# Patient Record
Sex: Male | Born: 1945 | Race: White | Hispanic: No | Smoking: Current every day smoker
Health system: Southern US, Community
[De-identification: ages and names within clinical notes are randomized; demographics above are authoritative.]

## PROBLEM LIST (undated history)

## (undated) DIAGNOSIS — E785 Hyperlipidemia, unspecified: Secondary | ICD-10-CM

## (undated) DIAGNOSIS — R7989 Other specified abnormal findings of blood chemistry: Secondary | ICD-10-CM

## (undated) DIAGNOSIS — I255 Ischemic cardiomyopathy: Secondary | ICD-10-CM

## (undated) DIAGNOSIS — Z609 Problem related to social environment, unspecified: Secondary | ICD-10-CM

## (undated) DIAGNOSIS — I350 Nonrheumatic aortic (valve) stenosis: Secondary | ICD-10-CM

## (undated) DIAGNOSIS — E119 Type 2 diabetes mellitus without complications: Secondary | ICD-10-CM

## (undated) DIAGNOSIS — Z9119 Patient's noncompliance with other medical treatment and regimen: Secondary | ICD-10-CM

## (undated) DIAGNOSIS — Z91199 Patient's noncompliance with other medical treatment and regimen due to unspecified reason: Secondary | ICD-10-CM

## (undated) DIAGNOSIS — I619 Nontraumatic intracerebral hemorrhage, unspecified: Secondary | ICD-10-CM

## (undated) DIAGNOSIS — I2510 Atherosclerotic heart disease of native coronary artery without angina pectoris: Secondary | ICD-10-CM

## (undated) DIAGNOSIS — I639 Cerebral infarction, unspecified: Secondary | ICD-10-CM

## (undated) DIAGNOSIS — I484 Atypical atrial flutter: Secondary | ICD-10-CM

## (undated) DIAGNOSIS — I10 Essential (primary) hypertension: Secondary | ICD-10-CM

## (undated) DIAGNOSIS — Z659 Problem related to unspecified psychosocial circumstances: Secondary | ICD-10-CM

## (undated) HISTORY — PX: CORONARY ARTERY BYPASS GRAFT: SHX141

## (undated) SURGERY — CYSTOSCOPY, FLEXIBLE
Anesthesia: LOCAL

---

## 2002-11-27 ENCOUNTER — Inpatient Hospital Stay (HOSPITAL_COMMUNITY): Admission: AD | Admit: 2002-11-27 | Discharge: 2002-12-10 | Payer: Self-pay | Admitting: Cardiology

## 2002-11-27 ENCOUNTER — Encounter: Payer: Self-pay | Admitting: *Deleted

## 2002-11-27 ENCOUNTER — Encounter (INDEPENDENT_AMBULATORY_CARE_PROVIDER_SITE_OTHER): Payer: Self-pay | Admitting: Cardiology

## 2002-12-13 ENCOUNTER — Emergency Department (HOSPITAL_COMMUNITY): Admission: EM | Admit: 2002-12-13 | Discharge: 2002-12-14 | Payer: Self-pay | Admitting: *Deleted

## 2002-12-16 ENCOUNTER — Emergency Department (HOSPITAL_COMMUNITY): Admission: EM | Admit: 2002-12-16 | Discharge: 2002-12-16 | Payer: Self-pay | Admitting: Emergency Medicine

## 2002-12-28 ENCOUNTER — Encounter
Admission: RE | Admit: 2002-12-28 | Discharge: 2002-12-28 | Payer: Self-pay | Admitting: Thoracic Surgery (Cardiothoracic Vascular Surgery)

## 2003-06-21 ENCOUNTER — Emergency Department (HOSPITAL_COMMUNITY): Admission: EM | Admit: 2003-06-21 | Discharge: 2003-06-21 | Payer: Self-pay | Admitting: Emergency Medicine

## 2004-07-10 ENCOUNTER — Encounter: Payer: Self-pay | Admitting: Emergency Medicine

## 2004-07-10 ENCOUNTER — Inpatient Hospital Stay (HOSPITAL_COMMUNITY): Admission: AD | Admit: 2004-07-10 | Discharge: 2004-07-14 | Payer: Self-pay | Admitting: Cardiovascular Disease

## 2005-06-18 ENCOUNTER — Ambulatory Visit (HOSPITAL_COMMUNITY): Admission: RE | Admit: 2005-06-18 | Discharge: 2005-06-18 | Payer: Self-pay | Admitting: Family Medicine

## 2005-07-22 ENCOUNTER — Emergency Department (HOSPITAL_COMMUNITY): Admission: EM | Admit: 2005-07-22 | Discharge: 2005-07-22 | Payer: Self-pay | Admitting: Emergency Medicine

## 2005-09-21 ENCOUNTER — Emergency Department (HOSPITAL_COMMUNITY): Admission: EM | Admit: 2005-09-21 | Discharge: 2005-09-21 | Payer: Self-pay | Admitting: Emergency Medicine

## 2006-01-07 ENCOUNTER — Emergency Department (HOSPITAL_COMMUNITY): Admission: EM | Admit: 2006-01-07 | Discharge: 2006-01-07 | Payer: Self-pay | Admitting: Emergency Medicine

## 2006-12-30 ENCOUNTER — Ambulatory Visit (HOSPITAL_COMMUNITY): Admission: RE | Admit: 2006-12-30 | Discharge: 2006-12-30 | Payer: Self-pay | Admitting: Family Medicine

## 2007-05-28 ENCOUNTER — Encounter: Payer: Self-pay | Admitting: Orthopedic Surgery

## 2007-06-16 ENCOUNTER — Ambulatory Visit (HOSPITAL_COMMUNITY): Admission: RE | Admit: 2007-06-16 | Discharge: 2007-06-16 | Payer: Self-pay | Admitting: Orthopaedic Surgery

## 2007-06-19 ENCOUNTER — Ambulatory Visit (HOSPITAL_COMMUNITY): Admission: RE | Admit: 2007-06-19 | Discharge: 2007-06-19 | Payer: Self-pay | Admitting: Family Medicine

## 2007-07-02 ENCOUNTER — Emergency Department (HOSPITAL_COMMUNITY): Admission: EM | Admit: 2007-07-02 | Discharge: 2007-07-02 | Payer: Self-pay | Admitting: Emergency Medicine

## 2008-07-05 ENCOUNTER — Emergency Department (HOSPITAL_COMMUNITY): Admission: EM | Admit: 2008-07-05 | Discharge: 2008-07-05 | Payer: Self-pay | Admitting: Emergency Medicine

## 2009-08-18 ENCOUNTER — Emergency Department (HOSPITAL_COMMUNITY): Admission: EM | Admit: 2009-08-18 | Discharge: 2009-08-18 | Payer: Self-pay | Admitting: Emergency Medicine

## 2009-08-19 ENCOUNTER — Emergency Department (HOSPITAL_COMMUNITY): Admission: EM | Admit: 2009-08-19 | Discharge: 2009-08-19 | Payer: Self-pay | Admitting: Emergency Medicine

## 2010-01-21 ENCOUNTER — Encounter: Payer: Self-pay | Admitting: Thoracic Surgery (Cardiothoracic Vascular Surgery)

## 2010-03-16 LAB — CBC
HCT: 41.2 % (ref 39.0–52.0)
Hemoglobin: 13.9 g/dL (ref 13.0–17.0)
MCV: 91.4 fL (ref 78.0–100.0)
RBC: 4.51 MIL/uL (ref 4.22–5.81)
RDW: 14.7 % (ref 11.5–15.5)
WBC: 10.6 10*3/uL — ABNORMAL HIGH (ref 4.0–10.5)

## 2010-03-16 LAB — COMPREHENSIVE METABOLIC PANEL
ALT: 23 U/L (ref 0–53)
Alkaline Phosphatase: 63 U/L (ref 39–117)
BUN: 15 mg/dL (ref 6–23)
CO2: 26 mEq/L (ref 19–32)
Chloride: 106 mEq/L (ref 96–112)
Glucose, Bld: 127 mg/dL — ABNORMAL HIGH (ref 70–99)
Potassium: 4 mEq/L (ref 3.5–5.1)
Sodium: 140 mEq/L (ref 135–145)
Total Bilirubin: 0.8 mg/dL (ref 0.3–1.2)
Total Protein: 6.3 g/dL (ref 6.0–8.3)

## 2010-03-16 LAB — RAPID URINE DRUG SCREEN, HOSP PERFORMED
Cocaine: POSITIVE — AB
Opiates: POSITIVE — AB
Tetrahydrocannabinol: NOT DETECTED

## 2010-03-16 LAB — POCT CARDIAC MARKERS
CKMB, poc: 2 ng/mL (ref 1.0–8.0)
Troponin i, poc: <0.05 ng/mL (ref 0.00–0.09)

## 2010-03-16 LAB — DIFFERENTIAL
Basophils Absolute: 0.1 10*3/uL (ref 0.0–0.1)
Basophils Relative: 1 % (ref 0–1)
Eosinophils Absolute: 0.6 10*3/uL (ref 0.0–0.7)
Monocytes Relative: 8 % (ref 3–12)
Neutro Abs: 7.3 10*3/uL (ref 1.7–7.7)
Neutrophils Relative %: 69 % (ref 43–77)

## 2010-03-16 LAB — URINALYSIS, ROUTINE W REFLEX MICROSCOPIC
Bilirubin Urine: NEGATIVE
Glucose, UA: NEGATIVE mg/dL
Hgb urine dipstick: NEGATIVE
Specific Gravity, Urine: 1.03 (ref 1.005–1.030)
Urobilinogen, UA: 0.2 mg/dL (ref 0.0–1.0)

## 2010-03-16 LAB — GLUCOSE, CAPILLARY: Glucose-Capillary: 149 mg/dL — ABNORMAL HIGH (ref 70–99)

## 2010-03-16 LAB — ETHANOL: Alcohol, Ethyl (B): <5 mg/dL (ref 0–10)

## 2010-04-09 LAB — GLUCOSE, CAPILLARY: Glucose-Capillary: 92 mg/dL (ref 70–99)

## 2010-05-19 NOTE — Cardiovascular Report (Signed)
NAMENASIR, BRIGHT NO.:  [id number]   MEDICAL RECORD NO.:  [id number]          PATIENT TYPE:  INP   LOCATION:  6527                         FACILITY:  [facility]   PHYSICIAN:  Nicki Guadalajara, M.D.     DATE OF BIRTH:  [birth year]   DATE OF PROCEDURE:  07/13/2004  DATE OF DISCHARGE:                              CARDIAC CATHETERIZATION   PROCEDURE:  Difficult but successful percutaneous transluminal coronary  angioplasty/stenting of the proximal right coronary artery.   INDICATIONS:  Mr. Destine Ambroise is a 65 year old gentleman who has history  of known CAD, status post CABG surgery x4 in November 2004, with a LIMA to  the LAD, vein to the diagonal, vein to the marginal, and vein to the PDA  branch of the right coronary artery.  Ejection fraction at that time was  40%.  He apparently had done relatively well but presented to [facility] on Monday, July 10, with unstable angina.  He was transferred to  [facility], where he underwent diagnostic catheterization by Dr.  Allyson Sabal and had a very difficult attempt at intervention to the right coronary  artery proximally.  His vein graft into the right was occluded.  Other  grafts were patent.  He had a poor result with a residual narrowing of at  least 60-70% due to marked vessel tortuosity, calcification and difficult  backup with inability ultimately to dilate beyond a 2.5 mm size.  The films  were reviewed with me, and Dr. Allyson Sabal had requested that I re-attempt  intervention to see if improved angiographic outcome would be able to be  obtained.   PROCEDURE:  After premedication with Versed 2 mg intravenously,the patient  prepped and draped in the usual fashion.  He had been maintained on  Integrilin and was still on Integrilin and Plavix prior to the procedure.  ACT was obtained, and 5000 units of heparin was administered.  A 7-French  system was used and the right femoral artery was punctured anteriorly  with a  7-French sheath inserted.  Initially a right 7-French ART 3.5 guide with  side holes was used.  An Asahi medium wire was advanced into the right  coronary artery, and there was significant difficulty since the wire seemed  to always go up the proximal branch, but ultimately the bend of the wire was  significantly adjusted such that it was able to navigate into the site of  the lesion.  However, with the curve on the wire, it was unable to cross the  lesion, which now appeared at least 90%.  This resulted in poor backup  despite numerous attempts.  Initially, a 2-0, 12 balloon was inserted to try  to give support for the wire to pass the lesion, but again this resulted in  poor backup.  Ultimately the guide was exchanged for a 7-French ART 4.0 with  side holes.  A long Whisper wire was then inserted and ultimately was able  to cross the proximal stenosis.  A 2.0 x 9 mm Maverick balloon was then  inserted and dilated ultimately up to 12 atmospheres.  The balloon was then  exchanged and upgraded to a 2.5 x 9 mm balloon.  The balloon was then able  to go beyond the lesion in the right coronary artery and as a result of  this, it allowed the wire to be manipulated much further down the right  coronary artery such that it was now able to extend into the PLA vessel for  more optimal backup.  Dilatation was then done at the lesion with a 2.5 x 9  mm Maverick balloon up to 10 atmospheres.  At this point, when the balloon  was exchanged, the catheter was obviously providing very poor backup and I  was unable to recannulate the vessel after the whole system had popped back  into the aorta.  The guiding catheter was then exchanged for an AL-1  catheter, which was used at the procedure by Dr. Allyson Sabal the other day.  This  was not soft initially, and consequently the vessel was able to be rewired  down to the distal aspect.  Attempt at stenting was then performed with a 3-  0 x 12 mm Taxus stent,  but this was unable to traverse the proximal area and  consequently the whole system again pulled back.  The patient received an  additional 1000 units of heparin.  At this point, the guide was placed back  in and ultimately a 3.0 x 9 mm Maverick balloon was inserted to attempt more  maximal dilatation of the lesion.  This ultimately was successful in  navigating to the lesion with dilatation up to 3.25 mm.  At this point, the  guidewire was again becoming very difficult to keep in place and ultimately  again, another AL-1 was inserted for more backup prior to attempting  stenting.  Ultimately, a 3.0 x 12 mm Taxus stent with much difficulty was  ultimately able to get to the lesion and was dilated maximally up to 16  atmospheres with the stent balloon.  Post-stent dilatation was initially  attempted with a 3.5 Quantum, but the nose cone of the balloon was  apparently trying to get hung up on the proximal curve at the proximal site  of the stent.  For this reason a Luge wire was advanced down the stent to  create a buddy system.  The balloon was then ultimately able to be placed within the stented  segment, 3.5 x 8 mm Quantum balloon, and the Luge wire was removed with the  balloon remaining over the Whisper wire.  Post-stent dilatation was done  maximally up to 12 atmospheres, corresponding to 3.5 mm size within the  stented segment.  The patient received an additional 1000 units of heparin  at the procedure.  ACT was documented to be therapeutic.  During the  procedure, the patient also received fentanyl 25 mcg x2 for some lower back  muscular discomfort.  He left the catheterization laboratory in stable  condition.   HEMODYNAMIC DATA:  Central aortic pressure was 171/90, mean 122.  During the  procedure the patient received several doses of 200 mcg of intracoronary to  glycerin.   ANGIOGRAPHIC DATA:  The right coronary artery was a large, dominant vessel that supplied that was very  tortuous and calcified proximally.  There was  approximately 40% narrowing in the region of the ostium.  There was least 90% to 95% stenosis at the site of dilatation from two days  ago, suggesting significant elastic recoil in this calcified region.  After  this proximal bend,  there was again mild 30-40% narrowings diffusely  throughout the midportion of the vessel.  The vessel gave rise to a large  PDA and large posterolateral system.  Following successful PTCA with  ultimate stenting, the 90-95% stenosis was reduced to 0% with ultimately a  3.0 x 12 mm drug-eluting paclitaxel Taxus stent being postdilated to 3.5 mm.  There was TIMI-3 flow.  There was no evidence for dissection.   IMPRESSION:  Difficult but successful percutaneous transluminal coronary  angioplasty and stenting of a 90% restenotic lesion in the proximal right  coronary artery with ultimate stenting utilizing a 3.0 x 12 mm Taxus stent  postdilated to 3.5 mm, done with Integrilin/Plavix and heparinization.       TK/MEDQ  D:  07/13/2004  T:  07/13/2004  Job:  [id number]   cc:   Orville Govern   [facility]   Kirk Ruths, M.D.  [address]  [place]  [place] [address]  Fax: [phone]

## 2010-05-19 NOTE — Discharge Summary (Signed)
NAME:  [name], [name]                       ACCOUNT NO.:  [id number]   MEDICAL RECORD NO.:  [id number]                   PATIENT TYPE:  INP   LOCATION:  2013                                 FACILITY:  [facility]   PHYSICIAN:  Salvatore Decent. Dorris Fetch, M.D.         DATE OF BIRTH:  11/02/1945   DATE OF ADMISSION:  11/27/2002  DATE OF DISCHARGE:  12/10/2002                                 DISCHARGE SUMMARY   ADMISSION DIAGNOSIS:  Unstable angina.   PAST MEDICAL HISTORY:  1. Hypertension, quit his medications prior to admission due to finances.  2. History of tobacco use.  3. Questionable history of GERD.   ALLERGIES:  No known drug allergies.   DISCHARGE DIAGNOSES:  1. Coronary artery disease with unstable angina, status post CABG.  2. Postoperative encephalopathy and small frontal hemorrhage, symptoms     resolved.  3. New onset diabetes mellitus type 2, admitting hemoglobin A1c 7.4.   BRIEF HISTORY:  [name] is a 65 year old man.  He presented to the [facility] Emergency Department on November 26 with complaints of chest pain not  relieved by Zantac.  He was given nitroglycerin in the emergency department  and this relieved his chest pain.  He was transferred to [facility]  for cardiac catheterization.   HOSPITAL COURSE:  On January 26 [name] was admitted to [facility] in the care of [facility] and [facility] cardiologist  service.  He was evaluated by Dr. Nicki Guadalajara.  Dr. Tresa Endo recommended  proceeding with cardiac catheterization.  This was performed on January 26  by Dr. Jacinto Halim and revealed severe three-vessel coronary artery disease with  impaired LV function with ejection fraction estimated to be 40%.  FE lesions  were not amenable to PCI.  Cardiac surgery consultation was requested.  He  was evaluated later in the day by Dr. Charlett Lango.  After examination  of the patient and review of the old records, Dr. Dorris Fetch agreed  that  coronary artery bypass grafting was the preferred treatment of choice in  this gentleman.  The procedure, risks and benefits were all discussed with  [name].  He agreed to proceed with surgery.   On November 27 [name] experienced some hypertensive urgency with blood  pressures as high as 184/32.  This was treated with IV nitroglycerin.  His  beta blocker was also increased.  Thyroid studies as well as hemoglobin A1c  studies were ordered.  TSH was returned at a 0.61, this is a normal range,  and his hemoglobin A1c 7.4.  This gave him a new diagnosis of diabetes  mellitus.  For the most part [name] remained stable until the surgery.   November 29 [name] underwent the following surgical procedure with Dr.  Charlett Lango:  Coronary artery bypass grafting x 4.  Grafts placed at  the time of procedure:  Left internal mammary artery graft to the left  anterior descending artery, saphenous vein graft to the diagonal artery,  saphenous vein graft to the first obtuse marginal artery, saphenous vein  graft to the posterior descending artery.  Vein was harvested from the right  thigh with the endovein harvesting technique through the lower leg with an  open surgical technique.  Significant findings at operation include massive  hypertrophy.  [name] tolerated this procedure well, transferring into stable  condition to the SICU.  He remained hemodynamically stable in the immediate  postoperative period.  He did remain sedated on the ventilator overnight, as  he was not following commands as he was waking up.  He was extubated the  morning of postoperative day 1.  [name] did remain with an altered  mental status, including agitation, and was not able to follow commands.  There were initially no focal findings.   On postoperative day 2, head CT scan was obtained as well as neuro  consultation.  Head CT revealed a small area of acute hemorrhage lateral to  the  right caudate.  He was evaluated by Dr. Orlin Hilding, who felt that his  mental status changes were probably secondary to postoperative  encephalopathy, but since there was a small area of hemorrhage, this could  also contribute to his symptoms.  The recommendation was to follow his neuro  exam for any changes and repeat a CT scan in one week.  Over the next 48  hours [name]' mental status improved greatly.   By postoperative day 4, he was more alert, oriented x 2, exam was nonfocal.  He was improving.  He was still lethargic, but there was definite  improvement.  He did begin to develop some coarse rhonchi in his chest as  well as some congestion.  He was started on a course of Avelox for  bronchitis.  Antibiotics were changed while in the intensive care unit and  his blood glucose levels were well controlled.  By the afternoon of  postoperative day 4, [name] was progressing well enough to be  transferred to unit 2000.   December 3, physical therapy evaluation was completed.  Their observation  was that he did very well with ambulation.  He had a steady gait and he was  appropriate at that time for cardiac rehab, phase I.   After transfer to unit 2000, [name] continued to progressively recover  from his surgery.  His heart maintained normal sinus rhythm.  His bronchitis  cleared after several days.  He tolerated his diet.  His incisions were  healing well.  He was evaluated on postoperative day 7 by Dr. Orlin Hilding, who  again recommended a follow-up CT scan prior to discharge.   Once [name] began taking a regular diet his blood glucose levels did  rise.  They were greater than 150 on most days.  He was evaluated by a  diabetes coordinator who recommended starting Glucotrol 5 mg q. d.  This was  started on December 7.  [name] also did have some symptoms of gastric  reflux and he was started on his home medication of Zantac.  December 9 CT scan of the head was  performed.  Review with the radiologist  revealed that a small frontal hemorrhage was resolved.  There was no new  evidence of hemorrhage.  Dr. Orlin Hilding was contacted by phone.  She felt that  [name] would be safe for discharge since his CT scan was stable.  Her  recommendation regarding antiplatelet  therapy was for either aspirin or  Plavix.  [name] was discharged home December 10, 2002.   CONDITION ON DISCHARGE:  Improved.   INSTRUCTIONS ON DISCHARGE:  1. Medications.     A. He is to resume his home medication of Zantac 75 mg daily.  2. New medications.     A. Aspirin 325 mg daily.     B. Lopressor 25 mg b.i.d.     C. Lisinopril 20 mg b.i.d.     D. Norvasc 5 mg q. d.     E. Zocor 40 mg q.h.s.     F. Diazepam 500 mg q.h.s.     G. Glipizide 5 mg q. d.     H. Lasix 40 mg q. d. for 6 days.        A. Potassium chloride 20 mEq q. d. for 6 days     I. (Case management was involved with [name] for assistance with        medications at discharge.)     J. For pain management he has Ultram 50 mg 1 to 2 p.o. q. 6 hours p.r.n.        for pain.  3. Activities - He has been asked to refrain from any driving, any heavy     lifting, pushing or pulling.  He was also instructed to continue his     previous exercises and daily walking.  4. Diet - This should continue to be a heart healthy diet.  5. Wound care - He is to shower daily.  If his incisions show any signs of     infection or fevers greater than 101 degrees Fahrenheit, he is to call     Dr. Sunday Corn office.   FOLLOW UP:  1. Dr. Tresa Endo would like to see him in his office in two weeks.  He is to     call to arrange that appointment.  2. Dr. Dorris Fetch would like to see him in the office on Monday, December     27 at 11 a.m.      Toribio Harbour, N.P.                  Salvatore Decent Dorris Fetch, M.D.    CTK/MEDQ  D:  02/23/2003  T:  02/24/2003  Job:  [id number]   cc:   Salvatore Decent. Dorris Fetch, M.D.  [address]   [place]  [place] [address]   Nicki Guadalajara, M.D.  [phone] [address], [address]  [place], [place] [address]  Fax: [phone]

## 2010-05-19 NOTE — Cardiovascular Report (Signed)
NAMEJEDADIAH, ABDALLAH NO.:  [id number]   MEDICAL RECORD NO.:  [id number]          PATIENT TYPE:  INP   LOCATION:  6527                         FACILITY:  [facility]   PHYSICIAN:  Nanetta Batty, M.D.   DATE OF BIRTH:  [birth year]   DATE OF PROCEDURE:  07/10/2004  DATE OF DISCHARGE:                              CARDIAC CATHETERIZATION   PROCEDURE:  Cardiac catheterization/percutaneous transluminal coronary  angioplasty.   INDICATION:  Mr. Dufrane is a 65 year old gentleman, status post coronary  artery bypass grafting x4, November 2004.  He also has a 4.4-cm abdominal  aortic aneurysm.  His other problems include hypertension, hyperlipidemia,  tobacco abuse and non-insulin-requiring diabetes.  He was admitted today  with progressive dyspnea.  He was awakened with shortness of breath this  morning.  He presented to [facility] and was transferred.  His EKG  did show some new ST and T wave changes.  He presents now for diagnostic  coronary arteriography.   DESCRIPTION OF PROCEDURE:  The patient was brought to the second floor [facility] in the post-absorptive state.  He was premedicated  with p.o. Valium and IV Nubain.  His right groin was prepped and shaved in  usual sterile fashion.  Xylocaine 1% was used for local anesthesia.  A 6-  French sheath was inserted into the right femoral artery using a standard  Seldinger technique.  The 6-French right and left diagnostic catheters as  well as a 6-French pigtail catheter were used for selective coronary  angiography, left ventriculography, selective IMA vein graft angiography,  left ventriculography and supravalvular aortography.  Visipaque dye was used  for the entirety of the case.  Retrograde aortic, left ventricular and  pullback pressures were recorded.   HEMODYNAMIC DATA:  1.  Aortic systolic pressure 139 and diastolic pressure 80.  2.  Left ventricular systolic pressure 144, end-diastolic  pressure 11.   SELECTIVE CORONARY ANGIOGRAPHY:  1.  Left main normal.  2.  LAD:  The LAD had a 70% lesion in the junction between the proximal and      mid-third at the takeoff of the first large septal perforator with      competitive flow in the second diagonal branch and in the distal LAD.  3.  Left circumflex:  The obtuse marginal branch was totally occluded in its      proximal portion.  4.  Coronary artery disease:  A large dominant vessel with 50% ostial      stenosis with damping and 80% proximal stenosis.  5.  Vein graft to the PDA occluded at the origin.  6.  Vein graft to the circumflex marginal patent.  7.  Vein graft to the diagonal patent.  8.  Lumen to the LAD patent.   LEFT VENTRICULOGRAPHY:  RAO left ventriculogram was performed using 25 mL of  Visipaque dye at 12 mL/sec.  The overall LVEF was estimated at approximately  50% to 55% without focal wall motion abnormalities.   SUPRAVALVULAR AORTOGRAPHY:  Supravalvular aortography was performed in the  LAO views using 20 mL of Visipaque dye  at 20 mL/second.  There was no flow  down the RCA vein graft.   IMPRESSION:  Mr. Newby has an occluded posterior descending artery vein  graft with a high-grade proximal dominant right coronary artery lesion.  His  other grafts are intact and his left ventricular function is normal.  We  plan to proceed with percutaneous coronary intervention and stenting using  IIb/IIIa inhibition.   PERCUTANEOUS CORONARY INTERVENTION PROCEDURAL NOTE:  An existing 6-French  sheath in the right femoral artery was exchanged over a wire for a 7-French  sheath.  A 6-French sheath was then inserted into the right femoral vein.  The patient had received aspirin already.  He received 5000 units of heparin  intravenously, 600 mg of p.o. Plavix and Integrilin double-bolus infusion.   Using a 7-French JR4 sidehole guide catheter along with multiple guidewires  including an ASAHI soft Whisper, a luge, and  a BMW, PTCA was performed on  the proximal right using a 2.5/12 Quantum Maverick at nominal pressures.  Following this, attempts were made to pass the CYPHER and TAXUS stent  unsuccessfully around the first bend.  Multiple wires were used in tandem  using buddy wire technique.  Ultimately, the guide was exchanged from an  AL1 7-French sidehole catheter and the cutting balloon atherectomy was  attempted with 2.75 x 6-mm-long cutting balloon; however, this was never  able to navigate the lesion and the guide flipped out of the ostium and we  lost our wire position.  The patient remained stable throughout the case.  Because of the amount of contrast used, the patient's clinical stability and  the fact that the lesion looked stable, the procedure was aborted.  The  ending ACT was 267.  The guidewire and catheters were removed.  The sheaths  were sewn securely in place.  The patient left the lab in stable condition.   He will be hydrated.  He will remain on Integrilin and the sheaths will be  removed once the ACT falls below 150.  I will review the case with my  colleagues and possibly another attempt will be made to intervene on the  proximal RCA.  The patient left the lab in stable condition.       JB/MEDQ  D:  07/10/2004  T:  07/11/2004  Job:  [id number]   cc:   [facility] 2nd Floor Cardiac Cath Lab   [address], [place], [place] [address] [facility]   Kirk Ruths, M.D.  [address]  [employer]  [place] [address]  Fax: [phone]

## 2010-05-19 NOTE — Discharge Summary (Signed)
[name], [name] NO.:  [id number]   MEDICAL RECORD NO.:  [id number]          PATIENT TYPE:  INP   LOCATION:  6527                         FACILITY:  [facility]   PHYSICIAN:  Nicki Guadalajara, M.D.     DATE OF BIRTH:  May 23, 1945   DATE OF ADMISSION:  07/10/2004  DATE OF DISCHARGE:  07/14/2004                                 DISCHARGE SUMMARY   ADMISSION DIAGNOSES:  1.  Recurrent chest pain with abnormal EKG.  2.  Status post coronary bypass grafting times four with ejection fraction      40%.  3.  Noninsulin dependent diabetes.  4.  Hypertension.  5.  Hypercholesterolemia.  6.  Chronic obstructive pulmonary disease with ongoing tobacco use.  7.  Gastroesophageal reflux disease.  8.  History of a right brain cerebrovascular accident.   DISCHARGE DIAGNOSES:  1.  Recurrent coronary artery disease status post coronary artery bypass      graft November, 2004.  2.  Percutaneous coronary intervention and stenting of the proximal right      coronary artery; occluded saphenous vein graft to posterior descending      artery.  3.  Adult onset diabetes mellitus.  4.  Chronic obstructive pulmonary disease with ongoing tobacco use.  5.  History of right brain cerebrovascular accident.  6.  History of hyperlipidemia.   PROCEDURES:  1.  Cardiac catheterization and attempted percutaneous coronary intervention      by Dr. Allyson Sabal on July 10, 2004.  2.  Repeat cath and PCI of the RCA using a TAXUS 3 x 12 mm Monorail stent,      and this was July 13, 2004.   BRIEF HISTORY:  The patient is a 65 year old white male status post CABG x4  November, 2004, with LIMA to the LAD, saphenous vein graft to diagonal,  saphenous vein graft to the OM, saphenous vein graft to the PAD.  His EF  then was 40%.  He has since been seen at [facility] for congestive  heart failure approximately 8 months ago.  Last night he had increasing  shortness of breath at rest, in the ER he had new EKG  changes with inferior  Qs.  He denies chest pain.  Symptoms improved with oxygen.   PAST MEDICAL HISTORY:  Includes:  1.  Noncompliance with his medicines.  2.  History of hypertension.  3.  Hypercholesterolemia.  4.  Peptic ulcer disease.  5.  Diabetes.  6.  Right brain CVA.  7.  Ongoing tobacco use.   MEDICATIONS ON ADMISSION:  1.  Crestor 10 mg daily.  2.  Lasix 40 mg daily.  3.  Potassium 20 mEq daily.  4.  Toprol 50 mg daily.  5.  Lotrel 10/20 daily.  6.  Glucophage 500 mg b.i.d.   ALLERGIES:  NONE KNOWN.   The patient is divorced.  He has one son.  He is a Education administrator.  He smokes 1/2  pack a day.  He denies alcohol.  He is hoping to get disability.   FAMILY HISTORY:  Positive for coronary artery disease.  For further H&P, see the dictated note.   HOSPITAL COURSE:  The patient was admitted and was seen by Dr. Allyson Sabal and  subsequently taken to the Cath Lab on July 10, 2004.  Vein graft to the PDA  was occluded at its origin, its vein graft to the circumflex marginal, vein  graft to the diagonal and lumen of the LAD were patent.  It was Dr. Hazle Coca  opinion the patient had occluded his posterior descending vein graft with a  very high grade proximal dominant RCA lesion.  His other grafts were intact.  Left ventricular function was normal.  He attempted to do a PCI of this area  but was unsuccessful and efforts were discontinued.  He was maintained on  Integrilin and transferred to the floor.  He was taken back to the Cath Lab  on July 13, 2004, at which time he had a successful PCI, angioplasty and  stenting of his proximal right coronary artery.  Patient tolerated the  procedure well, a 90% lesion was reduced and a TAXUS stent was placed.  He  tolerated the procedure well.  The following day he was wheezing some on  forced expirations, there was some ecchymosis at the cath site but overall  he was doing well, and it was Dr. Landry Dyke opinion that he could be  discharged  home.   DISCHARGE MEDS INCLUDE:  1.  Toprol XL 50 mg daily.  2.  Lasix 40 mg one daily.  3.  Potassium 20 mEq one daily.  4.  Lotrel 10/20 one daily.  5.  Glucophage 500 mg one b.i.d., start on July 15, 2004.  6.  Aspirin 325 mg daily.  7.  Plavix 75 mg daily.  8.  Pepcid 20 mg daily.  9.  He was instructed to discontinue smoking again.  10. Nitroglycerin 1/150 SL under the tongue q.5 minutes, call if he needs 3      or more.   DISCHARGE ACTIVITY:  Light for 72 hours, to maintain diabetic low fat diet  and is to call if he has any problems with his calf site.  He is to call Dr.  Regino Schultze for followup with his pulmonary problems.  Dr. Landry Dyke office  scheduled him for followup in 2 weeks in [place].   LABS:  White count on admission was 13.4, on discharge it was 12.6.  Hemoglobin on admission was 14.6 with a hematocrit of 43.  On discharge,  hemoglobin was 13 with a crit of 38.  Platelets were normal on discharge,  showing 34,000.  Electrolytes are normal, BUN is 16, creatinine is 1.2.  On  discharge, his glucoses were all elevated,  glycosylated hemoglobin A1c was 8.8.  CKs and troponins were negative.  Total cholesterol is 131, triglycerides 122, HDL was 35, LDL was 72.  TSH  was 0.251 on July 10, 2004, repeat on July 12, 2004, was 0.402.   CONDITION ON DISCHARGE:  Improved.      Eber Hong, P.A.    ______________________________  Nicki Guadalajara, M.D.    WDJ/MEDQ  D:  09/12/2004  T:  09/12/2004  Job:  [id number]   cc:   Kirk Ruths, M.D.  [address]  [place]  [place] [address]  Fax: [phone]

## 2010-05-19 NOTE — Op Note (Signed)
NAME:  [name], [name]                       ACCOUNT NO.:  [id number]   MEDICAL RECORD NO.:  [id number]                   PATIENT TYPE:  INP   LOCATION:  2306                                 FACILITY:  [facility]   PHYSICIAN:  Salvatore Decent. Dorris Fetch, M.D.         DATE OF BIRTH:  October 01, 1945   DATE OF PROCEDURE:  11/30/2002  DATE OF DISCHARGE:                                 OPERATIVE REPORT   PREOPERATIVE DIAGNOSES:  Three-vessel coronary disease with unstable angina.   POSTOPERATIVE DIAGNOSES:  Three-vessel coronary disease with unstable  angina.   PROCEDURES:  1. Median sternotomy.  2. Extracorporeal circulation.  3. Coronary artery bypass grafting x 4 (left internal mammary artery to LAD,     saphenous vein graft to second diagonal, saphenous vein graft to first     obtuse marginal, saphenous vein graft to posterior descending),     endoscopic vein harvest right leg.   SURGEON:  Salvatore Decent. Dorris Fetch, M.D.   ASSISTANT:  Toribio Harbour, N.P.   ANESTHESIA:  General.   FINDINGS:  Marked biventricular hypertrophy, diffusely diseased target  vessels, veins small but good quality, mammary good quality.   CLINICAL NOTE:  [name] is a 65 year old gentleman with a history of  tobacco abuse.  He presented with unstable angina and at catheterization,  had three-vessel coronary artery disease with an ejection fraction of 40%.  The patient had no history of diabetes but during his preoperative testing,  was found to have an elevated hemoglobin A1C.  The initial plan for  bilateral mammary arteries then was changed to plan to proceed with single  mammary and vein grafting.  In retrospect, the marked cardiac enlargement,  the right mammary would not have been long enough, even as a free graft to  reach the PD or OM vessels.  The patient's left radial artery had already  been cannulated prior to learning of the results of the hemoglobin A1C.  Mr.  Owens understood and accepted the  risks of coronary artery bypass grafting  and agreed to proceed.   OPERATIVE NOTE:  [name] was brought to the preop holding area on  November 30, 2002.  Lines were placed to monitor arterial, central venous,  and pulmonary arterial pressure by the anesthesia service.  IV antibiotics  were administered.  He was taken to the operating room, anesthetized, and  intubated.  A Foley catheter was placed, and the abdomen, chest, and legs  were prepped and draped in the usual fashion.   A median sternotomy was performed, and the left internal mammary artery was  harvested in the standard fashion.  The left internal mammary artery was a 2  mm good quality conduit.  Simultaneously, an incision was made in the medial  aspect of the right leg at the level of the knee.  The greater saphenous  vein was harvested from the thigh endoscopically and through bridged  incisions in the lower leg.  The  vein was of small caliber but good quality.  The mammary was of good quality.  The patient was fully heparinized prior to  dividing the distal into the mammary artery.   The pericardium was opened.  The ascending aorta was palpated.  There was  palpable atherosclerotic disease at the site used for cannulation.  The  cannula was moved slightly more proximal and to the right on the aorta to  allow cannulation in an area free of disease.  There was no other palpable  atherosclerotic disease in the ascending aorta.  A dual-stage venous cannula  was placed via pursestring suture in the right atrial appendage.  After  confirming adequate anticoagulation with ACT measurement, cardiopulmonary  bypass was instituted, and the patient was cooled to 32 degrees Celsius.  The coronary arteries were inspected. They were diffusely diseased.  Of  note, it was difficult even to expose the circumflex because the heart was  so massively hypertrophied.  The conduits were inspected and cut to length.  A foam pad was placed in  the pericardium to protect the left phrenic nerve.  The temperature probe was placed in the myocardial septum, and a  cardioplegia cannula was placed in the ascending aorta.   The aorta was cross-clamped.  The left ventricle was emptied via the aortic  root bank.  Cardiac arrest then was achieved with a combination of cold  antegrade blood cardioplegia and topical iced saline.  After achieving a  complete diastolic arrest to a myocardial septal temperature of 12 degrees  Celsius, the following distal anastomoses were performed.   First, a reversed saphenous vein graft was placed end-to-side to the  posterior descending and branch of the right coronary.  This was a 1.5 mm  vessel, was diffusely diseased, and only a 1 mm probe would pass distally.  The 1.5 mm probe did pass freely into the distal right coronary proximally.  The vein graft was of good quality, and the anastomosis was performed end-to-  side with a running 7-0 Prolene suture.  There was excellent flow through  the graft.  Cardioplegia was administered, and there was good hemostasis.   Next, a reverse saphenous vein graft was placed end-to-side to the second  diagonal branch of the LAD.  This was the larger of the two diagonal  branches.  It was 1.5 mm in diameter.  It was diffusely diseased but was an  adequate vessel for grafting.  The first diagonal was diffusely diseased and  not an adequate vessel for grafting.  An end-to-side anastomosis was  performed with the saphenous vein to the diagonal.  Again, the vein was of  relatively small caliber but good quality.  There was good flow through the  anastomosis.  Cardioplegia was administered.  There was good hemostasis.   Next, the heart was elevated.  Again, this was a very difficult exposure of  the left circumflex.  The OM 1 vessel was identified.  It was the large, dominant, lateral branch.  A graft was placed end-to-side to this OM 1 which  was a 2 mm vessel, but only a  1.5 mm probe would pass distally because of  diffuse disease.  The vein, again, was of good quality.  The anastomosis was  performed with a running 7-0 Prolene suture.  With administration of  cardioplegia, there was a small leak from the heel which was repaired with a  7-0 Prolene suture.   Next, the left internal mammary artery was brought through a  window in the  pericardium, and the distal end was spatulated.  It was a 2 mm good quality  conduit.  It was anastomosed end-to-side to the LAD which was a 2 mm, fair  quality, diffusely diseased target.  The anastomosis was performed with a  running 8-0 Prolene suture.  At the completion of the mammary to LAD  anastomosis, the bulldog clamp was removed from the mammary artery.  Septal  rewarming was noted.  The bulldog clamp was re-placed.  Additional  cardioplegia was administered to achieve once again adequate septal pooling.   The vein grafts were cut to length.  The cardioplegia cannula was removed  from the ascending aorta, and the proximal vein graft anastomoses were  performed to 4.0 mm punch aortotomies with running 6-0 Prolene sutures.  At  the completion of the final proximal anastomosis, the bulldog clamp was  again removed from the left mammary artery.  Rapid septal rewarming was  noted.  Lidocaine was administered.  The patient was placed in Trendelenburg  position.  The aortic root was de-aired, and then the aortic cross-clamp was  removed.  The total cross-clamp time was 83 minutes.   While the patient was being re-warmed, all proximal and distal anastomoses  were inspected for hemostasis.  Epicardial pacing wires were placed on the  right ventricle and right atrium.  After the patient had reached a core  temperature of 37 degrees Celsius, a dopamine drip was initiated.  Then the  patient was DDD paced for complete heart block.  Initially, as the patient  was being removed from bypass at half-flow, there was a poor pulse  pressure.  Therefore, the patient was placed back on bypass and allowed to rest.  A  small amount of air was nodule in the vein graft which was aspirated.  After  resting for 10 minutes, the patient then was removed from cardiopulmonary  bypass on intermediate dose dopamine drip.  He was in sinus rhythm at this  point and did not require pacing.  The patient initially was sluggish with  low cardiac output on initial check; however, after several minutes, the  heart became more vigorous, and the cardiac index was greater than 2, and  the patient remained hemodynamically stable thereafter.  The total bypass  time was 159 minutes.   A test dose of protamine was administered and was well-tolerated.  The  atrial and aortic cannulae were removed.  There remainder of the protamine  was administered without incident.  The chest with irrigated with one liter of warm normal saline containing 1 g of vancomycin.  Hemostasis was  achieved.  The pericardium was reapproximated with interrupted 3-0 silk  sutures.  It came together easily without tension.  A left pleural and two  mediastinal chest tubes were placed through separate subcostal incisions.  The sternum was closed with heavy gauge interrupted stainless steel wires.  The remainder of the incisions were closed in standard fashion with  subcuticular skin closures.  All sponge, needle, and instrument counts were  correct at the end of the procedure.  The patient was taken from the  operating room to the surgical intensive care unit in critical but stable  condition.                                               Salvatore Decent Dorris Fetch,  M.D.    SCH/MEDQ  D:  11/30/2002  T:  11/30/2002  Job:  [id number]   cc:   Nicki Guadalajara, M.D.  [phone] [address]  [place], [place] [address]  Fax: [phone]   Kirk Ruths, M.D.  [address]  [place]  [place] [address]  Fax: [phone]

## 2010-05-19 NOTE — Consult Note (Signed)
NAME:  [name], [name]                       ACCOUNT NO.:  [id number]   MEDICAL RECORD NO.:  [id number]                   PATIENT TYPE:  INP   LOCATION:  4739                                 FACILITY:  [facility]   PHYSICIAN:  Salvatore Decent. Dorris Fetch, M.D.         DATE OF BIRTH:  [birth year]   DATE OF CONSULTATION:  11/27/2002  DATE OF DISCHARGE:                                   CONSULTATION   REASON FOR CONSULTATION:  Three vessel coronary disease.   HISTORY OF PRESENT ILLNESS:  [name] is a 65 year old white male.  He  has no prior cardiac history.  He does have a long standing history of  ulcers, which has been relieved with Zantac in the past.  Wednesday he  developed substernal chest discomfort.  This was a severe pressure.  He  tried taking Zantac with no relief.  He called the EMTs and was taken to  [facility] Emergency Room.  He was given nitroglycerin which did result in  relief of his pain.  He did have any shortness of breath, nausea and  vomiting, or diaphoresis in association with the pain and it did not  radiate.  He was noted to be hypertensive and he did has a history of  hypertension but has stopped his medication due some financial difficulties.  He also had an elevated BMP level on admission.  His initial CPKs there were  a total CPK of 57, MB of 4.5.  His troponin was 0.08.  BMP was 481.  Today  he was transferred to [facility] and underwent cardiac catheterization,  where he was found to have severe three vessel disease.  His ejection  fraction was 40% with no significant mitral regurgitation.  He had inferior  apical hypokinesis.  His EDP was 18.  He did have some aortic root  dilatation, which was mild.  There was no aortic insufficiency.  He also had  aortic ectasia diffusely.   [name] is currently pain free on heparin and nitroglycerin drips.   PAST MEDICAL HISTORY:  1. Hypertension.  He stopped his medication secondary to financial concerns.  2.  History of ulcers and/or reflux treated empirically with Zantac.  3. History of tobacco abuse.  4. No prior history of hypercholesterolemia or cardiac diabetes.  He denies     diabetes or stroke.   MEDICATIONS AT THE TIME OF ADMISSION:  None.   ALLERGIES:  No known drug allergies.   FAMILY HISTORY:  Both father and a brother have had MIs.   SOCIAL HISTORY:  He is divorced.  He lives with his mother who is 10 years  old and in good health.  He has one son who lives in [place].  He does  have family in the local area.  He works as a Museum/gallery exhibitions officer.  He smoked  one-two packs a day for 40 years.  He currently is smoking approximately 1/2  pack a day.  REVIEW OF SYSTEMS:  He does have some indigestion symptoms usually relieved  by Zantac.  He has not had any excessive thirst or urination.  He has not  had any stroke or TIA symptoms.  He has not any tendency to bleed or bruise  easily.  He denies any exertional chest pain prior to this admission.  All  other systems are negative.   PHYSICAL EXAMINATION:  GENERAL:  [name] is a 65 year old white male in  no acute distress.  He is well-developed and well-nourished.  HEENT:  Remarkable for extremely poor dentition.  He has loose incisors  bilaterally.  On the upper he is missing multiple teeth.  There is  gingivitis but no obvious abscesses.  NECK:  Supple without thyromegaly or adenopathy.  LUNGS:  Equal breath sounds bilaterally.  There is no rales or wheezes.  CARDIAC:  Regular rate and rhythm.  Normal S1 and S2.  No murmurs, rubs, or  gallops.  ABDOMEN:  Soft and nontender.  EXTREMITIES:  Without clubbing, cyanosis, or edema.  He has no varicosities.  He has 2+ posterior tibial pulses bilaterally and 2+ radial pulses  bilaterally.   LABORATORY DATA:  EKG shows normal sinus rhythm with Q waves in 3 and AVF.  T wave in version in 1 and AVL.  His white count was 12.5, hematocrit 50,  platelets 286.  Sodium 136, potassium 3.7,  BUN and creatinine 15 and 1.0.  His glucose was 158 non-fasting on admission.   IMPRESSION:  [name] is a 65 year old gentleman with severe three vessel  disease who presented with unstable angina.  He currently is pain free and  stable on heparin and nitroglycerin.  Coronary artery bypass grafting is  indicated in the setting of three-vessel disease with impaired left  ventricular function for the best arrival of benefit as well relief of  symptoms.  I discussed in detail with [name] the indications, risks,  benefits, and alternative treatments.  He understands the incisions to be  used. He also understands the expected postoperative length of stay as well  as overall recuperation and return to work.  [name] understands that  the risks of surgery include but are not limited to death, stroke, MI, DVT,  PE, bleeding, possible need for transfusion, aggravation of ulcers or  reflux, increased risk for complications such as pneumonia due to his  smoking history.  He also understands that there are risks of other  infections such as wound infections as well as other organ dysfunction such  as renal or GI complications.  He understands and accepts these risks and  agrees to proceed with the surgery, which will be scheduled from Monday  morning.  Given the unstable angina, we might have to electively proceed  with dental evaluation but him on Peridex oral rinses in the interim.                                               Salvatore Decent Dorris Fetch, M.D.    SCH/MEDQ  D:  11/27/2002  T:  11/27/2002  Job:  [id number]   cc:   Nicki Guadalajara, M.D.  [phone]  [place], [place] [address]  Fax: [phone]   Kirk Ruths, M.D.  [address]  [place]  [place] [address]  Fax: [phone]

## 2010-05-19 NOTE — Cardiovascular Report (Signed)
NAME:  [name], [name]                       ACCOUNT NO.:  [id number]   MEDICAL RECORD NO.:  [id number]                   PATIENT TYPE:  INP   LOCATION:  4739                                 FACILITY:  [facility]   PHYSICIAN:  Cristy Hilts. Jacinto Halim, M.D.                  DATE OF BIRTH:  1945/08/30   DATE OF PROCEDURE:  11/27/2002  DATE OF DISCHARGE:                              CARDIAC CATHETERIZATION   PROCEDURE PERFORMED:  1. Left ventriculography.  2. Selective right and left coronary arteriography.  3. Ascending aortogram.  4. Left and right subclavian arteriography including visualization of LIMA     and RIMA.  5. Abdominal aortogram.   INDICATION:  [name] is a 65 year old Caucasian male with  history of hypertension, noncompliance with medications, continued tobacco  abuse who was admitted to [facility] with chest pain which was  nitrate responsive.  He had abnormal troponins.  He was also found to have  mild left ventricular diastolic heart failure.  Given his risk factors and  his presentation with chest pain which was nitrate responsive, he was  brought to the cardiac catheterization lab to evaluate his coronary anatomy.   Ascending aortogram was performed because of aortic atherosclerosis and  there was suggestion of aortic aneurysm.   HEMODYNAMIC DATA:  The opening left ventricular pressures were 159/12 with  end-diastolic pressure of 19 mmHg.  The aortic pressures were 159/109 with a  mean of 130 mmHg.  There was no pressure gradient across the aortic valve.   ANGIOGRAPHIC DATA:  Left ventricle:  Left ventricular systolic function was  mildly depressed with ejection fraction of 40%.  There is mild generalized  hypokinesis with inferior inferoapical severe hypokinesis.  There was apical  and apical lateral hypokinesis.  There was no mitral regurgitation.   Right coronary artery:  The right coronary artery has ostial calcification.  The RV branch has mild  thrombus burden noted in its distal segment.  The  right coronary artery is a large caliber vessel.  It is dominant vessel.  It  has diffuse moderate atherosclerotic changes throughout the RCA.  The ostium  has 40% stenosis followed by a proximal 70-80% stenosis and a proximal  tandem 90% stenosis.  It gives origin to a large RV branch in this proximal  segment and also a large conus branch.  In the mid segment of the RCA has  what appears like an ulcerated 50% stenosis.  The RCA gives origin to  moderate to large size PDA and PLV branches which have mild to moderate  atherosclerotic luminal irregularity.   Left main coronary artery:  The left main coronary artery is a large caliber  vessel.  It is mildly calcified.  Normal and ejection fraction was estimate  at 65%.  There is no significant mitral regurgitation. Left main has mild  diffuse calcification.   Circumflex coronary artery:  Circumflex coronary artery  is a large vessel  and large caliber vessel.  It gives origin to small several obtuse  marginals.  After the origin of an obtuse marginal #1, there is a long  segment of 80% stenosis.  The mid and distal circumflex itself has mild  atherosclerotic luminal irregularity.   Left anterior descending artery:  Left anterior descending artery is a large  caliber vessel and a large vessel.  It wraps around the apex.  It has lumpy,  bumpy luminal irregularity throughout.  Proximal has 40-50% stenosis.  Mid  segment has 40% stenosis and mid to distal segment has about 70-80% stenosis  tandemly.  The LAD gives origin to large diagonal one and diagonal two and  several small diagonals.  Both the D1 and D2 have ostial 50% stenosis.   ASCENDING AORTOGRAM:  Ascending aortogram revealed presence of three aortic  valve cusps.  There was no evidence of aortic regurgitation.  The aortic  root was mildly dilated.  There was no evidence of aortic dissection.   ABDOMINAL AORTOGRAM:  Abdominal  aortogram revealed moderate to severe  atherosclerotic changes with moderate aortic ectasia noted throughout.  There was moderate amount of calcific burden.  There were two renal  arteries; one on either side.  The left renal artery has 30-40% stenosis.  The left aortic iliac bifurcation had 20-30% stenosis.   LEFT SUBCLAVIAN AND LEFT INTERNAL MAMMARY ARTERY:  Left subclavian artery  has proximal 30% stenosis.  There was no pressure gradient across this  stenosis.  The LIMA was widely patent.   RIGHT INNOMINATE ARTERY:  Right innominate artery was widely patent.  The  RIMA was widely patent.   IMPRESSION:  1. Mild-to-moderate reduction in left ventricular systolic function with     ejection fraction of 40% with generalized hypokinesis.  2. There is inferior, inferoapical and apical lateral severe hypokinesis.  3. Calcification of the ostium of the right coronary artery with 70% and a     tandem 90% stenosis in proximal right coronary artery.  Small amount of     thrombus noted in the right ventricular branch of the right coronary     artery.  The mid RCA has 50% stenosis and appears to have a ruptured     plaque.  4. Circumflex has a proximal to mid long segment 80% stenosis.  5. The LAD has moderate diffuse luminal irregularity constituting 20-40%     stenosis.  The mid LAD has 70-80% stenosis.  The LAD gives origin to     large D1 and D2 with have ostial 50% stenosis.  6. Aortic root is mildly dilated.  7. Abdominal  aorta shows moderate atherosclerotic changes with  moderate     ectasia.   RECOMMENDATIONS:  Based on the coronary anatomy, the patient will be  referred for coronary artery bypass grafting.  Aggressive risk factor  modification is again indicated.  Smoking  cessation is indicated.   TECHNIQUE OF PROCEDURE:  Under usual sterile precautions, using a 6 French right femoral arterial access, a 6 [place] multipurpose pigtail catheter was  advanced into the ascending aorta  over a 0.035-inch J wire.  The catheter  was then gently advanced to the left ventricle and left ventricular  pressures were monitored.  Hand contrast injection of the left ventricle was  performed both in the LAO and RAO projection.  The catheter was flushed with  saline and pulled back into the ascending aorta and pressure gradient across  the aortic valve was  monitored.  The right coronary artery was selectively  engaged and angiography was performed.  Then, the catheter was pulled out of  the body in the usual fashion and a 6 French Judkins left 5 catheter was  utilized to engage the left main coronary artery and angiography was  repeated.  Then, the catheter was pulled out of the body and the multiple  purpose pigtail catheter was reintroduced into the arch of the aorta.  Right  innominate and left subclavian arteriography was performed. Then, the  catheter was pulled back into the abdominal aorta and abdominal aortogram  was performed.  Because of inadequate visualization and because of diffuse  ectasia and possibility for abdominal aortic aneurysm, the catheter was  pulled out and a 6 French straight pigtail catheter was utilized to perform  the LV gram in the RAO projection using 30 mL of contrast at rate of 10 mL  per hour and the same catheter was utilized to perform abdominal  aortogram at the rate of 15 mL per second for 2 seconds.  After performing  the abdominal aortogram, the catheter was  pulled out of body in the usual  fashion.  The patient tolerated the procedure well. No complications were  noted.  CVTS was called in.                                               Cristy Hilts. Jacinto Halim, M.D.    Pilar Plate  D:  11/27/2002  T:  11/27/2002  Job:  [id number]   cc:   Nicki Guadalajara, M.D.  [phone] [address], [address]  [place], [place] [address]  Fax: [phone]

## 2010-05-19 NOTE — H&P (Signed)
NAMEAYREN, ZUMBRO NO.:  [id number]   MEDICAL RECORD NO.:  [id number]          PATIENT TYPE:  INP   LOCATION:  6527                         FACILITY:  [facility]   PHYSICIAN:  Nicki Guadalajara, M.D.     DATE OF BIRTH:  [birth year]   DATE OF ADMISSION:  07/10/2004  DATE OF DISCHARGE:                                HISTORY & PHYSICAL   CHIEF COMPLAINT:  Chest pain.   HISTORY OF PRESENT ILLNESS:  Mr. Hawkes is a 65 year old male with a  history of coronary artery disease.  He had bypass surgery x4 in November  2004 by Dr. Dorris Fetch.  He had an LIMA to the LAD, SVG to the diagonal,  SVG to the OM-1, and SVG to the PDA.  Echo in January 2005 showed is EF to  be 40-45%.  He has been lost to followup.  Apparently his employment  situation was not good and he could not afford to come back and see Dr.  Tresa Endo.  He was seen in the emergency room about eight months ago and told  that he had congestive heart failure.  He has not had problems since.  His  medications have been adjusted by Dr. Regino Schultze.  About a week ago, he started  painting again because he needed the money.  He noted dyspnea on exertion.  Last night he had dyspnea at rest and came to [facility] ER.  He  denies any chest pain or chest tightness.  Symptoms were improved with  oxygen in the emergency room.  His EKG at [facility] showed EKG  changes with inferior Q's.  He is transferred now from [facility]  ER for further evaluation.   PAST MEDICAL HISTORY:  1.  Remarkable for hypertension.  2.  Hyperlipidemia.  3.  Non-insulin-dependent diabetes mellitus.  4.  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  5.  He has had previous problems with noncompliance because of finances.  He      is now on Medicaid.   CURRENT MEDICATIONS:  1.  Crestor 10 mg a day.  2.  Lasix 40 mg a day.  3.  Potassium 20 mEq a day.  4.  Toprol-XL 50 mg a day.  5.  Lotrel 10/20 daily.  6.  Glucophage 500 mg b.i.d.   ALLERGIES:  No known drug allergies.   SOCIAL HISTORY:  He is divorced.  He has one son.  He still smokes half a  pack a day.  He lives with his 5 year old mother.   FAMILY HISTORY:  Remarkable for coronary artery disease in his father and  brother.   REVIEW OF SYSTEMS:  Essentially unremarkable except where noted above.  Denies any fever or chills.  He has not had melena or GI bleeding.  He does  have a past history of peptic ulcer disease but says he has not had to take  Zantac since his bypass surgery.   PHYSICAL EXAMINATION:  VITAL SIGNS:  Blood pressure 142/80,  pulse 80,  respirations 15, O2 saturation 100%.  GENERAL:  Well-developed, overweight male in no acute distress.  HEENT:  Normocephalic.  Extraocular movements intact.  Sclerae nonicteric.  Lids and conjunctivae within normal limits.  NECK:  Without JVD and without bruit.  CHEST:  Clear to auscultation and percussion.  CARDIAC:  Regular rate and rhythm without murmur, rub, or gallop.  Normal S1  and S2.  ABDOMEN:  Nontender.  No hepatosplenomegaly.  No bruit.  EXTREMITIES:  Without edema.  Distal pulses are 2+/4.  NEUROLOGIC:  Grossly intact.  He is awake, alert and oriented, and  cooperative.  Moves all extremities without obvious deficit.   LABORATORY DATA:  His EKG shows sinus rhythm with new inferior Q waves and  inferolateral T wave inversion.  Renal function at [facility] shows  sodium 137, potassium 3.7, BUN 16, creatinine 1.2.  BNP was 51.9.  Chest CT  scan done at [facility] showed no pulmonary embolism but he did  have esophageal thickening and a 4.4 cm abdominal aneurysm.   IMPRESSION:  1.  Unstable angina.  2.  Abnormal EKG.  3.  Coronary artery bypass grafting x4 in November 2004.  4.  Ejection fraction of 40%.  5.  Non-insulin-dependent diabetes mellitus.  6.  Treated hypertension.  7.  Treated hyperlipidemia.  8.  History of smoking.  9.   Gastroesophageal reflux disease with an abnormal esophagus on CT scan      with distal thickening.  10. 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  11. Incidental finding of a 4.4 cm abdominal aortic aneurysm on CT scan.   PLAN:  The patient was seen by Dr. Allyson Sabal and myself today.  He will be  continued on heparin. Glucophage will be held.  He will be set up for  diagnostic catheterization today.  He will need GI followup as an outpatient  for his abnormal CT scan and also need followup of his aneurysm.      Antonieta Loveless  D:  07/10/2004  T:  07/10/2004  Job:  [id number]   cc:   Kirk Ruths, M.D.  [address]  [place]  [place] [address]  Fax: [phone]

## 2010-08-25 ENCOUNTER — Other Ambulatory Visit: Payer: Self-pay

## 2010-08-25 ENCOUNTER — Emergency Department (HOSPITAL_COMMUNITY): Payer: PRIVATE HEALTH INSURANCE

## 2010-08-25 ENCOUNTER — Emergency Department (HOSPITAL_COMMUNITY)
Admission: EM | Admit: 2010-08-25 | Discharge: 2010-08-25 | Disposition: A | Discharge status: Home / Self Care | Payer: PRIVATE HEALTH INSURANCE | Attending: Emergency Medicine | Admitting: Emergency Medicine

## 2010-08-25 DIAGNOSIS — R51 Headache: Secondary | ICD-10-CM | POA: Insufficient documentation

## 2010-08-25 DIAGNOSIS — W010XXA Fall on same level from slipping, tripping and stumbling without subsequent striking against object, initial encounter: Secondary | ICD-10-CM | POA: Insufficient documentation

## 2010-08-25 DIAGNOSIS — S0010XA Contusion of unspecified eyelid and periocular area, initial encounter: Secondary | ICD-10-CM | POA: Insufficient documentation

## 2010-08-25 DIAGNOSIS — F17200 Nicotine dependence, unspecified, uncomplicated: Secondary | ICD-10-CM | POA: Insufficient documentation

## 2010-08-25 DIAGNOSIS — E119 Type 2 diabetes mellitus without complications: Secondary | ICD-10-CM | POA: Insufficient documentation

## 2010-08-25 DIAGNOSIS — R918 Other nonspecific abnormal finding of lung field: Secondary | ICD-10-CM

## 2010-08-25 DIAGNOSIS — I2510 Atherosclerotic heart disease of native coronary artery without angina pectoris: Secondary | ICD-10-CM | POA: Insufficient documentation

## 2010-08-25 DIAGNOSIS — E785 Hyperlipidemia, unspecified: Secondary | ICD-10-CM | POA: Insufficient documentation

## 2010-08-25 DIAGNOSIS — T07XXXA Unspecified multiple injuries, initial encounter: Secondary | ICD-10-CM

## 2010-08-25 DIAGNOSIS — I509 Heart failure, unspecified: Secondary | ICD-10-CM | POA: Insufficient documentation

## 2010-08-25 DIAGNOSIS — Z951 Presence of aortocoronary bypass graft: Secondary | ICD-10-CM | POA: Insufficient documentation

## 2010-08-25 DIAGNOSIS — Y92009 Unspecified place in unspecified non-institutional (private) residence as the place of occurrence of the external cause: Secondary | ICD-10-CM | POA: Insufficient documentation

## 2010-08-25 DIAGNOSIS — R222 Localized swelling, mass and lump, trunk: Secondary | ICD-10-CM | POA: Insufficient documentation

## 2010-08-25 HISTORY — DX: Hyperlipidemia, unspecified: E78.5

## 2010-08-25 LAB — CBC
Hemoglobin: 15.9 g/dL (ref 13.0–17.0)
MCH: 29.4 pg (ref 26.0–34.0)
MCHC: 33.9 g/dL (ref 30.0–36.0)
MCV: 86.9 fL (ref 78.0–100.0)
RBC: 5.4 MIL/uL (ref 4.22–5.81)

## 2010-08-25 LAB — COMPREHENSIVE METABOLIC PANEL
BUN: 14 mg/dL (ref 6–23)
Calcium: 9.7 mg/dL (ref 8.4–10.5)
Creatinine, Ser: 1.28 mg/dL (ref 0.50–1.35)
GFR calc Af Amer: >60 mL/min (ref >60)
Glucose, Bld: 88 mg/dL (ref 70–99)
Total Protein: 7.6 g/dL (ref 6.0–8.3)

## 2010-08-25 LAB — DIFFERENTIAL
Basophils Relative: 1 % (ref 0–1)
Eosinophils Absolute: 0.2 10*3/uL (ref 0.0–0.7)
Eosinophils Relative: 3 % (ref 0–5)
Lymphs Abs: 1.8 10*3/uL (ref 0.7–4.0)
Monocytes Relative: 13 % — ABNORMAL HIGH (ref 3–12)

## 2010-08-25 LAB — GLUCOSE, CAPILLARY
Glucose-Capillary: 77 mg/dL (ref 70–99)
Glucose-Capillary: 196 mg/dL — ABNORMAL HIGH (ref 70–99)

## 2010-08-25 MED ORDER — IOHEXOL 300 MG/ML  SOLN: 64.0000 mL | Freq: Once | INTRAMUSCULAR | Status: DC | PRN

## 2010-08-25 MED ORDER — OXYCODONE-ACETAMINOPHEN 5-325 MG PO TABS: 2.0000 | ORAL_TABLET | Freq: Once | ORAL | Status: DC

## 2010-08-25 MED ORDER — POTASSIUM CHLORIDE CRYS ER 20 MEQ PO TBCR
20.0000 meq | EXTENDED_RELEASE_TABLET | Freq: Once | ORAL | Status: AC
  Administered 2010-08-25: 20 meq via ORAL
  Filled 2010-08-25: qty 1

## 2010-08-25 NOTE — ED Notes (Signed)
Pt states he walked outside to "use the restroom"  And tripped over a stump, fell to the ground, hit left side of head/orbit.  Pt denies loc, states he also hit his left ribcage.   Pt denies neck or back pain

## 2010-08-25 NOTE — ED Provider Notes (Signed)
Date: 08/25/2010  Rate: 68  Rhythm: normal sinus rhythm and premature atrial contractions (PAC)  QRS Axis: left  Intervals: normal  ST/T Wave abnormalities: normal  Conduction Disutrbances:right bundle branch block  Narrative Interpretation:   Old EKG Reviewed: changes noted  Pts low blood glucose adressed, repeat blood glucose above 100, pt told of xray results and understands the importance to see his doctor about his lung cancer diagnosis  Toy Baker, MD 08/25/10 1425

## 2010-08-25 NOTE — ED Provider Notes (Signed)
History     CSN: [id number] Arrival date & time: 08/25/2010  5:40 AM  Chief Complaint  Patient presents with  . Fall   Patient is a 65 y.o. male presenting with fall. The history is provided by the patient.  Fall The accident occurred less than 1 hour ago. Incident: Patient had to urinate. One bathroom in the home was being used so  he went outside. He tripped over a root and fell, htitting his left forehead on the ground. No LOC. He fell from a height of 3 to 5 ft. He landed on grass. The point of impact was the head. The pain is present in the head. The pain is mild. He was ambulatory at the scene. There was no entrapment after the fall. There was no drug use involved in the accident. There was no alcohol use involved in the accident. Pertinent negatives include no visual change, no fever, no numbness, no nausea, no vomiting, no headaches, no hearing loss and no loss of consciousness. He has tried nothing for the symptoms.    Past Medical History  Diagnosis Date  . CHF (congestive heart failure)   . Coronary artery disease   . Diabetes mellitus   . Hyperlipidemia     Past Surgical History  Procedure Date  . Coronary angioplasty with stent placement     History reviewed. No pertinent family history.  History  Substance Use Topics  . Smoking status: Current Everyday Smoker  . Smokeless tobacco: Not on file  . Alcohol Use: Yes      Review of Systems  Constitutional: Negative for fever.  Gastrointestinal: Negative for nausea and vomiting.  Skin:       Periorbital swelling to left side. Hematoma to left eyebrow.  Neurological: Negative for loss of consciousness, numbness and headaches.  All other systems reviewed and are negative.    Physical Exam  BP 113/62  Pulse 68  Temp(Src) 98.1 F (36.7 C) (Oral)  Resp 20  Ht 5\' 10"  (1.778 m)  Wt 187 lb (84.823 kg)  BMI 26.83 kg/m2  SpO2 97%  Physical Exam  Nursing note and vitals reviewed. Constitutional: He is oriented  to person, place, and time. He appears well-developed and well-nourished.       dishevled  HENT:  Head: Normocephalic.       Periorbital edema left.no step off, no crepitus, no deformity. edentulous  Eyes: Conjunctivae and EOM are normal. Pupils are equal, round, and reactive to light. Right eye exhibits no discharge and no exudate. Left eye exhibits no discharge and no exudate.       Visual acuity (opatient does not have his glasses)R 20/40, L 20/50  Neck: Normal range of motion.  Cardiovascular: Normal rate, normal heart sounds and intact distal pulses.        S/p CABG with well healed stenotomy scar.  Pulmonary/Chest: Effort normal.  Abdominal: Soft.  Musculoskeletal: Normal range of motion.  Neurological: He is alert and oriented to person, place, and time. No cranial nerve deficit. Coordination normal.    ED Course  Procedures   Pt presented s/p fall with periorbital swelling to left and hematoma. No LOC. No preceding dizziness.Patient is a diabetic, arrival l glucose was 77. He was given a meal. Pt stable in ED with no significant deterioration in condition.  4098 Spoke with Dr. Tyron Russell, radiologist. CT head, maxilofacial and cervical spine without bony abnormalities, hemorrhage, mass effect. Periorbital hematoma without fracture. Incidental finding on cervical spine CT of possible spiculated  mass in the lung. Will order labs and CT chest. Care/disposition to Dr. Freida Busman. MDM Reviewed previous: x-ray Interpretation: x-ray and CT scan       Nicoletta Dress. Colon Branch, MD 08/25/10 [phone]

## 2010-08-25 NOTE — ED Notes (Signed)
Pt given 8 ounces of orange juice to drink at this time for low blood sugar.

## 2010-10-23 ENCOUNTER — Encounter (HOSPITAL_COMMUNITY): Payer: Self-pay | Admitting: *Deleted

## 2010-10-23 ENCOUNTER — Emergency Department (HOSPITAL_COMMUNITY)
Admission: EM | Admit: 2010-10-23 | Discharge: 2010-10-23 | Disposition: A | Discharge status: Home / Self Care | Payer: PRIVATE HEALTH INSURANCE | Attending: Emergency Medicine | Admitting: Emergency Medicine

## 2010-10-23 DIAGNOSIS — F17200 Nicotine dependence, unspecified, uncomplicated: Secondary | ICD-10-CM | POA: Insufficient documentation

## 2010-10-23 DIAGNOSIS — I10 Essential (primary) hypertension: Secondary | ICD-10-CM | POA: Insufficient documentation

## 2010-10-23 DIAGNOSIS — Z9861 Coronary angioplasty status: Secondary | ICD-10-CM | POA: Insufficient documentation

## 2010-10-23 DIAGNOSIS — Z951 Presence of aortocoronary bypass graft: Secondary | ICD-10-CM | POA: Insufficient documentation

## 2010-10-23 DIAGNOSIS — Z79899 Other long term (current) drug therapy: Secondary | ICD-10-CM | POA: Insufficient documentation

## 2010-10-23 DIAGNOSIS — E119 Type 2 diabetes mellitus without complications: Secondary | ICD-10-CM | POA: Insufficient documentation

## 2010-10-23 DIAGNOSIS — M545 Low back pain, unspecified: Secondary | ICD-10-CM | POA: Insufficient documentation

## 2010-10-23 DIAGNOSIS — I509 Heart failure, unspecified: Secondary | ICD-10-CM | POA: Insufficient documentation

## 2010-10-23 DIAGNOSIS — I2510 Atherosclerotic heart disease of native coronary artery without angina pectoris: Secondary | ICD-10-CM | POA: Insufficient documentation

## 2010-10-23 DIAGNOSIS — E785 Hyperlipidemia, unspecified: Secondary | ICD-10-CM | POA: Insufficient documentation

## 2010-10-23 LAB — URINALYSIS, ROUTINE W REFLEX MICROSCOPIC
Bilirubin Urine: NEGATIVE
Glucose, UA: NEGATIVE mg/dL
Nitrite: NEGATIVE
Specific Gravity, Urine: 1.025 (ref 1.005–1.030)
pH: 5.5 (ref 5.0–8.0)

## 2010-10-23 MED ORDER — CYCLOBENZAPRINE HCL 10 MG PO TABS: 10.0000 mg | ORAL_TABLET | Freq: Two times a day (BID) | ORAL | Status: AC | PRN

## 2010-10-23 MED ORDER — HYDROCODONE-ACETAMINOPHEN 5-325 MG PO TABS: 1.0000 | ORAL_TABLET | ORAL | Status: AC | PRN

## 2010-10-23 MED ORDER — MORPHINE SULFATE 10 MG/ML IJ SOLN
10.0000 mg | Freq: Once | INTRAMUSCULAR | Status: AC
  Administered 2010-10-23: 10 mg via INTRAMUSCULAR
  Filled 2010-10-23: qty 1

## 2010-10-23 NOTE — ED Notes (Signed)
Pt states lower back pain with hx of the same. Pt states his back has never hurt this bad before.

## 2010-10-23 NOTE — ED Notes (Signed)
Pt up and ambulated with bathroom with no difficulty.

## 2010-10-23 NOTE — ED Provider Notes (Addendum)
History   This chart was scribed for Gerald Hutching, MD by Clarita Crane. The patient was seen in room APA01/APA01 and the patient's care was started at 3:18PM.   CSN: [id number] Arrival date & time: 10/23/2010  1:00 PM   First MD Initiated Contact with Patient 10/23/10 1504      Chief Complaint  Patient presents with  . Back Pain    (Consider location/radiation/quality/duration/timing/severity/associated sxs/prior treatment) HPI UMAIR Owens is a 65 y.o. male who presents to the Emergency Department complaining of constant moderate left lower back pain onset 3 days ago and persistent since with no associated symptoms. Denies injury/trauma, dysuria, diarrhea, decreased appetite. Patient reports back pain is aggravated by movement and palpation and relieved by nothing. Patient with h/o CABG, CHF, CAD, diabetes, hyperlipidemia, hypertension.   Past Medical History  Diagnosis Date  . CHF (congestive heart failure)   . Coronary artery disease   . Diabetes mellitus   . Hyperlipidemia   . Hypertension     Past Surgical History  Procedure Date  . Coronary angioplasty with stent placement   . Coronary artery bypass graft     No family history on file.  History  Substance Use Topics  . Smoking status: Current Everyday Smoker  . Smokeless tobacco: Not on file  . Alcohol Use: Yes     Pt states he has not drank in 8-10 mo.      Review of Systems 10 Systems reviewed and are negative for acute change except as noted in the HPI.  Allergies  Benadryl  Home Medications   Current Outpatient Rx  Name Route Sig Dispense Refill  . ALPRAZOLAM 1 MG PO TABS Oral Take 1 mg by mouth at bedtime as needed. For anxiety     . AMLODIPINE BESY-BENAZEPRIL HCL 10-20 MG PO CAPS Oral Take 1 capsule by mouth daily.      . ASPIRIN 81 MG PO TABS Oral Take 81 mg by mouth daily.      . ASPIRIN EC 81 MG PO TBEC Oral Take 81 mg by mouth daily.      Marland Kitchen FLUTICASONE PROPIONATE 50 MCG/ACT NA SUSP Nasal  Place 2 sprays into the nose daily as needed. congestion    . GLIPIZIDE 5 MG PO TABS Oral Take 5 mg by mouth daily.      Marland Kitchen METFORMIN HCL 500 MG PO TABS Oral Take 500 mg by mouth 2 (two) times daily with a meal.      . METOPROLOL SUCCINATE 50 MG PO TB24 Oral Take 50 mg by mouth daily.      Marland Kitchen NIACIN (ANTIHYPERLIPIDEMIC) 500 MG PO TBCR Oral Take 500 mg by mouth at bedtime.      Marland Kitchen PANTOPRAZOLE SODIUM 40 MG PO TBEC Oral Take 40 mg by mouth daily.      Marland Kitchen PROMETHAZINE HCL 25 MG PO TABS Oral Take 25 mg by mouth every 6 (six) hours as needed. Nausea/vomiting     . ROSUVASTATIN CALCIUM 20 MG PO TABS Oral Take 20 mg by mouth daily.      Marland Kitchen AMLODIPINE BESYLATE PO Oral Take by mouth.     Marland Kitchen GLIPIZIDE PO Oral Take by mouth.      . METFORMIN HCL PO Oral Take by mouth.      . TOPROL XL PO Oral Take by mouth.      Marland Kitchen NIASPAN PO Oral Take by mouth.      . OXYCODONE-ACETAMINOPHEN 10-325 MG PO TABS Oral Take 1 tablet by  mouth every 4 (four) hours as needed. For pain     . PROMETHAZINE HCL PO Oral Take by mouth.      . CRESTOR PO Oral Take by mouth.        BP 149/101  Pulse 79  Temp(Src) 97.3 F (36.3 C) (Oral)  Resp 18  Ht 6' (1.829 m)  Wt 185 lb (83.915 kg)  BMI 25.09 kg/m2  SpO2 96%  Physical Exam  Nursing note and vitals reviewed. Constitutional: He is oriented to person, place, and time. He appears well-developed and well-nourished. No distress.  HENT:  Head: Normocephalic and atraumatic.  Eyes: EOM are normal. Pupils are equal, round, and reactive to light.  Neck: Neck supple. No tracheal deviation present.  Cardiovascular: Normal rate and regular rhythm.  Exam reveals no gallop and no friction rub.   No murmur heard. Pulmonary/Chest: Effort normal and breath sounds normal. No respiratory distress.       Midline vertical scar c/w CABG.   Abdominal: He exhibits no distension.  Musculoskeletal: Normal range of motion. He exhibits no edema.       Minimal tenderness to palpation over left  paraspinal region.  Neurological: He is alert and oriented to person, place, and time. No sensory deficit.  Skin: Skin is warm and dry.  Psychiatric: He has a normal mood and affect. His behavior is normal.    ED Course  Procedures (including critical care time)  DIAGNOSTIC STUDIES: Oxygen Saturation is 96% on room air, normal by my interpretation.    COORDINATION OF CARE:     Labs Reviewed  URINALYSIS, ROUTINE W REFLEX MICROSCOPIC   No results found.   No diagnosis found.    MDM  Patient complains of left lower back pain. No trauma. No dysuria, fever or chills. Urine sample normal. We'll prescribe pain medication and muscle    I personally performed the services described in this documentation, which was scribed in my presence. The recorded information has been reviewed and considered.      Gerald Hutching, MD 10/23/10 1636  Gerald Hutching, MD 10/23/10 [phone]

## 2010-11-16 ENCOUNTER — Emergency Department (HOSPITAL_COMMUNITY): Payer: PRIVATE HEALTH INSURANCE

## 2010-11-16 ENCOUNTER — Encounter (HOSPITAL_COMMUNITY): Payer: Self-pay | Admitting: *Deleted

## 2010-11-16 ENCOUNTER — Emergency Department (HOSPITAL_COMMUNITY)
Admission: EM | Admit: 2010-11-16 | Discharge: 2010-11-16 | Disposition: A | Discharge status: Home / Self Care | Payer: PRIVATE HEALTH INSURANCE | Attending: Emergency Medicine | Admitting: Emergency Medicine

## 2010-11-16 DIAGNOSIS — F17200 Nicotine dependence, unspecified, uncomplicated: Secondary | ICD-10-CM | POA: Insufficient documentation

## 2010-11-16 DIAGNOSIS — Z79899 Other long term (current) drug therapy: Secondary | ICD-10-CM | POA: Insufficient documentation

## 2010-11-16 DIAGNOSIS — M8448XA Pathological fracture, other site, initial encounter for fracture: Secondary | ICD-10-CM | POA: Insufficient documentation

## 2010-11-16 DIAGNOSIS — S22000A Wedge compression fracture of unspecified thoracic vertebra, initial encounter for closed fracture: Secondary | ICD-10-CM

## 2010-11-16 DIAGNOSIS — M549 Dorsalgia, unspecified: Secondary | ICD-10-CM | POA: Insufficient documentation

## 2010-11-16 LAB — URINALYSIS, ROUTINE W REFLEX MICROSCOPIC
Hgb urine dipstick: NEGATIVE
Nitrite: NEGATIVE
Specific Gravity, Urine: 1.015 (ref 1.005–1.030)
Urobilinogen, UA: 0.2 mg/dL (ref 0.0–1.0)

## 2010-11-16 MED ORDER — MORPHINE SULFATE 4 MG/ML IJ SOLN
2.0000 mg | Freq: Once | INTRAMUSCULAR | Status: AC
  Administered 2010-11-16: 2 mg via INTRAMUSCULAR
  Filled 2010-11-16: qty 1

## 2010-11-16 MED ORDER — MORPHINE SULFATE 10 MG/ML IJ SOLN: 6.0000 mg | Freq: Once | INTRAMUSCULAR | Status: DC

## 2010-11-16 MED ORDER — MORPHINE SULFATE 10 MG/ML IJ SOLN
6.0000 mg | Freq: Once | INTRAMUSCULAR | Status: AC
  Administered 2010-11-16: 6 mg via INTRAMUSCULAR
  Filled 2010-11-16: qty 1

## 2010-11-16 MED ORDER — HYDROCODONE-ACETAMINOPHEN 5-325 MG PO TABS: 1.0000 | ORAL_TABLET | ORAL | Status: AC | PRN

## 2010-11-16 MED ORDER — ONDANSETRON 8 MG PO TBDP
8.0000 mg | ORAL_TABLET | Freq: Once | ORAL | Status: AC
  Administered 2010-11-16: 8 mg via ORAL
  Filled 2010-11-16: qty 1

## 2010-11-16 NOTE — ED Notes (Signed)
Pt concerned that 6mg  morphine not going to be enough. Pt states "they gave me 10mg  last time."

## 2010-11-16 NOTE — ED Notes (Signed)
Pt given discharge instructions, paperwork & prescription(s), pt verbalized understanding.   

## 2010-11-16 NOTE — ED Notes (Signed)
Pt reports chronic back pain, has become worse over the last 3 days. Denies any current injury.

## 2010-11-16 NOTE — ED Notes (Signed)
Pt sleep in wheelchair when entered the room. Pt states pain is not any better at this time & requesting another 2mg  of morphine. Pa J Idol notified & in room to see pt at this time.

## 2010-11-16 NOTE — ED Notes (Signed)
Pt complains of back pain for 3 weeks.

## 2010-11-18 NOTE — ED Provider Notes (Signed)
History     CSN: [id number] Arrival date & time: 11/16/2010  7:00 PM   First MD Initiated Contact with Patient 11/16/10 1904      Chief Complaint  Patient presents with  . Back Pain    (Consider location/radiation/quality/duration/timing/severity/associated sxs/prior treatment) Patient is a 65 y.o. male presenting with back pain. The history is provided by the patient.  Back Pain  This is a new problem. Episode onset: He reports sudden onset of pain in his lower back starting 3 weeks ago when he was standing from seated position.  He felt a pop in his back at the time.  The problem occurs constantly. The problem has been gradually worsening. Associated with: He may have fallen about a month before the pain started,  but denies pain prior to 3 weeks ago. The pain is present in the lumbar spine. The quality of the pain is described as aching. The pain does not radiate. The pain is at a severity of 10/10. The pain is severe. The symptoms are aggravated by bending and certain positions. The pain is the same all the time. Pertinent negatives include no chest pain, no fever, no numbness, no headaches, no abdominal pain, no abdominal swelling, no bowel incontinence, no dysuria, no paresthesias, no paresis, no tingling and no weakness. He has tried analgesics for the symptoms. The treatment provided moderate relief.    Past Medical History  Diagnosis Date  . CHF (congestive heart failure)   . Coronary artery disease   . Diabetes mellitus   . Hyperlipidemia   . Hypertension     Past Surgical History  Procedure Date  . Coronary angioplasty with stent placement   . Coronary artery bypass graft     History reviewed. No pertinent family history.  History  Substance Use Topics  . Smoking status: Current Some Day Smoker  . Smokeless tobacco: Not on file  . Alcohol Use: No     Pt states he has not drank in 2 years      Review of Systems  Constitutional: Negative for fever.  HENT:  Negative for congestion, sore throat and neck pain.   Eyes: Negative.   Respiratory: Negative for chest tightness and shortness of breath.   Cardiovascular: Negative for chest pain.  Gastrointestinal: Negative for nausea, abdominal pain and bowel incontinence.  Genitourinary: Negative.  Negative for dysuria.  Musculoskeletal: Positive for back pain. Negative for joint swelling and arthralgias.  Skin: Negative.  Negative for rash and wound.  Neurological: Negative for dizziness, tingling, weakness, light-headedness, numbness, headaches and paresthesias.  Hematological: Negative.   Psychiatric/Behavioral: Negative.     Allergies  Benadryl  Home Medications   Current Outpatient Rx  Name Route Sig Dispense Refill  . ALPRAZOLAM 1 MG PO TABS Oral Take 1 mg by mouth every 6 (six) hours as needed. For anxiety    . AMLODIPINE BESY-BENAZEPRIL HCL 10-20 MG PO CAPS Oral Take 1 capsule by mouth daily.      . ASPIRIN EC 81 MG PO TBEC Oral Take 81 mg by mouth daily.      Marland Kitchen FLUTICASONE PROPIONATE 50 MCG/ACT NA SUSP Nasal Place 2 sprays into the nose daily as needed. congestion    . GLIPIZIDE 5 MG PO TABS Oral Take 5 mg by mouth daily.      Marland Kitchen METFORMIN HCL 500 MG PO TABS Oral Take 500 mg by mouth 2 (two) times daily with a meal.      . METOPROLOL SUCCINATE 50 MG PO TB24  Oral Take 50 mg by mouth daily.      Marland Kitchen NIACIN (ANTIHYPERLIPIDEMIC) 500 MG PO TBCR Oral Take 500 mg by mouth at bedtime.      . OXYCODONE-ACETAMINOPHEN 10-325 MG PO TABS Oral Take 1 tablet by mouth every 4 (four) hours as needed. For pain     . PANTOPRAZOLE SODIUM 40 MG PO TBEC Oral Take 40 mg by mouth daily.      Marland Kitchen PROMETHAZINE HCL 25 MG PO TABS Oral Take 25 mg by mouth every 6 (six) hours as needed. Nausea/vomiting     . ROSUVASTATIN CALCIUM 20 MG PO TABS Oral Take 20 mg by mouth daily.      Marland Kitchen HYDROCODONE-ACETAMINOPHEN 5-325 MG PO TABS Oral Take 1 tablet by mouth every 4 (four) hours as needed for pain. 20 tablet 0    BP 147/75   Pulse 80  Temp(Src) 97.8 F (36.6 C) (Oral)  Resp 20  Ht 6' (1.829 m)  Wt 182 lb (82.555 kg)  BMI 24.68 kg/m2  SpO2 97%  Physical Exam  Nursing note and vitals reviewed. Constitutional: He is oriented to person, place, and time. He appears well-developed and well-nourished.  HENT:  Head: Normocephalic.  Eyes: Conjunctivae are normal.  Neck: Normal range of motion. Neck supple.  Cardiovascular: Regular rhythm and intact distal pulses.        Pedal pulses normal.  Pulmonary/Chest: Effort normal. He has no wheezes.  Abdominal: Soft. Bowel sounds are normal. He exhibits no distension and no mass.  Musculoskeletal: Normal range of motion. He exhibits no edema.       Lumbar back: He exhibits tenderness and bony tenderness. He exhibits no swelling, no edema and no spasm.       Point tender midline upper lumbar region.   Neurological: He is alert and oriented to person, place, and time. He has normal strength. He displays no atrophy and no tremor. No cranial nerve deficit or sensory deficit. Gait normal.  Reflex Scores:      Patellar reflexes are 2+ on the right side and 2+ on the left side.      Achilles reflexes are 2+ on the right side and 2+ on the left side.      No strength deficit noted in hip and knee flexor and extensor muscle groups.  Ankle flexion and extension intact.  Skin: Skin is warm and dry.  Psychiatric: He has a normal mood and affect.    ED Course  Procedures (including critical care time)   Labs Reviewed  URINALYSIS, ROUTINE W REFLEX MICROSCOPIC  LAB REPORT - SCANNED   Dg Lumbar Spine Complete  11/16/2010  *RADIOLOGY REPORT*  Clinical Data: 65 year old male with back pain increasing times 3 days.  No known injury.  LUMBAR SPINE - COMPLETE 4+ VIEW  Comparison: Chest CT 08/25/2010.  Findings: Normal lumbar segmentation.  New moderate compression fracture T12 since August.  Lumbar levels appear normally aligned with maintained height.  Intermittent mild disc space  narrowing. Osteopenia.  Vascular calcifications.  IMPRESSION: Moderate T12 compression fracture new since August.  Per CMS PQRS reporting requirements (PQRS Measure 24): Given the patient's age of greater than 50 and the fracture site (hip, distal radius, or spine), the patient should be tested for osteoporosis using DXA, and the appropriate treatment considered based on the DXA results.  Original Report Authenticated By: Ulla Potash III, M.D.     1. Compression fracture of thoracic vertebra       MDM  Compression fracture of  T12.  Patient does report having a recent possible lung mass found on CT scan here 8/12 - in process of being evaluated by pcp.  Recommended f/u with  pcp,  But also referred to ortho for possible kyphoplasty procedure. PCP is at [facility].        Candis Musa, PA 11/18/10 1255  Candis Musa, PA 11/18/10 1256

## 2010-11-19 NOTE — ED Provider Notes (Signed)
Medical screening examination/treatment/procedure(s) were performed by non-physician practitioner and as supervising physician I was immediately available for consultation/collaboration.  Bryar Rennie, MD 11/19/10 0117 

## 2010-12-17 ENCOUNTER — Emergency Department (HOSPITAL_COMMUNITY)
Admission: EM | Admit: 2010-12-17 | Discharge: 2010-12-17 | Payer: Medicare Other | Attending: Emergency Medicine | Admitting: Emergency Medicine

## 2010-12-17 ENCOUNTER — Encounter (HOSPITAL_COMMUNITY): Payer: Self-pay | Admitting: Emergency Medicine

## 2010-12-17 DIAGNOSIS — M549 Dorsalgia, unspecified: Secondary | ICD-10-CM

## 2010-12-17 DIAGNOSIS — Z79899 Other long term (current) drug therapy: Secondary | ICD-10-CM | POA: Insufficient documentation

## 2010-12-17 DIAGNOSIS — E119 Type 2 diabetes mellitus without complications: Secondary | ICD-10-CM | POA: Insufficient documentation

## 2010-12-17 DIAGNOSIS — M546 Pain in thoracic spine: Secondary | ICD-10-CM | POA: Insufficient documentation

## 2010-12-17 DIAGNOSIS — I10 Essential (primary) hypertension: Secondary | ICD-10-CM | POA: Insufficient documentation

## 2010-12-17 DIAGNOSIS — E785 Hyperlipidemia, unspecified: Secondary | ICD-10-CM | POA: Insufficient documentation

## 2010-12-17 DIAGNOSIS — I2510 Atherosclerotic heart disease of native coronary artery without angina pectoris: Secondary | ICD-10-CM | POA: Insufficient documentation

## 2010-12-17 DIAGNOSIS — I509 Heart failure, unspecified: Secondary | ICD-10-CM | POA: Insufficient documentation

## 2010-12-17 NOTE — ED Notes (Signed)
Pt c/o lower back pain due to lumbar compression history. Pt reports that his doctor is in [place] and he is unable to see his doctor. Pt reports pain 10. Denies any numbness or tingling. Gait steady.

## 2010-12-17 NOTE — ED Notes (Signed)
Patient c/o back pain. Reports lumbar compression fracture history.

## 2010-12-17 NOTE — ED Provider Notes (Addendum)
History     CSN: [id number] Arrival date & time: 12/17/2010  6:26 PM   First MD Initiated Contact with Patient 12/17/10 1848      No chief complaint on file.   (Consider location/radiation/quality/duration/timing/severity/associated sxs/prior treatment) HPI Patient states back pain since he fell and had a compression fracture.  Patient states he has not been to his doctor since November when he was diagnosed.  Patient states pain is the same pain.  Patient states he lives here in [place] and he cannot get to his doctor in [place].   Past Medical History  Diagnosis Date  . CHF (congestive heart failure)   . Coronary artery disease   . Diabetes mellitus   . Hyperlipidemia   . Hypertension     Past Surgical History  Procedure Date  . Coronary angioplasty with stent placement   . Coronary artery bypass graft     No family history on file.  History  Substance Use Topics  . Smoking status: Former Games developer  . Smokeless tobacco: Not on file  . Alcohol Use: No     Pt states he has not drank in 2 years      Review of Systems  All other systems reviewed and are negative.    Allergies  Benadryl  Home Medications   Current Outpatient Rx  Name Route Sig Dispense Refill  . ALPRAZOLAM 1 MG PO TABS Oral Take 1 mg by mouth every 6 (six) hours as needed. For anxiety    . AMLODIPINE BESY-BENAZEPRIL HCL 10-20 MG PO CAPS Oral Take 1 capsule by mouth daily.      . ASPIRIN EC 81 MG PO TBEC Oral Take 81 mg by mouth daily.      Marland Kitchen FLUTICASONE PROPIONATE 50 MCG/ACT NA SUSP Nasal Place 2 sprays into the nose daily as needed. congestion    . GLIPIZIDE 5 MG PO TABS Oral Take 5 mg by mouth daily.      Marland Kitchen METFORMIN HCL 500 MG PO TABS Oral Take 500 mg by mouth 2 (two) times daily with a meal.      . METOPROLOL SUCCINATE ER 50 MG PO TB24 Oral Take 50 mg by mouth daily.      Marland Kitchen NIACIN (ANTIHYPERLIPIDEMIC) 500 MG PO TBCR Oral Take 500 mg by mouth at bedtime.      .  OXYCODONE-ACETAMINOPHEN 10-325 MG PO TABS Oral Take 1 tablet by mouth every 4 (four) hours as needed. For pain     . PANTOPRAZOLE SODIUM 40 MG PO TBEC Oral Take 40 mg by mouth daily.      Marland Kitchen PROMETHAZINE HCL 25 MG PO TABS Oral Take 25 mg by mouth every 6 (six) hours as needed. Nausea/vomiting     . ROSUVASTATIN CALCIUM 20 MG PO TABS Oral Take 20 mg by mouth daily.        BP 174/99  Pulse 68  Temp(Src) 98 F (36.7 C) (Oral)  Resp 21  Ht 6' (1.829 m)  Wt 183 lb (83.008 kg)  BMI 24.82 kg/m2  SpO2 92%  Physical Exam  Nursing note and vitals reviewed. Constitutional: He is oriented to person, place, and time. He appears well-developed and well-nourished.  HENT:  Head: Normocephalic and atraumatic.  Right Ear: External ear normal.  Left Ear: External ear normal.  Eyes: Conjunctivae and EOM are normal. Pupils are equal, round, and reactive to light.  Neck: Normal range of motion. Neck supple.  Cardiovascular: Normal rate and regular rhythm.   Pulmonary/Chest: Effort normal  and breath sounds normal.  Abdominal: Soft. Bowel sounds are normal.  Musculoskeletal: Normal range of motion.       Pain lower thoracic spine with palpation.  Neurological: He is alert and oriented to person, place, and time. He has normal reflexes.  Skin: Skin is warm and dry.    ED Course  Procedures (including critical care time)  Labs Reviewed - No data to display No results found.   No diagnosis found.    MDM  According to [place] drug database patient received 124 hydrocodone 10mg  on 12/01/10.  Patient advised to follow up with his pmd for appropriate pain management.  Hilario Quarry, MD 12/17/10 1610  Hilario Quarry, MD 12/17/10 9604

## 2011-09-13 ENCOUNTER — Other Ambulatory Visit (HOSPITAL_COMMUNITY): Payer: Self-pay | Admitting: Nurse Practitioner

## 2011-09-13 ENCOUNTER — Ambulatory Visit (HOSPITAL_COMMUNITY)
Admission: RE | Admit: 2011-09-13 | Discharge: 2011-09-13 | Disposition: A | Discharge status: Home / Self Care | Payer: PRIVATE HEALTH INSURANCE | Source: Ambulatory Visit | Attending: Nurse Practitioner | Admitting: Nurse Practitioner

## 2011-09-13 DIAGNOSIS — M549 Dorsalgia, unspecified: Secondary | ICD-10-CM

## 2011-09-13 DIAGNOSIS — M8448XA Pathological fracture, other site, initial encounter for fracture: Secondary | ICD-10-CM | POA: Insufficient documentation

## 2011-09-13 DIAGNOSIS — R079 Chest pain, unspecified: Secondary | ICD-10-CM | POA: Insufficient documentation

## 2011-09-13 DIAGNOSIS — R29890 Loss of height: Secondary | ICD-10-CM | POA: Insufficient documentation

## 2011-09-13 DIAGNOSIS — R634 Abnormal weight loss: Secondary | ICD-10-CM

## 2011-09-13 DIAGNOSIS — R918 Other nonspecific abnormal finding of lung field: Secondary | ICD-10-CM | POA: Insufficient documentation

## 2012-06-02 ENCOUNTER — Encounter (HOSPITAL_COMMUNITY): Payer: Self-pay

## 2012-06-02 ENCOUNTER — Emergency Department (HOSPITAL_COMMUNITY): Payer: PRIVATE HEALTH INSURANCE

## 2012-06-02 ENCOUNTER — Inpatient Hospital Stay (HOSPITAL_COMMUNITY)
Admission: EM | Admit: 2012-06-02 | Discharge: 2012-06-04 | DRG: 310 | Disposition: A | Discharge status: Home / Self Care | Payer: PRIVATE HEALTH INSURANCE | Attending: Cardiovascular Disease | Admitting: Cardiovascular Disease

## 2012-06-02 DIAGNOSIS — Z9861 Coronary angioplasty status: Secondary | ICD-10-CM

## 2012-06-02 DIAGNOSIS — I252 Old myocardial infarction: Secondary | ICD-10-CM

## 2012-06-02 DIAGNOSIS — N289 Disorder of kidney and ureter, unspecified: Secondary | ICD-10-CM | POA: Diagnosis present

## 2012-06-02 DIAGNOSIS — I2589 Other forms of chronic ischemic heart disease: Secondary | ICD-10-CM | POA: Diagnosis present

## 2012-06-02 DIAGNOSIS — E785 Hyperlipidemia, unspecified: Secondary | ICD-10-CM | POA: Diagnosis present

## 2012-06-02 DIAGNOSIS — Z9181 History of falling: Secondary | ICD-10-CM

## 2012-06-02 DIAGNOSIS — I959 Hypotension, unspecified: Secondary | ICD-10-CM | POA: Diagnosis present

## 2012-06-02 DIAGNOSIS — Z7982 Long term (current) use of aspirin: Secondary | ICD-10-CM

## 2012-06-02 DIAGNOSIS — I2510 Atherosclerotic heart disease of native coronary artery without angina pectoris: Secondary | ICD-10-CM | POA: Diagnosis present

## 2012-06-02 DIAGNOSIS — Z79899 Other long term (current) drug therapy: Secondary | ICD-10-CM

## 2012-06-02 DIAGNOSIS — I4892 Unspecified atrial flutter: Principal | ICD-10-CM | POA: Diagnosis present

## 2012-06-02 DIAGNOSIS — I509 Heart failure, unspecified: Secondary | ICD-10-CM | POA: Diagnosis present

## 2012-06-02 DIAGNOSIS — Z951 Presence of aortocoronary bypass graft: Secondary | ICD-10-CM

## 2012-06-02 DIAGNOSIS — Z87891 Personal history of nicotine dependence: Secondary | ICD-10-CM

## 2012-06-02 DIAGNOSIS — I4891 Unspecified atrial fibrillation: Secondary | ICD-10-CM

## 2012-06-02 DIAGNOSIS — I359 Nonrheumatic aortic valve disorder, unspecified: Secondary | ICD-10-CM | POA: Diagnosis present

## 2012-06-02 DIAGNOSIS — I10 Essential (primary) hypertension: Secondary | ICD-10-CM | POA: Diagnosis present

## 2012-06-02 DIAGNOSIS — I484 Atypical atrial flutter: Secondary | ICD-10-CM

## 2012-06-02 DIAGNOSIS — E1169 Type 2 diabetes mellitus with other specified complication: Secondary | ICD-10-CM | POA: Diagnosis present

## 2012-06-02 HISTORY — DX: Other specified abnormal findings of blood chemistry: R79.89

## 2012-06-02 HISTORY — DX: Ischemic cardiomyopathy: I25.5

## 2012-06-02 HISTORY — DX: Nonrheumatic aortic (valve) stenosis: I35.0

## 2012-06-02 HISTORY — DX: Nontraumatic intracerebral hemorrhage, unspecified: I61.9

## 2012-06-02 HISTORY — DX: Problem related to unspecified psychosocial circumstances: Z65.9

## 2012-06-02 HISTORY — DX: Problem related to social environment, unspecified: Z60.9

## 2012-06-02 HISTORY — DX: Atypical atrial flutter: I48.4

## 2012-06-02 LAB — COMPREHENSIVE METABOLIC PANEL
ALT: 10 U/L (ref 0–53)
Albumin: 3.3 g/dL — ABNORMAL LOW (ref 3.5–5.2)
Alkaline Phosphatase: 102 U/L (ref 39–117)
Potassium: 3.9 mEq/L (ref 3.5–5.1)
Sodium: 140 mEq/L (ref 135–145)
Total Protein: 6.5 g/dL (ref 6.0–8.3)

## 2012-06-02 LAB — BASIC METABOLIC PANEL
GFR calc Af Amer: 53 mL/min — ABNORMAL LOW (ref >90)
GFR calc non Af Amer: 46 mL/min — ABNORMAL LOW (ref >90)
Potassium: 3.3 mEq/L — ABNORMAL LOW (ref 3.5–5.1)
Sodium: 139 mEq/L (ref 135–145)

## 2012-06-02 LAB — ETHANOL: Alcohol, Ethyl (B): <11 mg/dL (ref 0–11)

## 2012-06-02 LAB — CBC
HCT: 40.3 % (ref 39.0–52.0)
MCHC: 33.7 g/dL (ref 30.0–36.0)
MCV: 84 fL (ref 78.0–100.0)
RDW: 14.4 % (ref 11.5–15.5)

## 2012-06-02 LAB — CBC WITH DIFFERENTIAL/PLATELET
Basophils Relative: 0 % (ref 0–1)
Eosinophils Absolute: 0.1 10*3/uL (ref 0.0–0.7)
Lymphs Abs: 0.8 10*3/uL (ref 0.7–4.0)
MCH: 28.2 pg (ref 26.0–34.0)
MCHC: 34.5 g/dL (ref 30.0–36.0)
Neutrophils Relative %: 82 % — ABNORMAL HIGH (ref 43–77)
Platelets: 180 10*3/uL (ref 150–400)
RBC: 4.82 MIL/uL (ref 4.22–5.81)

## 2012-06-02 LAB — GLUCOSE, CAPILLARY: Glucose-Capillary: 91 mg/dL (ref 70–99)

## 2012-06-02 LAB — URINALYSIS, ROUTINE W REFLEX MICROSCOPIC
Ketones, ur: NEGATIVE mg/dL
Leukocytes, UA: NEGATIVE
Nitrite: NEGATIVE
Protein, ur: NEGATIVE mg/dL

## 2012-06-02 MED ORDER — SODIUM CHLORIDE 0.9 % IV SOLN: 250.0000 mL | INTRAVENOUS | Status: DC | PRN

## 2012-06-02 MED ORDER — DOUBLE ANTIBIOTIC 500-10000 UNIT/GM EX OINT
TOPICAL_OINTMENT | Freq: Once | CUTANEOUS | Status: AC
  Administered 2012-06-02: 1 via TOPICAL
  Filled 2012-06-02: qty 1

## 2012-06-02 MED ORDER — POTASSIUM CHLORIDE CRYS ER 20 MEQ PO TBCR
40.0000 meq | EXTENDED_RELEASE_TABLET | Freq: Once | ORAL | Status: AC
  Administered 2012-06-02: 40 meq via ORAL
  Filled 2012-06-02: qty 2

## 2012-06-02 MED ORDER — METOPROLOL SUCCINATE ER 50 MG PO TB24
50.0000 mg | ORAL_TABLET | Freq: Every day | ORAL | Status: DC
  Administered 2012-06-02: 50 mg via ORAL
  Filled 2012-06-02 (×2): qty 1

## 2012-06-02 MED ORDER — PNEUMOCOCCAL VAC POLYVALENT 25 MCG/0.5ML IJ INJ
0.5000 mL | INJECTION | INTRAMUSCULAR | Status: AC
  Filled 2012-06-02: qty 0.5

## 2012-06-02 MED ORDER — HEPARIN SODIUM (PORCINE) 5000 UNIT/ML IJ SOLN
5000.0000 [IU] | Freq: Three times a day (TID) | INTRAMUSCULAR | Status: DC
  Administered 2012-06-02 – 2012-06-04 (×5): 5000 [IU] via SUBCUTANEOUS
  Filled 2012-06-02 (×8): qty 1

## 2012-06-02 MED ORDER — HYDROCODONE-ACETAMINOPHEN 5-325 MG PO TABS
2.0000 | ORAL_TABLET | Freq: Once | ORAL | Status: AC
  Administered 2012-06-02: 2 via ORAL
  Filled 2012-06-02: qty 2

## 2012-06-02 MED ORDER — SODIUM CHLORIDE 0.9 % IV BOLUS (SEPSIS)
500.0000 mL | Freq: Once | INTRAVENOUS | Status: AC
  Administered 2012-06-02: 500 mL via INTRAVENOUS

## 2012-06-02 MED ORDER — ONDANSETRON HCL 4 MG/2ML IJ SOLN: 4.0000 mg | Freq: Four times a day (QID) | INTRAMUSCULAR | Status: DC | PRN

## 2012-06-02 MED ORDER — NIACIN ER (ANTIHYPERLIPIDEMIC) 500 MG PO TBCR
500.0000 mg | EXTENDED_RELEASE_TABLET | Freq: Every day | ORAL | Status: DC
Start: 2012-06-02 — End: 2012-06-02
  Filled 2012-06-02: qty 1

## 2012-06-02 MED ORDER — OXYCODONE-ACETAMINOPHEN 5-325 MG PO TABS
1.0000 | ORAL_TABLET | ORAL | Status: DC | PRN
  Administered 2012-06-02 – 2012-06-04 (×3): 1 via ORAL
  Filled 2012-06-02 (×3): qty 1

## 2012-06-02 MED ORDER — NIACIN ER 500 MG PO CPCR
500.0000 mg | ORAL_CAPSULE | Freq: Every day | ORAL | Status: DC
  Administered 2012-06-02 – 2012-06-03 (×2): 500 mg via ORAL
  Filled 2012-06-02 (×3): qty 1

## 2012-06-02 MED ORDER — SODIUM CHLORIDE 0.9 % IV BOLUS (SEPSIS): 500.0000 mL | Freq: Once | INTRAVENOUS | Status: DC

## 2012-06-02 MED ORDER — ALPRAZOLAM 0.25 MG PO TABS
0.2500 mg | ORAL_TABLET | Freq: Three times a day (TID) | ORAL | Status: DC | PRN
  Administered 2012-06-02: 0.25 mg via ORAL
  Filled 2012-06-02: qty 4
  Filled 2012-06-02: qty 1

## 2012-06-02 MED ORDER — METFORMIN HCL 500 MG PO TABS: 500.0000 mg | ORAL_TABLET | Freq: Two times a day (BID) | ORAL | Status: DC

## 2012-06-02 MED ORDER — TETANUS-DIPHTH-ACELL PERTUSSIS 5-2.5-18.5 LF-MCG/0.5 IM SUSP
0.5000 mL | Freq: Once | INTRAMUSCULAR | Status: AC
  Administered 2012-06-02: 0.5 mL via INTRAMUSCULAR
  Filled 2012-06-02: qty 0.5

## 2012-06-02 MED ORDER — ASPIRIN EC 81 MG PO TBEC
81.0000 mg | DELAYED_RELEASE_TABLET | Freq: Every day | ORAL | Status: DC
  Administered 2012-06-02 – 2012-06-04 (×3): 81 mg via ORAL
  Filled 2012-06-02 (×3): qty 1

## 2012-06-02 MED ORDER — ONDANSETRON HCL 4 MG PO TABS
4.0000 mg | ORAL_TABLET | Freq: Once | ORAL | Status: AC
  Administered 2012-06-02: 4 mg via ORAL
  Filled 2012-06-02: qty 1

## 2012-06-02 MED ORDER — MORPHINE SULFATE 4 MG/ML IJ SOLN: 4.0000 mg | Freq: Once | INTRAMUSCULAR | Status: DC

## 2012-06-02 MED ORDER — SODIUM CHLORIDE 0.9 % IJ SOLN
3.0000 mL | Freq: Two times a day (BID) | INTRAMUSCULAR | Status: DC
  Administered 2012-06-02 – 2012-06-03 (×2): 3 mL via INTRAVENOUS

## 2012-06-02 MED ORDER — OXYCODONE-ACETAMINOPHEN 10-325 MG PO TABS: 1.0000 | ORAL_TABLET | ORAL | Status: DC | PRN

## 2012-06-02 MED ORDER — SODIUM CHLORIDE 0.9 % IJ SOLN: 3.0000 mL | INTRAMUSCULAR | Status: DC | PRN

## 2012-06-02 MED ORDER — GLIPIZIDE 5 MG PO TABS
5.0000 mg | ORAL_TABLET | Freq: Every day | ORAL | Status: DC
  Administered 2012-06-02 – 2012-06-03 (×2): 5 mg via ORAL
  Filled 2012-06-02 (×2): qty 1

## 2012-06-02 MED ORDER — ACETAMINOPHEN 325 MG PO TABS: 650.0000 mg | ORAL_TABLET | ORAL | Status: DC | PRN

## 2012-06-02 MED ORDER — ATORVASTATIN CALCIUM 40 MG PO TABS
40.0000 mg | ORAL_TABLET | Freq: Every day | ORAL | Status: DC
  Administered 2012-06-03: 40 mg via ORAL
  Filled 2012-06-02 (×2): qty 1

## 2012-06-02 MED ORDER — OXYCODONE HCL 5 MG PO TABS
5.0000 mg | ORAL_TABLET | ORAL | Status: DC | PRN
  Administered 2012-06-03 – 2012-06-04 (×3): 5 mg via ORAL
  Filled 2012-06-02 (×3): qty 1

## 2012-06-02 NOTE — ED Notes (Signed)
Minerva Areola, a friend of pt called and left his # [phone] if pt needs a ride home

## 2012-06-02 NOTE — H&P (Addendum)
CARDIOLOGY ADMISSION NOTE  Patient ID: [name] MRN: [id number] DOB/AGE: 02-08-1945 67 y.o.  Admit date: 06/02/2012 Primary Physician   Mitzi Hansen, NP Primary Cardiologist   Dr. Tresa Endo (He has not seen him in several years) Chief Complaint    "I fell."  HPI:  The patient presented to [facility] ER today after a fall.  He was noted to be in atrial flutter and was transferred for admission after discussion with Drs Diona Browner and Excell Seltzer. He has not seen a cardiologist in some time.   In the ER he had a rapid rate of 130s with SBP 89.  Head CT to follow up the fall and trauma demonstrated no acute changes.   Followup EKG demonstrated variable conduction with probable atypical flutter but a much slower rate. However, after searching through the ER notes it's not clear that he was treated with any AV nodal blocking agents.  The patient was previously seen by Dr. Tresa Endo with a cath in 2006.  He had occluded vein graft to the PDA. The RCA had 80% ostial stenosis.  OM was occluded and LAD had 70% proximal stenosis.)   He had stenting of the proximal RCA with a Taxus stent.  He is s/p CABG in 2004 (LIMA to LAD, SVG to diag, SVG to OM, SVG to PDA.  EF ws 40%.)  The patient does not recall any rapid heart rates in noted his heart was out of rhythm. He does occasionally get lightheaded with standing. However, he says he does not pass out. He did not pass out today. He fell while he was walking to the store. He walks unassisted and has to walk to get from place to place as he has no car. He apparently has limited funds but is able to take his medications. He lives with friends. He has not seen a cardiologist in some time. He is not describing chest pressure, neck or arm discomfort. He doesn't describe shortness of breath, PND or orthopnea.   Past Medical History  Diagnosis Date  . CHF (congestive heart failure)   . Coronary artery disease     Cath in 2006.  He had occluded vein graft to the PDA. Patent  LIMA LAD, patent SVG to Diag, patent SVG to OM, 70%proximal LAD, occluded OM, 80% ostial RCA.  RCA stented with Taxus stent.  . Diabetes mellitus   . Hyperlipidemia   . Hypertension     Past Surgical History  Procedure Laterality Date  . Coronary artery bypass graft      2004    Allergies  Allergen Reactions  . Benadryl (Diphenhydramine Hcl) Other (See Comments)    Hot Flashes "Like Niaspan"   No current facility-administered medications on file prior to encounter.   Current Outpatient Prescriptions on File Prior to Encounter  Medication Sig Dispense Refill  . ALPRAZolam (XANAX) 1 MG tablet Take 1 mg by mouth every 6 (six) hours as needed. For anxiety      . amLODipine-benazepril (LOTREL) 10-20 MG per capsule Take 1 capsule by mouth daily.        . fluticasone (FLONASE) 50 MCG/ACT nasal spray Place 2 sprays into the nose daily as needed. congestion      . glipiZIDE (GLUCOTROL) 5 MG tablet Take 5 mg by mouth daily.        . metFORMIN (GLUCOPHAGE) 500 MG tablet Take 500 mg by mouth 2 (two) times daily with a meal.        . metoprolol (TOPROL-XL) 50 MG  24 hr tablet Take 50 mg by mouth daily.        . niacin (NIASPAN) 500 MG CR tablet Take 500 mg by mouth at bedtime.        Marland Kitchen oxyCODONE-acetaminophen (PERCOCET) 10-325 MG per tablet Take 1 tablet by mouth every 4 (four) hours as needed. For pain       . promethazine (PHENERGAN) 25 MG tablet Take 25 mg by mouth every 6 (six) hours as needed. Nausea/vomiting       . rosuvastatin (CRESTOR) 20 MG tablet Take 20 mg by mouth daily.         History   Social History  . Marital Status: Divorced    Spouse Name: N/A    Number of Children: N/A  . Years of Education: N/A   Occupational History  . Not on file.   Social History Main Topics  . Smoking status: Former Games developer  . Smokeless tobacco: Not on file  . Alcohol Use: No     Comment: Pt states he has not drank in 2 years  . Drug Use: No  . Sexually Active:    Other Topics Concern  .  Not on file   Social History Narrative  . No narrative on file    No family history on file.   ROS:  As stated in the HPI and negative for all other systems.  Physical Exam: Blood pressure 114/87, pulse 81, temperature 98.8 F (37.1 C), temperature source Oral, resp. rate 23, height 6\' 5"  (1.956 m), weight 165 lb 12.6 oz (75.2 kg), SpO2 96.00%.  GENERAL:  Well appearing HEENT:  Pupils equal round and reactive, fundi not visualized, oral mucosa unremarkable, he has a slight hematoma and significant scalp abrasions. NECK:  No jugular venous distention, waveform within normal limits, carotid upstroke brisk and symmetric, no bruits, no thyromegaly LYMPHATICS:  No cervical, inguinal adenopathy LUNGS:  Clear to auscultation bilaterally BACK:  No CVA tenderness CHEST:  Unremarkable HEART:  PMI not displaced or sustained,S1 and S2 within normal limits, no S3,  no clicks, no rubs, 2/6 apical systolic murmur radiating slightly out the aortic outflow tract and into the carotids, is also somewhat audible of the axilla, no diastolic murmurs  ABD:  Flat, positive bowel sounds normal in frequency in pitch, no bruits, no rebound, no guarding, no midline pulsatile mass, no hepatomegaly, no splenomegaly EXT:  2 plus pulses throughout, no edema, no cyanosis no clubbing SKIN:  No rashes no nodules NEURO:  Cranial nerves II through XII grossly intact, motor grossly intact throughout PSYCH:  Cognitively intact, oriented to person place and time  Labs: Lab Results  Component Value Date   BUN 18 06/02/2012   Lab Results  Component Value Date   CREATININE 1.51* 06/02/2012   Lab Results  Component Value Date   NA 139 06/02/2012   K 3.3* 06/02/2012   CL 100 06/02/2012   CO2 24 06/02/2012   Lab Results  Component Value Date   TROPONINI <0.30 06/02/2012   Lab Results  Component Value Date   WBC 8.9 06/02/2012   HGB 13.6 06/02/2012   HCT 39.4 06/02/2012   MCV 81.7 06/02/2012   PLT 180 06/02/2012     Radiology:   CXR:  Bibasilar atelectasis. No acute abnormality.    EKG:  Atrial flutter with 2:1 conduction with RBBB.  06/02/2012  ASSESSMENT AND PLAN:    ATRIAL FLUTTER:  The rate is currently controlled.  I do not see in the emergency room  he was treated with any AV nodal blocking agents. For now I will continue his previous medications and watches rate on telemetry. Given his falls and what sounds like a tenuous social situation he would likely be high risk for systemic anticoagulation and we will need to assess this through his hospitalization. The duration of this is not clear so cardioversion is not feasible.  HYPOTENSION:  I will hold the Lotrel for now but continue low dose beta blocker.   CAD:  He has no active symptoms.  Continue risk reduction.  I will stop Plavix but continue ASA.    CKD:  I suspect that he is somewhat dehydrated.  I will check orthostatic blood pressures. We will follow his renal function and check an echocardiogram given his cardiomyopathy. For now I will hold off on hydration.  I will hold his home Lasix.   ISCHEMIC CARDIOMYOPATHY: As above I will check an echocardiogram.  DIZZINESS:  I will check orthostatic BPs.    DIABETES:  Continue current medications and check an A1C.    SignedRollene Rotunda 06/02/2012, 9:11 PM

## 2012-06-02 NOTE — ED Provider Notes (Signed)
History     CSN: [id number]  Arrival date & time 06/02/12  6213   First MD Initiated Contact with Patient 06/02/12 0805      Chief Complaint  Patient presents with  . Fall    (Consider location/radiation/quality/duration/timing/severity/associated sxs/prior treatment) HPI Comments: Patient is a 67 year old male with a history of myocardial infarction, coronary bypass grafting, coronary stent, diabetes mellitus, hypertension, hyperlipidemia, and congestive heart failure who presents to the emergency department after having sustained a fall this morning. The patient states that over the last few weeks he has been having increased weakness of his legs, time sustaining a fall. This morning the patient states he was going to a store and to get something to eat when his legs" went out" and he hit a metal door frame. The patient states he did not have loss of consciousness. He denied any chest pain at the time. He denied any shortness of breath. He presents now for assistance with the fall and with the abrasions to his head and face.  The history is provided by the patient.    Past Medical History  Diagnosis Date  . CHF (congestive heart failure)   . Coronary artery disease   . Diabetes mellitus   . Hyperlipidemia   . Hypertension     Past Surgical History  Procedure Laterality Date  . Coronary angioplasty with stent placement    . Coronary artery bypass graft      No family history on file.  History  Substance Use Topics  . Smoking status: Former Games developer  . Smokeless tobacco: Not on file  . Alcohol Use: No     Comment: Pt states he has not drank in 2 years      Review of Systems  Constitutional: Negative for activity change.       All ROS Neg except as noted in HPI  HENT: Negative for nosebleeds and neck pain.   Eyes: Negative for photophobia and discharge.  Respiratory: Positive for shortness of breath. Negative for cough and wheezing.   Cardiovascular: Negative for  chest pain and palpitations.  Gastrointestinal: Negative for abdominal pain and blood in stool.  Genitourinary: Negative for dysuria, frequency and hematuria.  Musculoskeletal: Negative for back pain and arthralgias.  Skin: Negative.   Neurological: Negative for dizziness, seizures and speech difficulty.  Psychiatric/Behavioral: Negative for hallucinations and confusion.    Allergies  Benadryl  Home Medications   Current Outpatient Rx  Name  Route  Sig  Dispense  Refill  . ALPRAZolam (XANAX) 1 MG tablet   Oral   Take 1 mg by mouth every 6 (six) hours as needed. For anxiety         . amLODipine-benazepril (LOTREL) 10-20 MG per capsule   Oral   Take 1 capsule by mouth daily.           . clopidogrel (PLAVIX) 75 MG tablet   Oral   Take 75 mg by mouth daily.         . fluticasone (FLONASE) 50 MCG/ACT nasal spray   Nasal   Place 2 sprays into the nose daily as needed. congestion         . furosemide (LASIX) 20 MG tablet   Oral   Take 20 mg by mouth 2 (two) times daily.         Marland Kitchen glipiZIDE (GLUCOTROL) 5 MG tablet   Oral   Take 5 mg by mouth daily.           Marland Kitchen  metFORMIN (GLUCOPHAGE) 500 MG tablet   Oral   Take 500 mg by mouth 2 (two) times daily with a meal.           . metoprolol (TOPROL-XL) 50 MG 24 hr tablet   Oral   Take 50 mg by mouth daily.           . niacin (NIASPAN) 500 MG CR tablet   Oral   Take 500 mg by mouth at bedtime.           Marland Kitchen oxyCODONE-acetaminophen (PERCOCET) 10-325 MG per tablet   Oral   Take 1 tablet by mouth every 4 (four) hours as needed. For pain          . promethazine (PHENERGAN) 25 MG tablet   Oral   Take 25 mg by mouth every 6 (six) hours as needed. Nausea/vomiting          . rosuvastatin (CRESTOR) 20 MG tablet   Oral   Take 20 mg by mouth daily.             BP 93/64  Pulse 65  Temp(Src) 98.9 F (37.2 C) (Oral)  Resp 20  Ht 6' 0.5" (1.842 m)  Wt 160 lb (72.576 kg)  BMI 21.39 kg/m2  SpO2  96%  Physical Exam  Nursing note and vitals reviewed. Constitutional: He is oriented to person, place, and time. He appears well-developed and well-nourished.  Non-toxic appearance.  HENT:  Head: Normocephalic.  Right Ear: Tympanic membrane and external ear normal.  Left Ear: Tympanic membrane and external ear normal.  Patient has an abrasion of the scalp. Abrasion of the left face. There is nasal congestion present.  Eyes: EOM and lids are normal. Pupils are equal, round, and reactive to light.  Neck: Normal range of motion. Neck supple. Carotid bruit is not present.  Cardiovascular: Regular rhythm, normal heart sounds, intact distal pulses and normal pulses.   Pulmonary/Chest: Breath sounds normal. No respiratory distress. He exhibits no tenderness.  Well healed anterior surgical scar. Symmetrical rise and fall of the chest.  Abdominal: Soft. Bowel sounds are normal. There is no tenderness. There is no guarding.  Musculoskeletal: Normal range of motion.  No pelvis or hip pain.  Lymphadenopathy:       Head (right side): No submandibular adenopathy present.       Head (left side): No submandibular adenopathy present.    He has no cervical adenopathy.  Neurological: He is alert and oriented to person, place, and time. He has normal strength. No cranial nerve deficit or sensory deficit. He exhibits normal muscle tone. Coordination normal.  Skin: Skin is warm and dry.  Psychiatric: He has a normal mood and affect. His speech is normal.    ED Course  Procedures (including critical care time)  Labs Reviewed  GLUCOSE, CAPILLARY   Ct Head Wo Contrast  06/02/2012   *RADIOLOGY REPORT*  Clinical Data:  Fall with a blow to the left side of the head and face.  CT HEAD WITHOUT CONTRAST CT CERVICAL SPINE WITHOUT CONTRAST  Technique:  Multidetector CT imaging of the head and cervical spine was performed following the standard protocol without intravenous contrast.  Multiplanar CT image  reconstructions of the cervical spine were also generated.  Comparison:  Headache, cervical and maxillofacial CT scan 08/25/2010.  CT HEAD  Findings: The brain demonstrates cortical atrophy and chronic microvascular ischemic change.  There is no evidence of acute abnormality including infarction, hemorrhage, mass, mass effect, midline shift or abnormal  extra-axial fluid collection.  No hydrocephalus or pneumocephalus.  The calvarium is intact.  IMPRESSION:  1.  No acute finding. 2.  Atrophy and chronic microvascular ischemic change  CT CERVICAL SPINE  Findings: There is no fracture subluxation of the cervical spine. The patient has only mild degenerative change.  Lung apices are clear.  IMPRESSION: Negative exam.   Original Report Authenticated By: Holley Dexter, M.D.   Ct Cervical Spine Wo Contrast  06/02/2012   *RADIOLOGY REPORT*  Clinical Data:  Fall with a blow to the left side of the head and face.  CT HEAD WITHOUT CONTRAST CT CERVICAL SPINE WITHOUT CONTRAST  Technique:  Multidetector CT imaging of the head and cervical spine was performed following the standard protocol without intravenous contrast.  Multiplanar CT image reconstructions of the cervical spine were also generated.  Comparison:  Headache, cervical and maxillofacial CT scan 08/25/2010.  CT HEAD  Findings: The brain demonstrates cortical atrophy and chronic microvascular ischemic change.  There is no evidence of acute abnormality including infarction, hemorrhage, mass, mass effect, midline shift or abnormal extra-axial fluid collection.  No hydrocephalus or pneumocephalus.  The calvarium is intact.  IMPRESSION:  1.  No acute finding. 2.  Atrophy and chronic microvascular ischemic change  CT CERVICAL SPINE  Findings: There is no fracture subluxation of the cervical spine. The patient has only mild degenerative change.  Lung apices are clear.  IMPRESSION: Negative exam.   Original Report Authenticated By: Holley Dexter, M.D.   Dg Shoulder  Left  06/02/2012   *RADIOLOGY REPORT*  Clinical Data: Left shoulder pain after fall.  LEFT SHOULDER - 2+ VIEW  Comparison: Single view of the left shoulder 06/18/2005.  Findings: The humerus is located and the acromioclavicular joint is intact with some degenerative change noted.  There is no fracture. Imaged left lung and ribs are unremarkable.  IMPRESSION: No acute abnormality.   Original Report Authenticated By: Holley Dexter, M.D.    Date: 06/02/2012  [id number]  Rhythm: wide complex tachycardia  QRS Axis: left  Intervals: PR shortened  ST/T Wave abnormalities: normal  Conduction Disutrbances:right bundle branch block  Narrative Interpretation: No STEMI  Old EKG Reviewed: changes noted and QRS unchanged from Aug. 24, 2012   No diagnosis found.    MDM  I have reviewed nursing notes, vital signs, and all appropriate lab and imaging results for this patient. 9:54am - Pt resting comfortable. B/p 89 systolic. Heart rate 139. Pt seen with me by Dr Rosalia Hammers. EKG and labs and monitor ordered. No change in mentation. CT head and neck neg for acute changes. Xray of the shoulder is negative for fx. EKG reveals wide QRS tachycardia, left axis deviation, and RBBB.  10:44am Pt resting comfortable. B/p 84 systolic. Heart rate 134.  11:17 pt request to eat. Lunch ordered. Trop <0.30.   1218pm Heart rate down to 86. B/p 98 systolic. Heart rate went back up to 136, and b/p down to 84 systolic. UA wnl.  Spoke to Dr Karilyn Cota for admission. EKG reviewed by Dr Gracy Racer and felt to be 2 to 1 flutter. Given in instability of the of the b/p it was the opinion that the patiient should be  Transferred to the [facility].   Case reviewed with Dr Excell Seltzer. He will accept pt to step down at the [facility].  Kathie Dike, PA-C 06/02/12 1653

## 2012-06-02 NOTE — ED Notes (Signed)
[facility] now. Attempted to call pt's friend but pt cannot remember #

## 2012-06-02 NOTE — ED Notes (Signed)
Pt clothing sent with pt. Tan pants, light blue tee shirt, navy jacket with hood, wallet with no money and brown boots

## 2012-06-02 NOTE — ED Notes (Signed)
Report given to [facility] on 2600.

## 2012-06-02 NOTE — ED Notes (Signed)
Resting with eyes closed.

## 2012-06-02 NOTE — ED Notes (Signed)
Pt resting, states he does not want to be admitted to the hospital. Is also worried about getting back home. States he has room mates but nobody drives

## 2012-06-02 NOTE — ED Notes (Signed)
Pt state he walked to the store to get something to eat. States his legs give out a lot and that is what happened to him today. Pt states he stumbled into a metal object on a door framing at he store and fell. Denies LOC or other symptoms

## 2012-06-02 NOTE — Progress Notes (Signed)
PHARMACIST - PHYSICIAN COMMUNICATION DR:  Hochrein/Team CONCERNING:  METFORMIN SAFE ADMINISTRATION POLICY  RECOMMENDATION: Metformin has been placed on DISCONTINUE (rejected order) STATUS and should be reordered only after any of the conditions below are ruled out.  Current safety recommendations include avoiding metformin for a minimum of 48 hours after the patient's exposure to intravenous contrast media.  DESCRIPTION:  The Pharmacy Committee has adopted a policy that restricts the use of metformin in hospitalized patients until all the contraindications to administration have been ruled out. Specific contraindications are: [x]  Serum creatinine ? 1.5 for males []  Serum creatinine ? 1.4 for females []  Shock, acute MI, sepsis, hypoxemia, dehydration []  Planned administration of intravenous iodinated contrast media []  Heart Failure patients with low EF []  Acute or chronic metabolic acidosis (including DKA)

## 2012-06-03 ENCOUNTER — Encounter (HOSPITAL_COMMUNITY): Payer: Self-pay | Admitting: Nurse Practitioner

## 2012-06-03 DIAGNOSIS — I059 Rheumatic mitral valve disease, unspecified: Secondary | ICD-10-CM

## 2012-06-03 DIAGNOSIS — E785 Hyperlipidemia, unspecified: Secondary | ICD-10-CM | POA: Diagnosis present

## 2012-06-03 DIAGNOSIS — I10 Essential (primary) hypertension: Secondary | ICD-10-CM | POA: Diagnosis present

## 2012-06-03 DIAGNOSIS — I2510 Atherosclerotic heart disease of native coronary artery without angina pectoris: Secondary | ICD-10-CM | POA: Diagnosis present

## 2012-06-03 DIAGNOSIS — I484 Atypical atrial flutter: Secondary | ICD-10-CM | POA: Diagnosis present

## 2012-06-03 LAB — BASIC METABOLIC PANEL
Chloride: 107 mEq/L (ref 96–112)
GFR calc Af Amer: 64 mL/min — ABNORMAL LOW (ref >90)
Potassium: 3.6 mEq/L (ref 3.5–5.1)

## 2012-06-03 LAB — HEMOGLOBIN A1C: Hgb A1c MFr Bld: 5.5 % (ref <5.7)

## 2012-06-03 LAB — GLUCOSE, CAPILLARY: Glucose-Capillary: 109 mg/dL — ABNORMAL HIGH (ref 70–99)

## 2012-06-03 LAB — TSH: TSH: 0.305 u[IU]/mL — ABNORMAL LOW (ref 0.350–4.500)

## 2012-06-03 MED ORDER — GLIPIZIDE 5 MG PO TABS
5.0000 mg | ORAL_TABLET | Freq: Every day | ORAL | Status: DC
  Administered 2012-06-04: 5 mg via ORAL
  Filled 2012-06-03 (×2): qty 1

## 2012-06-03 MED ORDER — SENNA 8.6 MG PO TABS
1.0000 | ORAL_TABLET | Freq: Two times a day (BID) | ORAL | Status: DC | PRN
  Administered 2012-06-04: 17.2 mg via ORAL
  Filled 2012-06-03: qty 2

## 2012-06-03 MED ORDER — METOPROLOL SUCCINATE ER 50 MG PO TB24
75.0000 mg | ORAL_TABLET | Freq: Every day | ORAL | Status: DC
  Administered 2012-06-04: 75 mg via ORAL
  Filled 2012-06-03 (×2): qty 1

## 2012-06-03 MED ORDER — DEXTROSE 50 % IV SOLN
INTRAVENOUS | Status: AC
  Filled 2012-06-03: qty 50

## 2012-06-03 MED ORDER — DEXTROSE 50 % IV SOLN
50.0000 mL | Freq: Once | INTRAVENOUS | Status: AC | PRN
  Administered 2012-06-03: 50 mL via INTRAVENOUS

## 2012-06-03 NOTE — Progress Notes (Signed)
Utilization Review Completed. 06/03/2012  

## 2012-06-03 NOTE — Progress Notes (Signed)
SUBJECTIVE:  No pain,  No SOB.  He is sleepy.   PHYSICAL EXAM Filed Vitals:   06/03/12 0400 06/03/12 0616 06/03/12 0619 06/03/12 0823  BP: 118/64   125/80  Pulse: 70 93 93 93  Temp: 99.1 F (37.3 C)   98.5 F (36.9 C)  TempSrc: Oral     Resp: 23 24 27 18   Height:      Weight: 166 lb 14.2 oz (75.7 kg)     SpO2: 94% 90% 95%    General:  No distress Lungs:  Decreased breath sounds  Heart:  Irregular, murmur unchanged Abdomen:  Positive bowel sounds, no rebound no guarding Extremities:  No edema.  LABS: Lab Results  Component Value Date   TROPONINI <0.30 06/02/2012   Results for orders placed during the hospital encounter of 06/02/12 (from the past 24 hour(s))  GLUCOSE, CAPILLARY     Status: None   Collection Time    06/02/12  9:20 AM      Result Value Range   Glucose-Capillary 91  70 - 99 mg/dL  CBC WITH DIFFERENTIAL     Status: Abnormal   Collection Time    06/02/12 10:03 AM      Result Value Range   WBC 8.9  4.0 - 10.5 K/uL   RBC 4.82  4.22 - 5.81 MIL/uL   Hemoglobin 13.6  13.0 - 17.0 g/dL   HCT 47.8  29.5 - 62.1 %   MCV 81.7  78.0 - 100.0 fL   MCH 28.2  26.0 - 34.0 pg   MCHC 34.5  30.0 - 36.0 g/dL   RDW 30.8  65.7 - 84.6 %   Platelets 180  150 - 400 K/uL   Neutrophils Relative % 82 (*) 43 - 77 %   Neutro Abs 7.4  1.7 - 7.7 K/uL   Lymphocytes Relative 9 (*) 12 - 46 %   Lymphs Abs 0.8  0.7 - 4.0 K/uL   Monocytes Relative 7  3 - 12 %   Monocytes Absolute 0.7  0.1 - 1.0 K/uL   Eosinophils Relative 1  0 - 5 %   Eosinophils Absolute 0.1  0.0 - 0.7 K/uL   Basophils Relative 0  0 - 1 %   Basophils Absolute 0.0  0.0 - 0.1 K/uL  BASIC METABOLIC PANEL     Status: Abnormal   Collection Time    06/02/12 10:03 AM      Result Value Range   Sodium 139  135 - 145 mEq/L   Potassium 3.3 (*) 3.5 - 5.1 mEq/L   Chloride 100  96 - 112 mEq/L   CO2 24  19 - 32 mEq/L   Glucose, Bld 89  70 - 99 mg/dL   BUN 18  6 - 23 mg/dL   Creatinine, Ser 9.62 (*) 0.50 - 1.35 mg/dL   Calcium 9.5  8.4 - 95.2 mg/dL   GFR calc non Af Amer 46 (*) >90 mL/min   GFR calc Af Amer 53 (*) >90 mL/min  TROPONIN I     Status: None   Collection Time    06/02/12 10:03 AM      Result Value Range   Troponin I <0.30  <0.30 ng/mL  ETHANOL     Status: None   Collection Time    06/02/12 11:12 AM      Result Value Range   Alcohol, Ethyl (B) <11  0 - 11 mg/dL  URINALYSIS, ROUTINE W REFLEX MICROSCOPIC     Status:  None   Collection Time    06/02/12 12:59 PM      Result Value Range   Color, Urine YELLOW  YELLOW   APPearance CLEAR  CLEAR   Specific Gravity, Urine 1.015  1.005 - 1.030   pH 5.0  5.0 - 8.0   Glucose, UA NEGATIVE  NEGATIVE mg/dL   Hgb urine dipstick NEGATIVE  NEGATIVE   Bilirubin Urine NEGATIVE  NEGATIVE   Ketones, ur NEGATIVE  NEGATIVE mg/dL   Protein, ur NEGATIVE  NEGATIVE mg/dL   Urobilinogen, UA 0.2  0.0 - 1.0 mg/dL   Nitrite NEGATIVE  NEGATIVE   Leukocytes, UA NEGATIVE  NEGATIVE  MRSA PCR SCREENING     Status: None   Collection Time    06/02/12  6:49 PM      Result Value Range   MRSA by PCR NEGATIVE  NEGATIVE  TSH     Status: Abnormal   Collection Time    06/02/12 10:40 PM      Result Value Range   TSH 0.305 (*) 0.350 - 4.500 uIU/mL  MAGNESIUM     Status: None   Collection Time    06/02/12 10:40 PM      Result Value Range   Magnesium 2.2  1.5 - 2.5 mg/dL  COMPREHENSIVE METABOLIC PANEL     Status: Abnormal   Collection Time    06/02/12 10:40 PM      Result Value Range   Sodium 140  135 - 145 mEq/L   Potassium 3.9  3.5 - 5.1 mEq/L   Chloride 104  96 - 112 mEq/L   CO2 27  19 - 32 mEq/L   Glucose, Bld 156 (*) 70 - 99 mg/dL   BUN 16  6 - 23 mg/dL   Creatinine, Ser 9.56 (*) 0.50 - 1.35 mg/dL   Calcium 8.9  8.4 - 21.3 mg/dL   Total Protein 6.5  6.0 - 8.3 g/dL   Albumin 3.3 (*) 3.5 - 5.2 g/dL   AST 17  0 - 37 U/L   ALT 10  0 - 53 U/L   Alkaline Phosphatase 102  39 - 117 U/L   Total Bilirubin 0.4  0.3 - 1.2 mg/dL   GFR calc non Af Amer 48 (*) >90  mL/min   GFR calc Af Amer 55 (*) >90 mL/min  CBC     Status: None   Collection Time    06/02/12 10:40 PM      Result Value Range   WBC 8.6  4.0 - 10.5 K/uL   RBC 4.80  4.22 - 5.81 MIL/uL   Hemoglobin 13.6  13.0 - 17.0 g/dL   HCT 08.6  57.8 - 46.9 %   MCV 84.0  78.0 - 100.0 fL   MCH 28.3  26.0 - 34.0 pg   MCHC 33.7  30.0 - 36.0 g/dL   RDW 62.9  52.8 - 41.3 %   Platelets 174  150 - 400 K/uL  HEMOGLOBIN A1C     Status: None   Collection Time    06/02/12 10:40 PM      Result Value Range   Hemoglobin A1C 5.5  <5.7 %   Mean Plasma Glucose 111  <117 mg/dL  PRO B NATRIURETIC PEPTIDE     Status: Abnormal   Collection Time    06/02/12 10:40 PM      Result Value Range   Pro B Natriuretic peptide (BNP) 5064.0 (*) 0 - 125 pg/mL  BASIC METABOLIC PANEL  Status: Abnormal   Collection Time    06/03/12  3:40 AM      Result Value Range   Sodium 141  135 - 145 mEq/L   Potassium 3.6  3.5 - 5.1 mEq/L   Chloride 107  96 - 112 mEq/L   CO2 23  19 - 32 mEq/L   Glucose, Bld 37 (*) 70 - 99 mg/dL   BUN 13  6 - 23 mg/dL   Creatinine, Ser 1.61  0.50 - 1.35 mg/dL   Calcium 8.8  8.4 - 09.6 mg/dL   GFR calc non Af Amer 55 (*) >90 mL/min   GFR calc Af Amer 64 (*) >90 mL/min  GLUCOSE, CAPILLARY     Status: Abnormal   Collection Time    06/03/12  6:00 AM      Result Value Range   Glucose-Capillary 157 (*) 70 - 99 mg/dL    Intake/Output Summary (Last 24 hours) at 06/03/12 0454 Last data filed at 06/03/12 0500  Gross per 24 hour  Intake    120 ml  Output   2000 ml  Net  -1880 ml    ASSESSMENT AND PLAN:  ATRIAL FLUTTER:   Rate is high and I will increase the beta blocker..  As stated in the HPI I do not think that he is a warfarin candidate.  Continue current therapy.    CAD:  No evidence of active ischemia.   CKD:  Creat improved.    ISCHEMIC CARDIOMYOPATHY:  Echo pending.  BNP is elevated.   DIABETES:  A1c 5.5.  Continue current therapy.   DECREASED TSH:  Borderline.  Check T3,  T4  FALLS:  PT consult.   Fayrene Fearing [facility] 06/03/2012 9:09 AM

## 2012-06-03 NOTE — Progress Notes (Signed)
Hypoglycemic Event  CBG: 37  Treatment: D5W-40ml  Symptoms: drowsy, pt easily arousable. No other symptoms  Follow-up CBG: Time:0545 CBG Result:157  Possible Reasons for Event: poor appetite,   Comments/MD notified: Cards NP paged.    Drue Flirt  Remember to initiate Hypoglycemia Order Set & complete

## 2012-06-03 NOTE — Progress Notes (Signed)
Inpatient Diabetes Program Recommendations  AACE/ADA: New Consensus Statement on Inpatient Glycemic Control (2013)  Target Ranges:  Prepandial:   less than 140 mg/dL      Peak postprandial:   less than 180 mg/dL (1-2 hours)      Critically ill patients:  140 - 180 mg/dL   Hypoglycemia this morning lab glucose=37 Results for [name], [name] (MRN [id number]) as of 06/03/2012 10:20  Ref. Range 06/03/2012 03:40  Glucose Latest Range: 70-99 mg/dL 37 (LL)    Inpatient Diabetes Program Recommendations Oral Agents: discontinue GLUCOTROL during hospitalization  HgbA1C: =5.5  Will follow. Thank you  Piedad Climes BSN, RN,CDE Inpatient Diabetes Coordinator [phone] (team pager)

## 2012-06-03 NOTE — Progress Notes (Signed)
INITIAL NUTRITION ASSESSMENT  DOCUMENTATION CODES Per approved criteria  -Not Applicable   INTERVENTION:  No nutrition intervention at this time ---> patient declined RD to follow for nutrition care plan  NUTRITION DIAGNOSIS: Inadequate oral intake related to decreased appetite as evidenced by chart review  Goal: Oral intake with meals to meet >/= 90% of estimated nutrition needs  Monitor:  PO intake, weight, labs, I/O's  Reason for Assessment: Malnutrition Screening Tool Report  67 y.o. male  Admitting Dx: s/p fall  ASSESSMENT: Patient presented to [facility] ER after fall, transferred to [facility]; in ER had a rapid rate of 130s with SBP 89; head CT demonstrated no acute changes; followup EKG demonstrated variable conduction with probable atypical flutter.  Patient reports he's eating "ok;" no % PO intake per flowsheet records; per comprehensive flowsheet, appetite fair; patient states he's lost 10 lbs, however, time frame unknown; declining addition of nutrition supplements and/or snacks.  Height: Ht Readings from Last 1 Encounters:  06/02/12 6\' 5"  (1.956 m)    Weight: Wt Readings from Last 1 Encounters:  06/03/12 166 lb 14.2 oz (75.7 kg)    Ideal Body Weight: 208 lb  % Ideal Body Weight: 80%  Wt Readings from Last 10 Encounters:  06/03/12 166 lb 14.2 oz (75.7 kg)  12/17/10 183 lb (83.008 kg)  11/16/10 182 lb (82.555 kg)  10/23/10 185 lb (83.915 kg)  08/25/10 187 lb (84.823 kg)    Usual Body Weight: ---  % Usual Body Weight: ---  BMI:  Body mass index is 19.79 kg/(m^2).  Estimated Nutritional Needs: Kcal: 1900-2100 Protein: 95-105 gm Fluid: 1.9-2.1 L  Skin: Intact  Diet Order: Cardiac  EDUCATION NEEDS: -No education needs identified at this time   Intake/Output Summary (Last 24 hours) at 06/03/12 1143 Last data filed at 06/03/12 0500  Gross per 24 hour  Intake    120 ml  Output   2000 ml  Net  -1880 ml    Labs:   Recent Labs Lab 06/02/12 1003  06/02/12 2240 06/03/12 0340  NA 139 140 141  K 3.3* 3.9 3.6  CL 100 104 107  CO2 24 27 23   BUN 18 16 13   CREATININE 1.51* 1.46* 1.29  CALCIUM 9.5 8.9 8.8  MG  --  2.2  --   GLUCOSE 89 156* 37*    CBG (last 3)   Recent Labs  06/02/12 0920 06/03/12 0600  GLUCAP 91 157*    Scheduled Meds: . aspirin EC  81 mg Oral Daily  . atorvastatin  40 mg Oral q1800  . dextrose      . glipiZIDE  5 mg Oral Daily  . heparin  5,000 Units Subcutaneous Q8H  . metoprolol succinate  75 mg Oral Daily  . niacin  500 mg Oral QHS  . pneumococcal 23 valent vaccine  0.5 mL Intramuscular Tomorrow-1000  . sodium chloride  500 mL Intravenous Once  . sodium chloride  3 mL Intravenous Q12H    Continuous Infusions:   Past Medical History  Diagnosis Date  . CHF (congestive heart failure)   . Coronary artery disease     Cath in 2006.  He had occluded vein graft to the PDA. Patent LIMA LAD, patent SVG to Diag, patent SVG to OM, 70%proximal LAD, occluded OM, 80% ostial RCA.  RCA stented with Taxus stent.  . Diabetes mellitus   . Hyperlipidemia   . Hypertension   . Atypical atrial flutter     Past Surgical History  Procedure  Laterality Date  . Coronary artery bypass graft      2004    Maureen Chatters, RD, LDN Pager #: [phone] After-Hours Pager #: [phone]

## 2012-06-03 NOTE — Progress Notes (Signed)
  Echocardiogram 2D Echocardiogram has been performed.  Georgian Co 06/03/2012, 10:02 AM

## 2012-06-03 NOTE — Progress Notes (Signed)
Report called to [facility] on unit 3W. Patient transferred to unit 3W Rm 41 via wheelchair

## 2012-06-04 ENCOUNTER — Encounter (HOSPITAL_COMMUNITY): Payer: Self-pay | Admitting: Physician Assistant

## 2012-06-04 DIAGNOSIS — I2510 Atherosclerotic heart disease of native coronary artery without angina pectoris: Secondary | ICD-10-CM

## 2012-06-04 LAB — BASIC METABOLIC PANEL
BUN: 10 mg/dL (ref 6–23)
Chloride: 107 mEq/L (ref 96–112)
GFR calc Af Amer: 73 mL/min — ABNORMAL LOW (ref >90)
Glucose, Bld: 81 mg/dL (ref 70–99)
Potassium: 3.4 mEq/L — ABNORMAL LOW (ref 3.5–5.1)

## 2012-06-04 LAB — GLUCOSE, CAPILLARY: Glucose-Capillary: 89 mg/dL (ref 70–99)

## 2012-06-04 MED ORDER — ASPIRIN 81 MG PO TBEC: 81.0000 mg | DELAYED_RELEASE_TABLET | Freq: Every day | ORAL | Status: DC

## 2012-06-04 MED ORDER — AMLODIPINE BESYLATE 10 MG PO TABS: 10.0000 mg | ORAL_TABLET | Freq: Every day | ORAL | Status: DC

## 2012-06-04 MED ORDER — AMLODIPINE BESYLATE 10 MG PO TABS
10.0000 mg | ORAL_TABLET | Freq: Every day | ORAL | Status: DC
  Administered 2012-06-04: 10 mg via ORAL
  Filled 2012-06-04: qty 1

## 2012-06-04 MED ORDER — METOPROLOL SUCCINATE ER 50 MG PO TB24: ORAL_TABLET | ORAL | Status: DC

## 2012-06-04 MED ORDER — ALPRAZOLAM 0.5 MG PO TABS
1.0000 mg | ORAL_TABLET | Freq: Once | ORAL | Status: AC
  Administered 2012-06-04: 1 mg via ORAL

## 2012-06-04 MED ORDER — POTASSIUM CHLORIDE CRYS ER 20 MEQ PO TBCR
40.0000 meq | EXTENDED_RELEASE_TABLET | Freq: Once | ORAL | Status: AC
  Administered 2012-06-04: 40 meq via ORAL
  Filled 2012-06-04: qty 2

## 2012-06-04 NOTE — Progress Notes (Signed)
Patient was experiencing moderate anxiety, and requested medication.  Discussed ordered medication and dose with patient, who stated that the ordered dose would not really help him. He requested an increased dose.  Current order is for alprazolam 0.25mg  PO three times daily PRN for anxiety. Patient takes alprazolam 1mg  PO every 6 hours as needed for anxiety at home; states that he takes this regularly.  Spoke with Dr. Adolm Joseph, on-call for Dr. Excell Seltzer. 1-time order for alprazolam 1mg  PO received.

## 2012-06-04 NOTE — Care Management Note (Signed)
    Page 1 of 1   06/04/2012     10:39:54 AM   CARE MANAGEMENT NOTE 06/04/2012  Patient:  [name], [name]   Account Number:  [id number]  Date Initiated:  06/04/2012  Documentation initiated by:  GRAVES-BIGELOW,Girtha Kilgore  Subjective/Objective Assessment:   Pt admitted after a fall. Pt states he lives with some friends in [place].     Action/Plan:   CM did speak to pt in reference to [facility] services and he is refusing HH services at this time. No further needs from CM at this time.   Anticipated DC Date:  06/04/2012   Anticipated DC Plan:  HOME/SELF CARE      DC Planning Services  CM consult  Patient refused services      [facility] Choice  HOME HEALTH   Choice offered to / List presented to:             Status of service:  Completed, signed off Medicare Important Message given?   (If response is "NO", the following Medicare IM given date fields will be blank) Date Medicare IM given:   Date Additional Medicare IM given:    Discharge Disposition:  HOME/SELF CARE  Per UR Regulation:  Reviewed for med. necessity/level of care/duration of stay  If discussed at Long Length of Stay Meetings, dates discussed:    Comments:

## 2012-06-04 NOTE — Progress Notes (Signed)
SUBJECTIVE:  No pain,  No SOB.     PHYSICAL EXAM Filed Vitals:   06/03/12 1220 06/03/12 1345 06/03/12 2100 06/04/12 0607  BP: 97/64 130/87 152/88 152/94  Pulse: 75 81 102 81  Temp: 98.6 F (37 C) 98.4 F (36.9 C) 98.2 F (36.8 C) 97.9 F (36.6 C)  TempSrc: Oral Oral Oral Oral  Resp: 18 18 20 20   Height:      Weight:    154 lb 9.6 oz (70.126 kg)  SpO2: 100% 98% 96% 94%   General:  No distress Lungs:  Decreased breath sounds  Heart:  Irregular, murmur unchanged Abdomen:  Positive bowel sounds, no rebound no guarding Extremities:  No edema.  LABS: Lab Results  Component Value Date   TROPONINI <0.30 06/02/2012   Results for orders placed during the hospital encounter of 06/02/12 (from the past 24 hour(s))  GLUCOSE, CAPILLARY     Status: Abnormal   Collection Time    06/03/12  4:28 PM      Result Value Range   Glucose-Capillary 109 (*) 70 - 99 mg/dL   Comment 1 Notify RN    GLUCOSE, CAPILLARY     Status: None   Collection Time    06/03/12  8:33 PM      Result Value Range   Glucose-Capillary 85  70 - 99 mg/dL   Comment 1 Notify RN    BASIC METABOLIC PANEL     Status: Abnormal   Collection Time    06/04/12  3:45 AM      Result Value Range   Sodium 140  135 - 145 mEq/L   Potassium 3.4 (*) 3.5 - 5.1 mEq/L   Chloride 107  96 - 112 mEq/L   CO2 23  19 - 32 mEq/L   Glucose, Bld 81  70 - 99 mg/dL   BUN 10  6 - 23 mg/dL   Creatinine, Ser 1.61  0.50 - 1.35 mg/dL   Calcium 9.1  8.4 - 09.6 mg/dL   GFR calc non Af Amer 63 (*) >90 mL/min   GFR calc Af Amer 73 (*) >90 mL/min  GLUCOSE, CAPILLARY     Status: None   Collection Time    06/04/12  7:33 AM      Result Value Range   Glucose-Capillary 89  70 - 99 mg/dL   Comment 1 Notify RN      Intake/Output Summary (Last 24 hours) at 06/04/12 0854 Last data filed at 06/04/12 0430  Gross per 24 hour  Intake      0 ml  Output   1150 ml  Net  -1150 ml    ASSESSMENT AND PLAN:  ATRIAL FLUTTER:   Back in NSR.  He is not a  warfarin or NOAC candidate for reasons already discussed.   Beta blocker dose has been increased.   CAD:  No evidence of active ischemia.   AS:  Very calcified valve.  However, he is not having symptoms related to this and the gradient is not high.  I will follow with further echoes and clinically in the future.   CKD:  Creat improved.    ISCHEMIC CARDIOMYOPATHY:  EF OK.  BNP is elevated. No further work up.   DIABETES:  A1c 5.5.  Continue current therapy.   DECREASED TSH:  Borderline.  T4 pending  FALLS:  PT consulted.  Please assess gate and any recommendations for outpatient therapy/precautions.     HTN:  BP is increased.  Restart amlodipine  at previous dose.  Still holding ACE inhibitor.    OK to discharge.  Follow up in [place].    Fayrene Fearing [facility] 06/04/2012 8:54 AM

## 2012-06-04 NOTE — Discharge Summary (Signed)
Discharge Summary   Patient ID: [name] MRN: [id number], DOB/AGE: [birth year] 67 y.o. Admit date: 06/02/2012 D/C date:     06/04/2012  Primary Cardiologist: Anderson Malta to a [place] doctor yet  Primary Discharge Diagnoses:  1. Atrial flutter, newly recognized - not an anticoagulation candidate at present time 2. Fall 3. Aortic stenosis - very calcified but peak grad 18, mean gradient , to be followed clinically 4. Acute renal insufficiency, resolved with hydration 5. Ischemic cardiomyopathy - EF improved to 55% this admission by echo - EF 40% at time of CABG 7. Diabetes mellitus - Metformin discontinued in setting of episode of hypoglycemia, for OP f/u 8. Decreased TSH - instructed to f/u PCP 9. HTN 10. Difficult social situation  Secondary Discharge Diagnoses:  1. CAD - history: CABG in 2004 - LIMA to LAD, SVG to diag, SVG to OM, SVG to PDA. EF was 40%; Cath 2006 s/p DES to prox RCA. 2. Cerebral hemorrhage - at time of CABG - had post-op encephalopathy and small frontal hemorrhage in 2004.   Hospital Course: [name] ins a 67 y/o M with history of CAD s/p CABG and stenting, HTN, HL, DM who presented initially to [facility] 06/02/12 with complaint of a fall while walking to the store. His "legs went out" and he hit a metal door frame. He did not pass out or feel as though he was going to. He walks unassisted and has to walk to get from place to place as he has no car. Upon arrival to the ER, he was found to be in rapid atrial flutter with HR in the 130's which was a new diagnosis for him. Head CT demonstrated no acute changes. He denied any rapid heart rate sensation. He does occasionally get lightheaded with standing. He denied any chest pressure, neck or arm discomfort, shortness of breath, PND or orthopnea. The duration of his atrial flutter was unclear. He was treated with beta blocker therapy. It was suspected he was somewhat dehydrated with Cr 1.51 thus Lasix was held  and he was given IV fluids. The benazepril component of his Lotrel was also held. Troponin was negative. 2D Echo 06/03/12 showed EF 55%, mild LVH, mild MR, mildly dilated LA, and the aortic valve was abnormal. This is described as: "It does not open much (unless the left cusp opens better than I can see). Yet the gradients are low. The EF is only mildly reduced. This warrants further consideration. Does the patient clinically fit severe AS with low gradient and preserved EF.? More hemodynamic data may be needed. Mean gradient: 10mm Hg (S). Peak gradient: 18mm Hg (S)." Dr. Antoine Poche would like to follow clinically with further echoes in the future. The patient is not currently symptomatic from this. With an increase in beta blocker, the patient spontaneously converted to NSR. He feels better today. Cr is back to WNL. He was instructed to f/u PCP for 2 issues: 1) TSH was low, free T4 pending. 2) Diabetes management - metformin was discontinued this admission due to low CBG one occasion. A1C was 5.5. He was instructed to check CBG regularly and contact PCP if any low readings. Dr. Antoine Poche has seen and examined him today and feels he is stable for discharge.  With regard to anticoagulation, in the setting of falling and a tenuous social situation, Dr. Antoine Poche felt he would likely be high risk for systemic anticoagulation. Thus he is treated with ASA alone. Plavix was discontinued this admission. He was seen by care  management and social work this admission for assistance but he declined home health. He was seen by PT and was not felt to require any outpatient services.   Discharge Vitals: Blood pressure 119/76, pulse 53, temperature 97.9 F (36.6 C), temperature source Oral, resp. rate 20, height 6\' 5"  (1.956 m), weight 154 lb 9.6 oz (70.126 kg), SpO2 94.00%.  Labs: Lab Results  Component Value Date   WBC 8.6 06/02/2012   HGB 13.6 06/02/2012   HCT 40.3 06/02/2012   MCV 84.0 06/02/2012   PLT 174 06/02/2012    Recent  Labs Lab 06/02/12 2240  06/04/12 0345  NA 140  < > 140  K 3.9  < > 3.4*  CL 104  < > 107  CO2 27  < > 23  BUN 16  < > 10  CREATININE 1.46*  < > 1.16  CALCIUM 8.9  < > 9.1  PROT 6.5  --   --   BILITOT 0.4  --   --   ALKPHOS 102  --   --   ALT 10  --   --   AST 17  --   --   GLUCOSE 156*  < > 81  < > = values in this interval not displayed.  Recent Labs  06/02/12 1003  TROPONINI <0.30     Diagnostic Studies/Procedures   Ct Head Wo Contrast6/02/2012   *RADIOLOGY REPORT*  Clinical Data:  Fall with a blow to the left side of the head and face.  CT HEAD WITHOUT CONTRAST CT CERVICAL SPINE WITHOUT CONTRAST  Technique:  Multidetector CT imaging of the head and cervical spine was performed following the standard protocol without intravenous contrast.  Multiplanar CT image reconstructions of the cervical spine were also generated.  Comparison:  Headache, cervical and maxillofacial CT scan 08/25/2010.  CT HEAD  Findings: The brain demonstrates cortical atrophy and chronic microvascular ischemic change.  There is no evidence of acute abnormality including infarction, hemorrhage, mass, mass effect, midline shift or abnormal extra-axial fluid collection.  No hydrocephalus or pneumocephalus.  The calvarium is intact.  IMPRESSION:  1.  No acute finding. 2.  Atrophy and chronic microvascular ischemic change  CT CERVICAL SPINE  Findings: There is no fracture subluxation of the cervical spine. The patient has only mild degenerative change.  Lung apices are clear.  IMPRESSION: Negative exam.   Original Report Authenticated By: Holley Dexter, M.D.   Ct Cervical Spine Wo Contrast6/02/2012   *RADIOLOGY REPORT*  Clinical Data:  Fall with a blow to the left side of the head and face.  CT HEAD WITHOUT CONTRAST CT CERVICAL SPINE WITHOUT CONTRAST  Technique:  Multidetector CT imaging of the head and cervical spine was performed following the standard protocol without intravenous contrast.  Multiplanar CT image  reconstructions of the cervical spine were also generated.  Comparison:  Headache, cervical and maxillofacial CT scan 08/25/2010.  CT HEAD  Findings: The brain demonstrates cortical atrophy and chronic microvascular ischemic change.  There is no evidence of acute abnormality including infarction, hemorrhage, mass, mass effect, midline shift or abnormal extra-axial fluid collection.  No hydrocephalus or pneumocephalus.  The calvarium is intact.  IMPRESSION:  1.  No acute finding. 2.  Atrophy and chronic microvascular ischemic change  CT CERVICAL SPINE  Findings: There is no fracture subluxation of the cervical spine. The patient has only mild degenerative change.  Lung apices are clear.  IMPRESSION: Negative exam.   Original Report Authenticated By: Holley Dexter, M.D.  Dg Chest Port 1 View6/02/2012   *RADIOLOGY REPORT*  Clinical Data: Status post fall.  Tachycardia.  PORTABLE CHEST - 1 VIEW  Comparison: CT chest 08/25/2010 and PA and lateral chest 09/13/2011.  Findings: Streaky bibasilar airspace opacities are most compatible with atelectasis.  Lungs otherwise clear.  No pneumothorax.  Heart size normal.  Remote right rib fractures noted.  IMPRESSION: Bibasilar atelectasis.  No acute abnormality.   Original Report Authenticated By: Holley Dexter, M.D.   Dg Shoulder Left6/02/2012   *RADIOLOGY REPORT*  Clinical Data: Left shoulder pain after fall.  LEFT SHOULDER - 2+ VIEW  Comparison: Single view of the left shoulder 06/18/2005.  Findings: The humerus is located and the acromioclavicular joint is intact with some degenerative change noted.  There is no fracture. Imaged left lung and ribs are unremarkable.  IMPRESSION: No acute abnormality.   Original Report Authenticated By: Holley Dexter, M.D.   2D Echo 06/03/12 - Left ventricle: Hypokinesis base inferior segment. Study is technically limited. The cavity size was normal. Wall thickness was increased in a pattern of mild LVH. The estimated ejection  fraction was 55%. - Aortic valve: The aortic valve is abnormal. It does not open much (unless the left cusp opens better than I can see). Yet the gradients are low. The EF is only mildly reduced. This warrants further consideration. Does the patient clinically fit severe AS with low gradient and preserved EF.? More hemodynamic data may be needed. Mean gradient: 10mm Hg (S). Peak gradient: 18mm Hg (S). - Aorta: Mild dilitation of aortic root. Aortic root dimension: 43mm (ED). - Mitral valve: Mildly calcified annulus. Mild regurgitation. - Left atrium: The atrium was mildly dilated.   Discharge Medications     Medication List    STOP taking these medications       amLODipine-benazepril 10-20 MG per capsule  Commonly known as:  LOTREL     clopidogrel 75 MG tablet  Commonly known as:  PLAVIX     furosemide 20 MG tablet  Commonly known as:  LASIX     metFORMIN 500 MG tablet  Commonly known as:  GLUCOPHAGE      TAKE these medications       ALPRAZolam 1 MG tablet  Commonly known as:  XANAX  Take 1 mg by mouth every 6 (six) hours as needed. For anxiety     amLODipine 10 MG tablet  Commonly known as:  NORVASC  Take 1 tablet (10 mg total) by mouth daily.     aspirin 81 MG EC tablet  Take 1 tablet (81 mg total) by mouth daily.     fluticasone 50 MCG/ACT nasal spray  Commonly known as:  FLONASE  Place 2 sprays into the nose daily as needed. congestion     glipiZIDE 5 MG tablet  Commonly known as:  GLUCOTROL  Take 5 mg by mouth daily.     metoprolol succinate 50 MG 24 hr tablet  Commonly known as:  TOPROL-XL  Take 1 and a half tablets (75mg ) by mouth daily.     niacin 500 MG CR tablet  Commonly known as:  NIASPAN  Take 500 mg by mouth at bedtime.     oxyCODONE-acetaminophen 10-325 MG per tablet  Commonly known as:  PERCOCET  Take 1 tablet by mouth every 4 (four) hours as needed. For pain     promethazine 25 MG tablet  Commonly known as:  PHENERGAN  Take 25 mg  by mouth every 6 (six) hours as needed. Nausea/vomiting  rosuvastatin 20 MG tablet  Commonly known as:  CRESTOR  Take 20 mg by mouth daily.        Disposition   The patient will be discharged in stable condition to home.     Discharge Orders   Future Appointments Provider Department Dept Phone   06/23/2012 2:20 PM Gerald Gross, NP [facility] [place] [phone]   Future Orders Complete By Expires     Diet - low sodium heart healthy  As directed     Comments:      Diabetic Diet    Discharge instructions  As directed     Comments:      Follow up with your primary care doctor for: 1) Your thyroid function was abnormal - TSH was low at 0.305. A Free t4 level is pending at this time. Please call your PCP to discuss further workup. 2) Diabetes mellitus as scheduled - your Metformin was discontinued this admission due to low blood sugar on one occasion. Please make sure you are checking your blood sugar regularly and call your doctor if you have any low readings. 3) If you have any further weakness or falls.    Increase activity slowly  As directed       Follow-up Information   Follow up with Gerald Reining, NP. (06/23/12 at 2:20pm)    Contact information:   [address] [place] [address] [phone]         Duration of Discharge Encounter: Greater than 30 minutes including physician and PA time.  Signed, Ronie Spies PA-C 06/04/2012, 12:28 PM   Patient seen and examined.  Plan as discussed in my rounding note for today and outlined above. [name]  06/04/2012  1:19 PM

## 2012-06-04 NOTE — Progress Notes (Addendum)
CSW received referral for possible SNF placement. CSW Spoke with MD Annabelle Harman and the pt will be going home with [facility] set for him.   Sherald Barge, LCSW-A Clinical Social Worker [phone]

## 2012-06-04 NOTE — ED Provider Notes (Signed)
History/physical exam/procedure(s) were performed by non-physician practitioner and as supervising physician I was immediately available for consultation/collaboration. I have reviewed all notes and am in agreement with care and plan.\   Hilario Quarry, MD 06/04/12 1049

## 2012-06-04 NOTE — Evaluation (Signed)
Physical Therapy Evaluation Patient Details Name: [name] MRN: [id number] DOB: Oct 17, 1945 Today's Date: 06/04/2012 Time: 4696-2952 PT Time Calculation (min): 17 min  PT Assessment / Plan / Recommendation Clinical Impression  Pt is a 67 y.o. male who presents s/p fall. Patient demonstrates very modest deficits in high level balance but overall is at independent levels for activity. No acute PT needs, No follow up recommended at this time. Patient in agreement for dc home.    PT Assessment  Patent does not need any further PT services    Follow Up Recommendations  No PT follow up          Equipment Recommendations  None recommended by PT    Recommendations for Other Services     Frequency      Precautions / Restrictions Restrictions Weight Bearing Restrictions: No   Pertinent Vitals/Pain 3/10 headache      Mobility  Bed Mobility Bed Mobility: Supine to Sit;Sitting - Scoot to Edge of Bed Supine to Sit: 7: Independent Sitting - Scoot to [place] of Bed: 7: Independent Transfers Transfers: Sit to Stand;Stand to Sit Sit to Stand: 7: Independent Stand to Sit: 7: Independent Details for Transfer Assistance:  (no assist needed) Ambulation/Gait Ambulation/Gait Assistance: 7: Independent;6: Modified independent (Device/Increase time) Ambulation Distance (Feet): 400 Feet Assistive device: None (rw for first 100 feet) Ambulation/Gait Assistance Details: steady with ambulation, no significant deficits noted Gait Pattern: Within Functional Limits Gait velocity: wfl for community ambulation General Gait Details: steady with gait      Visit Information  Last PT Received On: 06/04/12 Assistance Needed: +1    Subjective Data  Subjective: I want to get dressed and get out of bed Patient Stated Goal: to go home   Prior Functioning  Home Living Lives With: Family Available Help at Discharge: Family;Available 24 hours/day Type of Home: House Home Access: Level  entry Home Layout: One level Bathroom Shower/Tub: Engineer, manufacturing systems: Standard Home Adaptive Equipment: None Additional Comments: pt doesn not want any equipment Prior Function Level of Independence: Independent with assistive device(s) Able to Take Stairs?: Yes Driving: Yes (has license but no car) Vocation: On disability Communication Communication: No difficulties Dominant Hand: Right    Cognition  Cognition Arousal/Alertness: Awake/alert Behavior During Therapy: WFL for tasks assessed/performed Overall Cognitive Status: Within Functional Limits for tasks assessed    Extremity/Trunk Assessment Right Upper Extremity Assessment RUE ROM/Strength/Tone: [facility] for tasks assessed Left Upper Extremity Assessment LUE ROM/Strength/Tone: WFL for tasks assessed Right Lower Extremity Assessment RLE ROM/Strength/Tone: WFL for tasks assessed Left Lower Extremity Assessment LLE ROM/Strength/Tone: WFL for tasks assessed   Balance Balance Balance Assessed: Yes Standardized Balance Assessment Standardized Balance Assessment: Dynamic Gait Index Dynamic Gait Index Level Surface: Normal Change in Gait Speed: Normal Gait with Horizontal Head Turns: Normal Gait with Vertical Head Turns: Mild Impairment Gait and Pivot Turn: Normal Step Over Obstacle: Mild Impairment Step Around Obstacles: Normal Steps: Mild Impairment Total Score: 21 High Level Balance High Level Balance Activites: Side stepping;Backward walking;Direction changes;Turns;Sudden stops;Head turns High Level Balance Comments: steady throughout  End of Session PT - End of Session Equipment Utilized During Treatment: Gait belt Activity Tolerance: Patient tolerated treatment well Patient left: in chair;with call bell/phone within reach Nurse Communication: Mobility status  GP     Fabio Asa 06/04/2012, 10:50 AM  Charlotte Crumb, PT DPT  [phone]

## 2012-06-05 ENCOUNTER — Inpatient Hospital Stay (HOSPITAL_COMMUNITY)
Admission: EM | Admit: 2012-06-05 | Discharge: 2012-06-06 | DRG: 292 | Disposition: A | Discharge status: Home / Self Care | Payer: PRIVATE HEALTH INSURANCE | Attending: Internal Medicine | Admitting: Internal Medicine

## 2012-06-05 ENCOUNTER — Encounter (HOSPITAL_COMMUNITY): Payer: Self-pay | Admitting: *Deleted

## 2012-06-05 ENCOUNTER — Telehealth: Payer: Self-pay | Admitting: Nurse Practitioner

## 2012-06-05 ENCOUNTER — Emergency Department (HOSPITAL_COMMUNITY): Payer: PRIVATE HEALTH INSURANCE

## 2012-06-05 DIAGNOSIS — I4892 Unspecified atrial flutter: Secondary | ICD-10-CM | POA: Diagnosis present

## 2012-06-05 DIAGNOSIS — I509 Heart failure, unspecified: Principal | ICD-10-CM | POA: Diagnosis present

## 2012-06-05 DIAGNOSIS — Z9181 History of falling: Secondary | ICD-10-CM

## 2012-06-05 DIAGNOSIS — I350 Nonrheumatic aortic (valve) stenosis: Secondary | ICD-10-CM | POA: Diagnosis present

## 2012-06-05 DIAGNOSIS — R0989 Other specified symptoms and signs involving the circulatory and respiratory systems: Secondary | ICD-10-CM

## 2012-06-05 DIAGNOSIS — I2510 Atherosclerotic heart disease of native coronary artery without angina pectoris: Secondary | ICD-10-CM | POA: Diagnosis present

## 2012-06-05 DIAGNOSIS — J4489 Other specified chronic obstructive pulmonary disease: Secondary | ICD-10-CM | POA: Diagnosis present

## 2012-06-05 DIAGNOSIS — I359 Nonrheumatic aortic valve disorder, unspecified: Secondary | ICD-10-CM | POA: Diagnosis present

## 2012-06-05 DIAGNOSIS — Z7982 Long term (current) use of aspirin: Secondary | ICD-10-CM

## 2012-06-05 DIAGNOSIS — I10 Essential (primary) hypertension: Secondary | ICD-10-CM | POA: Diagnosis present

## 2012-06-05 DIAGNOSIS — Z87891 Personal history of nicotine dependence: Secondary | ICD-10-CM

## 2012-06-05 DIAGNOSIS — F411 Generalized anxiety disorder: Secondary | ICD-10-CM | POA: Diagnosis present

## 2012-06-05 DIAGNOSIS — Z951 Presence of aortocoronary bypass graft: Secondary | ICD-10-CM

## 2012-06-05 DIAGNOSIS — R0600 Dyspnea, unspecified: Secondary | ICD-10-CM

## 2012-06-05 DIAGNOSIS — J811 Chronic pulmonary edema: Secondary | ICD-10-CM | POA: Diagnosis present

## 2012-06-05 DIAGNOSIS — I2589 Other forms of chronic ischemic heart disease: Secondary | ICD-10-CM | POA: Diagnosis present

## 2012-06-05 DIAGNOSIS — I484 Atypical atrial flutter: Secondary | ICD-10-CM

## 2012-06-05 DIAGNOSIS — Z79899 Other long term (current) drug therapy: Secondary | ICD-10-CM

## 2012-06-05 DIAGNOSIS — E119 Type 2 diabetes mellitus without complications: Secondary | ICD-10-CM | POA: Diagnosis present

## 2012-06-05 DIAGNOSIS — J449 Chronic obstructive pulmonary disease, unspecified: Secondary | ICD-10-CM | POA: Diagnosis present

## 2012-06-05 DIAGNOSIS — E785 Hyperlipidemia, unspecified: Secondary | ICD-10-CM | POA: Diagnosis present

## 2012-06-05 LAB — CBC
Hemoglobin: 14.9 g/dL (ref 13.0–17.0)
MCH: 28.6 pg (ref 26.0–34.0)
RBC: 5.21 MIL/uL (ref 4.22–5.81)
WBC: 9.1 10*3/uL (ref 4.0–10.5)

## 2012-06-05 LAB — PRO B NATRIURETIC PEPTIDE: Pro B Natriuretic peptide (BNP): 4068 pg/mL — ABNORMAL HIGH (ref 0–125)

## 2012-06-05 LAB — BASIC METABOLIC PANEL
BUN: 15 mg/dL (ref 6–23)
Chloride: 102 mEq/L (ref 96–112)
GFR calc Af Amer: 78 mL/min — ABNORMAL LOW (ref >90)
Potassium: 3.9 mEq/L (ref 3.5–5.1)

## 2012-06-05 LAB — GLUCOSE, CAPILLARY
Glucose-Capillary: 53 mg/dL — ABNORMAL LOW (ref 70–99)
Glucose-Capillary: 90 mg/dL (ref 70–99)
Glucose-Capillary: 126 mg/dL — ABNORMAL HIGH (ref 70–99)

## 2012-06-05 MED ORDER — METOPROLOL SUCCINATE ER 50 MG PO TB24: 75.0000 mg | ORAL_TABLET | Freq: Every day | ORAL | Status: DC

## 2012-06-05 MED ORDER — SODIUM CHLORIDE 0.9 % IJ SOLN: 3.0000 mL | INTRAMUSCULAR | Status: DC | PRN

## 2012-06-05 MED ORDER — OXYCODONE-ACETAMINOPHEN 5-325 MG PO TABS
1.0000 | ORAL_TABLET | ORAL | Status: DC | PRN
  Administered 2012-06-05 – 2012-06-06 (×5): 1 via ORAL
  Filled 2012-06-05 (×5): qty 1

## 2012-06-05 MED ORDER — FLUTICASONE PROPIONATE 50 MCG/ACT NA SUSP
2.0000 | Freq: Every day | NASAL | Status: DC | PRN
  Filled 2012-06-05: qty 16

## 2012-06-05 MED ORDER — AMLODIPINE BESYLATE 5 MG PO TABS
10.0000 mg | ORAL_TABLET | Freq: Every day | ORAL | Status: DC
  Administered 2012-06-05 – 2012-06-06 (×2): 10 mg via ORAL
  Filled 2012-06-05 (×2): qty 2

## 2012-06-05 MED ORDER — NITROGLYCERIN IN D5W 200-5 MCG/ML-% IV SOLN
5.0000 ug/min | INTRAVENOUS | Status: DC
  Administered 2012-06-05: 30 ug/min via INTRAVENOUS
  Filled 2012-06-05: qty 250

## 2012-06-05 MED ORDER — ENOXAPARIN SODIUM 40 MG/0.4ML ~~LOC~~ SOLN
40.0000 mg | SUBCUTANEOUS | Status: DC
  Administered 2012-06-05 – 2012-06-06 (×2): 40 mg via SUBCUTANEOUS
  Filled 2012-06-05 (×2): qty 0.4

## 2012-06-05 MED ORDER — ASPIRIN EC 81 MG PO TBEC
81.0000 mg | DELAYED_RELEASE_TABLET | Freq: Every day | ORAL | Status: DC
  Administered 2012-06-05 – 2012-06-06 (×2): 81 mg via ORAL
  Filled 2012-06-05 (×2): qty 1

## 2012-06-05 MED ORDER — OXYCODONE HCL 5 MG PO TABS
5.0000 mg | ORAL_TABLET | ORAL | Status: DC | PRN
  Administered 2012-06-05 – 2012-06-06 (×5): 5 mg via ORAL
  Filled 2012-06-05 (×5): qty 1

## 2012-06-05 MED ORDER — ALPRAZOLAM 1 MG PO TABS
1.0000 mg | ORAL_TABLET | Freq: Four times a day (QID) | ORAL | Status: DC | PRN
  Administered 2012-06-05 – 2012-06-06 (×4): 1 mg via ORAL
  Filled 2012-06-05 (×4): qty 1

## 2012-06-05 MED ORDER — OXYCODONE-ACETAMINOPHEN 10-325 MG PO TABS: 1.0000 | ORAL_TABLET | ORAL | Status: DC | PRN

## 2012-06-05 MED ORDER — METOPROLOL SUCCINATE ER 25 MG PO TB24
ORAL_TABLET | ORAL | Status: AC
  Filled 2012-06-05: qty 1

## 2012-06-05 MED ORDER — METOPROLOL SUCCINATE ER 25 MG PO TB24
75.0000 mg | ORAL_TABLET | Freq: Every day | ORAL | Status: DC
  Administered 2012-06-05 – 2012-06-06 (×2): 75 mg via ORAL
  Filled 2012-06-05 (×3): qty 3

## 2012-06-05 MED ORDER — INSULIN ASPART 100 UNIT/ML ~~LOC~~ SOLN
0.0000 [IU] | Freq: Three times a day (TID) | SUBCUTANEOUS | Status: DC
  Administered 2012-06-05: 5 [IU] via SUBCUTANEOUS
  Administered 2012-06-06 (×2): 1 [IU] via SUBCUTANEOUS

## 2012-06-05 MED ORDER — FUROSEMIDE 10 MG/ML IJ SOLN
40.0000 mg | Freq: Every day | INTRAMUSCULAR | Status: DC
  Administered 2012-06-05: 40 mg via INTRAVENOUS
  Filled 2012-06-05: qty 4

## 2012-06-05 MED ORDER — ALBUTEROL SULFATE (5 MG/ML) 0.5% IN NEBU: 2.5000 mg | INHALATION_SOLUTION | RESPIRATORY_TRACT | Status: AC | PRN

## 2012-06-05 MED ORDER — METOPROLOL SUCCINATE ER 50 MG PO TB24
ORAL_TABLET | ORAL | Status: AC
  Filled 2012-06-05: qty 1

## 2012-06-05 MED ORDER — NIACIN ER (ANTIHYPERLIPIDEMIC) 500 MG PO TBCR
500.0000 mg | EXTENDED_RELEASE_TABLET | Freq: Every day | ORAL | Status: DC
  Filled 2012-06-05 (×4): qty 1

## 2012-06-05 MED ORDER — ATORVASTATIN CALCIUM 40 MG PO TABS
40.0000 mg | ORAL_TABLET | Freq: Every day | ORAL | Status: DC
  Administered 2012-06-05: 40 mg via ORAL
  Filled 2012-06-05: qty 1

## 2012-06-05 MED ORDER — SODIUM CHLORIDE 0.9 % IV SOLN
250.0000 mL | INTRAVENOUS | Status: DC | PRN
  Administered 2012-06-05: 250 mL via INTRAVENOUS

## 2012-06-05 MED ORDER — FUROSEMIDE 10 MG/ML IJ SOLN
40.0000 mg | Freq: Once | INTRAMUSCULAR | Status: AC
  Administered 2012-06-05: 40 mg via INTRAVENOUS
  Filled 2012-06-05: qty 4

## 2012-06-05 MED ORDER — SODIUM CHLORIDE 0.9 % IJ SOLN
3.0000 mL | Freq: Two times a day (BID) | INTRAMUSCULAR | Status: DC
  Administered 2012-06-05 – 2012-06-06 (×2): 3 mL via INTRAVENOUS

## 2012-06-05 NOTE — Progress Notes (Signed)
.  Nutrition Brief Note  Patient identified on the Malnutrition Screening Tool (MST) Report  He was discharged on 6/4 from [facility]. Full nutrition assessment completed by RD on 06/03/12.  Body mass index is 22.56 kg/(m^2). Patient meets criteria for normal range based on current BMI.   Current diet order is Heart Healthy, patient is consuming approximately 100% of breakfast this morning. Labs and medications reviewed.   No nutrition interventions warranted at this time. If nutrition issues arise, please consult RD.   Royann Shivers MS,RD,LDN Office: [phone] Pager: [phone]

## 2012-06-05 NOTE — Consult Note (Signed)
CARDIOLOGY CONSULT NOTE  Patient ID: [name] MRN: [id number] DOB/AGE: 09/16/1945 67 y.o.  Admit date: 06/05/2012 Referring Physician: Rito Ehrlich MD Primary Philis Kendall, NP Primary Cardiologist: Hochrein MD Reason for Consultation: Atrial flutter, and CHF Active Problems:   Coronary artery disease   Hyperlipidemia   Hypertension   Atypical atrial flutter   Pulmonary edema   Diabetes mellitus  HPI: [name] is a 67 y/o male patient with multiple medical issues, recently discharged from [facility] (1 day ago) where he was followed by Dr. [place] for atrial flutter, aortic valve stenosis, and history of CABG X4 in 2004 and stenting to RCA in 2006. He was not found to be a candidate for anticoagulation due to frequent falls. Medications were adjusted to discontinue lasix as he was thought to be dehydrated with elevated creatinine of 1.51.  He remained on BB with increased dose (metoprolol 50 mg daily) and converted to NSR. ACE inhibitor was discontinued. Echo demonstrated EF of 55% with mild AoV stenosis. On discharge his creatinine was back to normal    Today, he had an acute onset of dyspnea and chest tightness, which awakened him around 2:30 am. He had eaten a fast food chicken sandwich when he got home from the hospital.. CXR described worsening pulmonary edema, though pneumonia could have a similar appearance. Pro-BNP 4068, Creainine 1.10, Na 137. Troponin negative. EKG NSR with RBBB. He was placed on lasix IV , diuresed and has begun to feel better. Wt on admission 164 lbs, wt on discharge was recorded at 154 lbs. He states that he is urinating a lot from the IV lasix and now wants to go home.   Echo last week commented on Aov valve very calcified, stating that "it does not open much, yet gradients are low at 10mg  (s) Peak gradient 18 mmHg. There was mild dialtation of the aortic root."  Review of systems complete and found to be negative unless listed above   Past  Medical History  Diagnosis Date  . Coronary artery disease     a. CABG in 2004 - LIMA to LAD, SVG to diag, SVG to OM, SVG to PDA. EF was 40%. b. Cath 2006 s/p DES to prox RCA.  . Diabetes mellitus   . Hyperlipidemia   . Hypertension   . Atypical atrial flutter     a. Dx 06/2012. b. Not a candidate for anticoag due to fall, tenuous social situation.  . Ischemic cardiomyopathy     a. EF 40% in 2004. b. EF 55% in 06/2012.  Marland Kitchen Aortic stenosis     a. By echo 06/2012, very calcified but mean gradient - will need clinical f/u and serial echoes.  . Low TSH level     a. 06/2012, f/u pcp.  Marland Kitchen Poor social situation   . Cerebral hemorrhage     a. At time of CABG - had post-op encephalopathy and small frontal hemorrhage in 2004.    Family History  Problem Relation Age of Onset  . CAD Father 35    History   Social History  . Marital Status: Divorced    Spouse Name: N/A    Number of Children: 2  . Years of Education: N/A   Occupational History  . Not on file.   Social History Main Topics  . Smoking status: Former Games developer  . Smokeless tobacco: Not on file  . Alcohol Use: No     Comment: Pt states he has not drank in 2 years  .  Drug Use: No  . Sexually Active:    Other Topics Concern  . Not on file   Social History Narrative   Lives with two friends.      Past Surgical History  Procedure Laterality Date  . Coronary artery bypass graft      2004     Prescriptions prior to admission  Medication Sig Dispense Refill  . ALPRAZolam (XANAX) 1 MG tablet Take 1 mg by mouth every 6 (six) hours as needed. For anxiety      . amLODipine (NORVASC) 10 MG tablet Take 1 tablet (10 mg total) by mouth daily.  30 tablet  6  . glipiZIDE (GLUCOTROL) 5 MG tablet Take 5 mg by mouth daily.        . metoprolol succinate (TOPROL-XL) 50 MG 24 hr tablet Take 1 and a half tablets (75mg ) by mouth daily.      . niacin (NIASPAN) 500 MG CR tablet Take 500 mg by mouth at bedtime.        Marland Kitchen  oxyCODONE-acetaminophen (PERCOCET) 10-325 MG per tablet Take 1 tablet by mouth every 4 (four) hours as needed. For pain       . promethazine (PHENERGAN) 25 MG tablet Take 25 mg by mouth every 6 (six) hours as needed. Nausea/vomiting       . rosuvastatin (CRESTOR) 20 MG tablet Take 20 mg by mouth daily.        Echocardiogram: 06/03/2012 Nl LV size and EF; mild LVH; possible low gradient AS; mild LAE  Physical Exam: Blood pressure 136/89, pulse 98, temperature 97.5 F (36.4 C), temperature source Oral, resp. rate 22, height 6' (1.829 m), weight 166 lb 6.4 oz (75.479 kg), SpO2 96.00%. General: Well developed, well nourished, in no acute distress Head: Eyes PERRLA, No xanthomas.   Normal cephalic and atramatic  Lungs: Bibasilar crackles without wheezes. Occasional cough with deep inspirations Heart: HRRR, normal S1, A2 preserved, with 2/6 early to midsystolic ejection murmur best heard at the cardiac base. Pulses are 2+ & equal.            No carotid bruit. No JVD.  No abdominal bruits. No femoral bruits. Abdomen: Bowel sounds are positive, abdomen soft and non-tender without masses  Msk:  Back normal, normal gait. Normal strength and tone for age. Extremities: No clubbing, cyanosis or edema.  DP +1 Neuro: Alert and oriented X 3. Psych:  Good affect, responds appropriately  Labs: Lab Results  Component Value Date   WBC 9.1 06/05/2012   HGB 14.9 06/05/2012   HCT 43.8 06/05/2012   MCV 84.1 06/05/2012   PLT 170 06/05/2012    Recent Labs Lab 06/02/12 2240  06/05/12 0459  NA 140  < > 137  K 3.9  < > 3.9  CL 104  < > 102  CO2 27  < > 24  BUN 16  < > 15  CREATININE 1.46*  < > 1.10  CALCIUM 8.9  < > 9.4  PROT 6.5  --   --   BILITOT 0.4  --   --   ALKPHOS 102  --   --   ALT 10  --   --   AST 17  --   --   GLUCOSE 156*  < > 144*  < > = values in this interval not displayed. Lab Results  Component Value Date   TROPONINI <0.30 06/05/2012    BNP (last 3 results)  Recent Labs  06/02/12 2240  06/05/12 0459  PROBNP  5064.0* 4068.0*   Radiology: Dg Chest [facility]  06/05/2012    There is worsening bilateral airspace opacification, in a relatively central distribution, with vascular congestion.  Small bilateral pleural effusions are suspected.     EKG:NSR with RBBB.Rate of 76 bpm.   ASSESSMENT AND PLAN:   1. Acute Pulmonary Edema: Now diuresing with IV lasix 40 mg daily. He was taken off of lasix po during recent hospitalization in the setting of possible dehydration and renal insufficiency with normal EF. Now feeling better with diureses. Will probably need lasix on discharge for ongoing fluid management. Low salt diet recommended.   2. AoV stenosis: Abnormal echo demonstrating calcified Aortic Valve with low gradients and normal EF.    3. CAD: History of CABG in 2004 with LIMA to LAD, SVG- Diagonal, SVG to PDA. DES to Prox RCA in 2006.  Continues on ASA, and statin, BB. No longer on plavix, stopped during [facility] admission.  4. Atrial flutter: Found during recent admission to [facility] this week. Converted to NSR on metoprolol. Not a candidate for anticoagulation due to financial and social issues.   5. Diabetes:Probably not very compliant with diet. Admits to going to [employer] as soon as he got out of the hospital.  5. History of COPD  6. Social issues: Difficultly with medical compliance due to cost issues and transportation.   Bettey Mare. Lyman Bishop NP [facility] 06/05/2012, 3:11 PM  Cardiology Attending Patient interviewed and examined. Discussed with Joni Reining, NP.  Above note annotated and modified based upon my findings.  Although tentative admitting diagnosis was dehydration on 06/02/2012, patient received less than 1 L of IV fluids and diuresed a total of 3 L.  Initial presentation with abnormal BUN and creatinine may have represented renal hypoperfusion related to his arrhythmia more than dehydration.  Echocardiogram will be reviewed, but significant aortic  stenosis is highly unlikely with the gradients reported on the previous study.  Moreover, exam is not consistent with critical aortic valve disease. Weight was at the lowest level noted in recent years (154 pounds) on admission and yesterday morning, but repeat measurement is 12 pounds higher-error likely.  If chest x-ray has improved, patient can likely be discharged tomorrow  McLaughlin Bing, MD 06/05/2012, 5:06 PM

## 2012-06-05 NOTE — Progress Notes (Signed)
UR chart review completed.  

## 2012-06-05 NOTE — H&P (Addendum)
PCP:   DANIEL,JANICE, NP   Chief Complaint:  Shortness of breath  HPI: 67 year old male who was just discharged yesterday from [facility] by [facility] cardiology  For  recent diagnosis of atrial flutter, he was found not to be a candidate for anticoagulation due to falls. Patient was taking Lasix which was discontinued by cardiology, in fact patient was given IV fluids for acute kidney injury. As per patient he started feeling shortness of breath yesterday after he reached home, which was associated with some chest tightness. He denies fever, admits to coughing up clear phlegm. No nausea vomiting or diarrhea. Patient does have a history of aortic stenosis, CABG x4 in 2004.  Allergies:   Allergies  Allergen Reactions  . Benadryl (Diphenhydramine Hcl) Other (See Comments)    Hot Flashes "Like Niaspan"      Past Medical History  Diagnosis Date  . Coronary artery disease     a. CABG in 2004 - LIMA to LAD, SVG to diag, SVG to OM, SVG to PDA. EF was 40%. b. Cath 2006 s/p DES to prox RCA.  . Diabetes mellitus   . Hyperlipidemia   . Hypertension   . Atypical atrial flutter     a. Dx 06/2012. b. Not a candidate for anticoag due to fall, tenuous social situation.  . Ischemic cardiomyopathy     a. EF 40% in 2004. b. EF 55% in 06/2012.  Marland Kitchen Aortic stenosis     a. By echo 06/2012, very calcified but mean gradient - will need clinical f/u and serial echoes.  . Low TSH level     a. 06/2012, f/u pcp.  Marland Kitchen Poor social situation   . Cerebral hemorrhage     a. At time of CABG - had post-op encephalopathy and small frontal hemorrhage in 2004.    Past Surgical History  Procedure Laterality Date  . Coronary artery bypass graft      2004    Prior to Admission medications   Medication Sig Start Date End Date Taking? Authorizing Provider  ALPRAZolam Prudy Feeler) 1 MG tablet Take 1 mg by mouth every 6 (six) hours as needed. For anxiety    Historical Provider, MD  amLODipine (NORVASC) 10 MG  tablet Take 1 tablet (10 mg total) by mouth daily. 06/04/12   Dayna N Dunn, PA-C  aspirin EC 81 MG EC tablet Take 1 tablet (81 mg total) by mouth daily. 06/04/12   Dayna N Dunn, PA-C  fluticasone (FLONASE) 50 MCG/ACT nasal spray Place 2 sprays into the nose daily as needed. congestion    Historical Provider, MD  glipiZIDE (GLUCOTROL) 5 MG tablet Take 5 mg by mouth daily.      Historical Provider, MD  metoprolol succinate (TOPROL-XL) 50 MG 24 hr tablet Take 1 and a half tablets (75mg ) by mouth daily. 06/04/12   Dayna N Dunn, PA-C  niacin (NIASPAN) 500 MG CR tablet Take 500 mg by mouth at bedtime.      Historical Provider, MD  oxyCODONE-acetaminophen (PERCOCET) 10-325 MG per tablet Take 1 tablet by mouth every 4 (four) hours as needed. For pain     Historical Provider, MD  promethazine (PHENERGAN) 25 MG tablet Take 25 mg by mouth every 6 (six) hours as needed. Nausea/vomiting     Historical Provider, MD  rosuvastatin (CRESTOR) 20 MG tablet Take 20 mg by mouth daily.      Historical Provider, MD    Social History:  reports that he has quit smoking. He does not  have any smokeless tobacco history on file. He reports that he does not drink alcohol or use illicit drugs.  Family History  Problem Relation Age of Onset  . CAD Father 72    Review of Systems:  HEENT: Denies headache, blurred vision, runny nose, sore throat,  Neck: Denies thyroid problems,lymphadenopathy Chest : See history of present illness Heart : See history of present illness GI: Denies  nausea, vomiting, diarrhea, constipation GU: Denies dysuria, urgency, frequency of urination, hematuria Neuro: Denies stroke, seizures, syncope Psych: Denies depression, anxiety, hallucinations   Physical Exam: Blood pressure 123/79, pulse 58, temperature 97.9 F (36.6 C), temperature source Oral, resp. rate 25, height 6' (1.829 m), weight 69.854 kg (154 lb), SpO2 97.00%. Constitutional:   Patient is a well-developed and well-nourished male  in no  acute distress and cooperative with exam. Head: Normocephalic and atraumatic Mouth: Mucus membranes moist Eyes: PERRL, EOMI, conjunctivae normal Neck: Supple, No Thyromegaly Cardiovascular: RRR, S1 normal, S2 normal Pulmonary/Chest: Bilateral crackles Abdominal: Soft. Non-tender, non-distended, bowel sounds are normal, no masses, organomegaly, or guarding present.  Neurological: A&O x3, Strenght is normal and symmetric bilaterally, cranial nerve II-XII are grossly intact, no focal motor deficit, sensory intact to light touch bilaterally.  Extremities : No Cyanosis, Clubbing or Edema   Labs on Admission:  Results for orders placed during the hospital encounter of 06/05/12 (from the past 48 hour(s))  BASIC METABOLIC PANEL     Status: Abnormal   Collection Time    06/05/12  4:59 AM      Result Value Range   Sodium 137  135 - 145 mEq/L   Potassium 3.9  3.5 - 5.1 mEq/L   Chloride 102  96 - 112 mEq/L   CO2 24  19 - 32 mEq/L   Glucose, Bld 144 (*) 70 - 99 mg/dL   BUN 15  6 - 23 mg/dL   Creatinine, Ser 1.61  0.50 - 1.35 mg/dL   Calcium 9.4  8.4 - 09.6 mg/dL   GFR calc non Af Amer 67 (*) >90 mL/min   GFR calc Af Amer 78 (*) >90 mL/min   Comment:            The eGFR has been calculated     using the CKD EPI equation.     This calculation has not been     validated in all clinical     situations.     eGFR's persistently     <90 mL/min signify     possible Chronic Kidney Disease.  TROPONIN I     Status: None   Collection Time    06/05/12  4:59 AM      Result Value Range   Troponin I <0.30  <0.30 ng/mL   Comment:            Due to the release kinetics of cTnI,     a negative result within the first hours     of the onset of symptoms does not rule out     myocardial infarction with certainty.     If myocardial infarction is still suspected,     repeat the test at appropriate intervals.  CBC     Status: None   Collection Time    06/05/12  4:59 AM      Result Value Range   WBC  9.1  4.0 - 10.5 K/uL   RBC 5.21  4.22 - 5.81 MIL/uL   Hemoglobin 14.9  13.0 - 17.0 g/dL  HCT 43.8  39.0 - 52.0 %   MCV 84.1  78.0 - 100.0 fL   MCH 28.6  26.0 - 34.0 pg   MCHC 34.0  30.0 - 36.0 g/dL   RDW 16.1  09.6 - 04.5 %   Platelets 170  150 - 400 K/uL  PRO B NATRIURETIC PEPTIDE     Status: Abnormal   Collection Time    06/05/12  4:59 AM      Result Value Range   Pro B Natriuretic peptide (BNP) 4068.0 (*) 0 - 125 pg/mL    Radiological Exams on Admission: Dg Chest Port 1 View  06/05/2012   *RADIOLOGY REPORT*  Clinical Data: Shortness of breath.  PORTABLE CHEST - 1 VIEW  Comparison: Chest radiograph performed 06/02/2012  Findings: There is worsening bilateral airspace opacification, in a relatively central distribution.  This may reflect worsening pulmonary edema, though pneumonia could have a similar appearance. Small bilateral pleural effusions are suspected.  No pneumothorax is seen.  Underlying vascular congestion is noted.  The cardiomediastinal silhouette is borderline normal in size.  The patient is status post median sternotomy, with evidence of prior CABG.  Calcification is noted within the aortic arch.  No acute osseous abnormalities are seen.  Healed right-sided rib fractures are again noted.  IMPRESSION: Worsening bilateral airspace opacification, in a relatively central distribution.  Given underlying vascular congestion, this may reflect worsening pulmonary edema, though pneumonia could have a similar appearance.  Suspect small bilateral pleural effusions.   Original Report Authenticated By: Tonia Ghent, M.D.    Assessment/Plan Active Problems:   Coronary artery disease   Hyperlipidemia   Hypertension   Atypical atrial flutter   Pulmonary edema   Diabetes mellitus  Pulmonary edema Chest x-ray shows worsening pulmonary edema Patient's BMP is elevated to 4068 He has been given Lasix 40 mg IV x1 in the ED We'll continue to monitor the intake and output He will need  Lasix at the time of discharge Patient also complained of chest tightness so we'll follow 3 sets of cardiac enzymes  Atrial Flutter Patient is not a candidate for anticoagulation due to fall risk HR is controlled Continue Metoprolol.  Hypertension Patient was found to be in hypertensive urgency and was started on nitro drip by the ED physician. At this time blood pressure stabilized and underwent to wean off the nitro drip. Will give patient Toprol XL 75 mg first dose now. Also continue amlodipine 10 mg by mouth daily  Aortic stenosis Echo done on 06/03/2012 showed mean gradient of 10 mm hg, with a peak gradient of 80 mm hg. Plan was to clinical followup as outpatient per cardiology. Consider calling cardiology consult in a.m.  Diabetes mellitus We'll start sliding scale insulin  Coronary artery disease Patient has history of stent, status post CABG Plavix was held by cardiology and he was started on aspirin 81 mg by mouth daily We'll continue him on aspirin  Hyperlipidemia  Continue Crestor, Niaspan  DVT prophylaxis Lovenox  Time Spent on Admission: 55 min  CODE STATUS: Presumed full code  Discussion: Discussed with patient the family at bedside  [facility] Pager: [phone] 06/05/2012, 7:00 AM

## 2012-06-05 NOTE — Telephone Encounter (Signed)
[place] patient needs TCM f/u phone call

## 2012-06-05 NOTE — Progress Notes (Addendum)
Hypoglycemic Event  CBG: 53  Treatment: 8 oz orange juice  Symptoms: None  Follow-up CBG: Time:1645 CBG Result:90  Possible Reasons for Event: Unknown Comments/MD notified:N/A    [place]  Remember to initiate Hypoglycemia Order Set & complete

## 2012-06-05 NOTE — Telephone Encounter (Signed)
Noted pt to be contacted tomorrow 06-06-12 per protocol contact time, will call tomorrow

## 2012-06-05 NOTE — ED Provider Notes (Signed)
History     CSN: [id number]  Arrival date & time 06/05/12  0435   None     Chief Complaint  Patient presents with  . Shortness of Breath   HPI [name] is a 67 y.o. male with a recent hospitalization at [facility] under the care of [place] cardiology for recent diagnosis of atrial flutter - patient is not anticoagulated thought to be a fall risk. Patient does have a history of coronary artery disease he is status post CABG in 2004x4, also does have a stent to the proximal RCA. Also has history of diabetes, hyperlipidemia, hypertension, ischemic cardiomyopathy and COPD. Patient continues to smoke one to 2 cigarettes daily.  He says he was doing fine at home, he has not been taking his Lasix as directed and his medicines for blood pressure have been changed around somewhat. He says that 2 hours ago started having acute onset of shortness of breath, which is been constant, severe, associated with some chest tightness. The tightness is throughout his entire chest, there's been no radiation, no diaphoresis, no nausea or vomiting, no abdominal pain, no fevers or chills. He says he has a cough but nothing is changed with that, does not become productive. Besides the shortness of breath he says he feels okay.   Past Medical History  Diagnosis Date  . Coronary artery disease     a. CABG in 2004 - LIMA to LAD, SVG to diag, SVG to OM, SVG to PDA. EF was 40%. b. Cath 2006 s/p DES to prox RCA.  . Diabetes mellitus   . Hyperlipidemia   . Hypertension   . Atypical atrial flutter     a. Dx 06/2012. b. Not a candidate for anticoag due to fall, tenuous social situation.  . Ischemic cardiomyopathy     a. EF 40% in 2004. b. EF 55% in 06/2012.  Marland Kitchen Aortic stenosis     a. By echo 06/2012, very calcified but mean gradient - will need clinical f/u and serial echoes.  . Low TSH level     a. 06/2012, f/u pcp.  Marland Kitchen Poor social situation   . Cerebral hemorrhage     a. At time of CABG - had post-op  encephalopathy and small frontal hemorrhage in 2004.    Past Surgical History  Procedure Laterality Date  . Coronary artery bypass graft      2004    Family History  Problem Relation Age of Onset  . CAD Father 88    History  Substance Use Topics  . Smoking status: Former Games developer  . Smokeless tobacco: Not on file  . Alcohol Use: No     Comment: Pt states he has not drank in 2 years      Review of Systems At least 10pt or greater review of systems completed and are negative except where specified in the HPI.  Allergies  Benadryl  Home Medications   Current Outpatient Rx  Name  Route  Sig  Dispense  Refill  . ALPRAZolam (XANAX) 1 MG tablet   Oral   Take 1 mg by mouth every 6 (six) hours as needed. For anxiety         . amLODipine (NORVASC) 10 MG tablet   Oral   Take 1 tablet (10 mg total) by mouth daily.   30 tablet   6   . aspirin EC 81 MG EC tablet   Oral   Take 1 tablet (81 mg total) by mouth daily.         Marland Kitchen  fluticasone (FLONASE) 50 MCG/ACT nasal spray   Nasal   Place 2 sprays into the nose daily as needed. congestion         . glipiZIDE (GLUCOTROL) 5 MG tablet   Oral   Take 5 mg by mouth daily.           . metoprolol succinate (TOPROL-XL) 50 MG 24 hr tablet      Take 1 and a half tablets (75mg ) by mouth daily.         . niacin (NIASPAN) 500 MG CR tablet   Oral   Take 500 mg by mouth at bedtime.           Marland Kitchen oxyCODONE-acetaminophen (PERCOCET) 10-325 MG per tablet   Oral   Take 1 tablet by mouth every 4 (four) hours as needed. For pain          . promethazine (PHENERGAN) 25 MG tablet   Oral   Take 25 mg by mouth every 6 (six) hours as needed. Nausea/vomiting          . rosuvastatin (CRESTOR) 20 MG tablet   Oral   Take 20 mg by mouth daily.             SpO2 96%  Physical Exam  Nursing notes reviewed.  Electronic medical record reviewed. VITAL SIGNS:   Filed Vitals:   06/05/12 0449 06/05/12 0450  BP:  172/116  Pulse:   76  Temp:  97.9 F (36.6 C)  TempSrc:  Oral  Resp:  20  Height:  6' (1.829 m)  Weight:  154 lb (69.854 kg)  SpO2: 96% 89%   CONSTITUTIONAL: Awake, oriented, appears non-toxic HENT: Atraumatic, normocephalic, oral mucosa pink and moist, airway patent. Nares patent without drainage. External ears normal. EYES: Conjunctiva clear, EOMI, PERRLA NECK: Trachea midline, non-tender, supple CARDIOVASCULAR: Normal heart rate, Normal rhythm, 2/6 harsh systolic ejection murmur at the right upper sternal border, S3 PULMONARY/CHEST: Decreased breath sounds at the bases especially, rales to mid fields. Symmetrical breath sounds. Non-tender. ABDOMINAL: Non-distended, soft, non-tender - no rebound or guarding.  BS normal. NEUROLOGIC: Non-focal, moving all four extremities, no gross sensory or motor deficits. EXTREMITIES: No clubbing, cyanosis, or edema SKIN: Warm, Dry, No erythema, No rash  ED Course  Procedures (including critical care time)  Date: 06/05/2012  Rate: 76  Rhythm: normal sinus rhythm  QRS Axis: Left axis deviation  Intervals: normal  ST/T Wave abnormalities: normal  Conduction Disutrbances: Right bundle branch  Narrative Interpretation: unremarkable - no significant change when compared with prior EKG from 06/02/2012  Labs Reviewed  BASIC METABOLIC PANEL - Abnormal; Notable for the following:    Glucose, Bld 144 (*)    GFR calc non Af Amer 67 (*)    GFR calc Af Amer 78 (*)    All other components within normal limits  PRO B NATRIURETIC PEPTIDE - Abnormal; Notable for the following:    Pro B Natriuretic peptide (BNP) 4068.0 (*)    All other components within normal limits  TROPONIN I  CBC   Dg Chest Port 1 View  06/05/2012   *RADIOLOGY REPORT*  Clinical Data: Shortness of breath.  PORTABLE CHEST - 1 VIEW  Comparison: Chest radiograph performed 06/02/2012  Findings: There is worsening bilateral airspace opacification, in a relatively central distribution.  This may reflect  worsening pulmonary edema, though pneumonia could have a similar appearance. Small bilateral pleural effusions are suspected.  No pneumothorax is seen.  Underlying vascular congestion is noted.  The  cardiomediastinal silhouette is borderline normal in size.  The patient is status post median sternotomy, with evidence of prior CABG.  Calcification is noted within the aortic arch.  No acute osseous abnormalities are seen.  Healed right-sided rib fractures are again noted.  IMPRESSION: Worsening bilateral airspace opacification, in a relatively central distribution.  Given underlying vascular congestion, this may reflect worsening pulmonary edema, though pneumonia could have a similar appearance.  Suspect small bilateral pleural effusions.   Original Report Authenticated By: Tonia Ghent, M.D.     1. CHF exacerbation   2. Dyspnea   3. Hypertension   4. Pulmonary edema       MDM  Patient arrives with acute onset shortness of breath after multiple medication modifications, discontinuation of his furosemide secondary to acute kidney damage.  Presentation is consistent with pulmonary edema, patient is hypertensive to 172/116. He is unable to complete full sentences, his oxygen saturations have returned to 93-95% on 4 L by nasal cannula. Patient is not currently on oxygen at home. Treating the patient with IV nitroglycerin, nitroglycerin drip, at 30 mics per minute, patient's blood pressure decreased into the 130s and 140s systolic, breathing became easier, respirations into the low 20s. Chest x-ray shows pulmonary edema-I do not think this patient has pneumonia, patient's been afebrile, no increased productive cough, likewise this patient's presentation is consistent with an acute congestive heart failure exacerbation secondary to discontinuation of Lasix and alteration of hypertensive medications. I do not suspect pulmonary embolism. No history of pulmonary embolism in this patient. No hemoptysis. Wells  score of 0  Patient's BNP is elevated, creatinine looks much better than the prior studies, we'll give the patient a dose of IV Lasix to help diuresis.  Discussed with Dr. Sharl Ma for admission.       Jones Skene, MD 06/05/12 [phone]

## 2012-06-05 NOTE — ED Notes (Signed)
Requests medication for pain.

## 2012-06-05 NOTE — ED Notes (Signed)
Pt has copd and feels sob. Dr. Reuel Boom his pcp told him to quit taking his lasiks 3 days ago.

## 2012-06-05 NOTE — Progress Notes (Signed)
Followup note: Patient seen after arrival to floor. Admitted this morning. Patient feeling better after diuresing. Pressure starting to ease off. Breathing more comfortably. Have consulted cardiology for followup.

## 2012-06-05 NOTE — ED Notes (Signed)
Per hospitalist, may titrate to stop nitroglycerin drip.

## 2012-06-05 NOTE — ED Notes (Signed)
Patient recently admitted to [facility] and discharged yesterday.

## 2012-06-06 ENCOUNTER — Inpatient Hospital Stay (HOSPITAL_COMMUNITY): Payer: PRIVATE HEALTH INSURANCE

## 2012-06-06 DIAGNOSIS — I359 Nonrheumatic aortic valve disorder, unspecified: Secondary | ICD-10-CM

## 2012-06-06 LAB — CBC
MCH: 28.2 pg (ref 26.0–34.0)
MCHC: 33.2 g/dL (ref 30.0–36.0)
Platelets: 202 10*3/uL (ref 150–400)
RBC: 5.1 MIL/uL (ref 4.22–5.81)
RDW: 14.2 % (ref 11.5–15.5)

## 2012-06-06 LAB — COMPREHENSIVE METABOLIC PANEL
ALT: 9 U/L (ref 0–53)
AST: 15 U/L (ref 0–37)
Albumin: 3.4 g/dL — ABNORMAL LOW (ref 3.5–5.2)
Calcium: 9.1 mg/dL (ref 8.4–10.5)
Creatinine, Ser: 1.36 mg/dL — ABNORMAL HIGH (ref 0.50–1.35)
Sodium: 135 mEq/L (ref 135–145)
Total Protein: 7.1 g/dL (ref 6.0–8.3)

## 2012-06-06 LAB — GLUCOSE, CAPILLARY: Glucose-Capillary: 125 mg/dL — ABNORMAL HIGH (ref 70–99)

## 2012-06-06 MED ORDER — FUROSEMIDE 20 MG PO TABS
20.0000 mg | ORAL_TABLET | Freq: Every day | ORAL | Status: DC
  Administered 2012-06-06: 20 mg via ORAL
  Filled 2012-06-06: qty 1

## 2012-06-06 MED ORDER — FUROSEMIDE 20 MG PO TABS: 20.0000 mg | ORAL_TABLET | Freq: Every day | ORAL | Status: DC

## 2012-06-06 MED ORDER — ASPIRIN 81 MG PO TBEC: 81.0000 mg | DELAYED_RELEASE_TABLET | Freq: Every day | ORAL | Status: DC

## 2012-06-06 NOTE — Care Management Note (Signed)
    Page 1 of 1   06/06/2012     10:48:04 AM   CARE MANAGEMENT NOTE 06/06/2012  Patient:  [name], [name]   Account Number:  [id number]  Date Initiated:  06/06/2012  Documentation initiated by:  Sharrie Rothman  Subjective/Objective Assessment:   Pt admitted from home with pulmonary edema. Pt lives with friends that he rents a room from and they assist pt with his needs. Pt is independent with ADL's.     Action/Plan:   No CM needs noted. Pt discharging home today.   Anticipated DC Date:  06/06/2012   Anticipated DC Plan:  HOME/SELF CARE      DC Planning Services  CM consult      Choice offered to / List presented to:             Status of service:  Completed, signed off Medicare Important Message given?   (If response is "NO", the following Medicare IM given date fields will be blank) Date Medicare IM given:   Date Additional Medicare IM given:    Discharge Disposition:  HOME/SELF CARE  Per UR Regulation:    If discussed at Long Length of Stay Meetings, dates discussed:    Comments:  06/06/12 1045 Arlyss Queen, RN BSN CM

## 2012-06-06 NOTE — Telephone Encounter (Signed)
.  left message to have patient return my call.  

## 2012-06-06 NOTE — Progress Notes (Signed)
06/06/12 1508 Reviewed discharge instructions with patient. Given copy of instructions, medication list, education sheets, smoking cessation information, and f/u appointments. Prescriptions sent to [facility] per MD. Pt verbalized understanding of instructions, when to take medications, and when to f/u with MD. IV sites d/c'd per nurse tech, within normal limits. Notified CMT of telemetry d/c for discharge. No c/o shortness of breath or pain at time of discharge. Pt left floor in stable condition via w/c accompanied by nurse tech. Earnstine Regal, RN

## 2012-06-06 NOTE — Progress Notes (Signed)
SUBJECTIVE: Feeling better. Wants to go home.  LABS: Basic Metabolic Panel:  Recent Labs  44/03/47 0459 06/06/12 0512  NA 137 135  K 3.9 3.6  CL 102 95*  CO2 24 29  GLUCOSE 144* 106*  BUN 15 17  CREATININE 1.10 1.36*  CALCIUM 9.4 9.1   Liver Function Tests:  Recent Labs  06/06/12 0512  AST 15  ALT 9  ALKPHOS 101  BILITOT 0.4  PROT 7.1  ALBUMIN 3.4*   CBC:  Recent Labs  06/05/12 0459 06/06/12 0512  WBC 9.1 9.4  HGB 14.9 14.4  HCT 43.8 43.4  MCV 84.1 85.1  PLT 170 202   Cardiac Enzymes:  Recent Labs  06/05/12 0459  TROPONINI <0.30   RADIOLOGY: Dg Chest Port 1 View  06/05/2012   *RADIOLOGY REPORT*  Clinical Data: Shortness of breath.  PORTABLE CHEST - 1 VIEW  Comparison: MPRESSION: Worsening bilateral airspace opacification, in a relatively central distribution.  Given underlying vascular congestion, this may reflect worsening pulmonary edema, though pneumonia could have a similar appearance.  Suspect small bilateral pleural effusions.   Original Report Authenticated By: Tonia Ghent, M.D.   PHYSICAL EXAM BP 125/77  Pulse 54  Temp(Src) 98.1 F (36.7 C) (Oral)  Resp 22  Ht 6' (1.829 m)  Wt 164 lb (74.39 kg)  BMI 22.24 kg/m2  SpO2 92% Wt loss 2lbs General: Well developed, well nourished, in no acute distress Head: Eyes PERRLA, No xanthomas.   Normal cephalic and atramatic  Lungs: Coarse inspiratory sounds, with mild crackles in the left base. No wheezes. Coughing helps to clear. Heart: HRRR S1 S2, 2/6 systolic murmur .  Pulses are 2+ & equal.            No carotid bruit. No JVD.  No abdominal bruits. No femoral bruits. Abdomen: Bowel sounds are positive, abdomen soft and non-tender without masses or                  Hernia's noted. Msk:  Back normal, . Normal strength and tone for age. Extremities: No clubbing, cyanosis or edema.  DP +1 Neuro: Alert and oriented X 3. Psych:  Good affect, responds appropriately  TELEMETRY: Reviewed telemetry pt  in: NSR Total I&O: -1 L Initial 2 weights performed 4 hours apart showed discrepancy of 6 kg with a second higher than the first despite some interval diuresis.  He remains 10+ pounds above his apparent baseline of 155-160 pounds  ASSESSMENT AND PLAN:  1. Acute Pulmonary Edema; Clinically well compensated.  Wt currently 164 lbs after two doses of IV lasix. Will begin lasix 20 mg po daily. Creatinine is slightly up from admission (1.10-1.3) after IV diureses. Follow up BMET in am if he is here, or can be done as OP. He has appt scheduled in our office next week.  2. Aortic Valve disease: Dr.Birt Reinoso is reviewing the echo for a second opinion concerning disease. Normal LV fx with normal gradients on first read during recent hospitalization this week. BP normal.  3. Hypertension: Currently well controlled. Metoprolol and amlodipine regimen will continue. Consider adding ACE in the setting of diabetes if creatinine improves.  4.COPD: Ongoing management as OP. Is followed by [facility]  5.Chronic Anxiety: Wants to have xanax and oxycodone refilled before he is discharged. Cardiology is not in a position to manage these medications. He is to follow with PCP.  Bettey Mare. Lyman Bishop NP [facility] 06/06/2012, 8:38 AM  Cardiology Attending Patient interviewed and examined.  Discussed with Joni Reining, NP.  Above note annotated and modified based upon my findings.  Worsening of chest x-ray is of concern, but symptomatically, patient is back to baseline.  His aortic stenosis is not hemodynamically important.  With continuing oral diuretic at home, he should reach his previous baseline. We concur with plans for discharge, but he will need close outpatient followup, home weights and advised to call if he gains more than 3 pounds.  Sparks Bing, MD 06/06/2012, 9:37 AM

## 2012-06-06 NOTE — Discharge Summary (Signed)
Physician Discharge Summary  [name] GEX:[id number] DOB: [birth year] DOA: 06/05/2012  PCP: Mitzi Hansen, NP  Admit date: 06/05/2012 Discharge date: 06/06/2012  Time spent: 25 minutes  Recommendations for Outpatient Follow-up:  1. Patient being restarted on Lasix. He will have a followup BMET at [facility] cardiology at his appointment next week.  Discharge Diagnoses:  Active Problems:   Coronary artery disease   Hyperlipidemia   Hypertension   Atypical atrial flutter   Pulmonary edema   Diabetes mellitus   Aortic stenosis   Discharge Condition: Improved, being discharged home  Diet recommendation: Low sodium heart healthy  Filed Weights   06/05/12 0450 06/05/12 0830 06/06/12 0700  Weight: 69.854 kg (154 lb) 75.479 kg (166 lb 6.4 oz) 74.39 kg (164 lb)    History of present illness:  15/30:67 year old male who was just discharged yesterday from [facility] by [facility] For recent diagnosis of atrial flutter, he was found not to be a candidate for anticoagulation due to falls. Patient was taking Lasix which was discontinued by cardiology, in fact patient was given IV fluids for acute kidney injury. As per patient he started feeling shortness of breath yesterday after he reached home, which was associated with some chest tightness. He denies fever, admits to coughing up clear phlegm. No nausea vomiting or diarrhea. Patient does have a history of aortic stenosis, CABG x4 in 2004.   Hospital Course:  Active Problems:   Coronary artery disease: Stable. Plavix discontinued on previous hospitalization.   Hyperlipidemia   Hypertension: Stable. With diuresis, pressures improved. Continue home meds.    Atypical atrial flutter converted to normal sinus rhythm on metoprolol. Not candidate for anticoagulation due to financial social issues.    Pulmonary edema: Patient received IV Lasix initially in the emergency room he started on Lasix. By mouth. By hospital day 2, he  diuresed 1.2 L. Some much more comfortable and able to ambulate comfortably on room air. Cardiology was consulted who after evaluation, recommended that patient resume low dose Lasix at 20 mg by mouth daily. Creatinine slightly bumped up on day of discharge 1.36. They recommended followup BMET stable check and to see him in the office next week.    Diabetes mellitus: Stable. CBG stable during this hospitalization.   Aortic stenosis: Heavily calcified aortic valve noted.   Procedures:  None  Consultations:  [place] [facility]  Discharge Exam: Filed Vitals:   06/06/12 0354 06/06/12 0700 06/06/12 0847 06/06/12 1421  BP: 125/77  129/77 157/81  Pulse: 54  66 64  Temp: 98.1 F (36.7 C)   96.9 F (36.1 C)  TempSrc: Oral   Axillary  Resp: 22  18 19   Height:      Weight:  74.39 kg (164 lb)    SpO2: 92%  93% 95%    General: Alert x3, no acute distress Cardiovascular: Regular rate and rhythm, S1-S2, soft 2/6 systolic ejection murmur Respiratory: Clear to auscultation bilaterally  Discharge Instructions  Discharge Orders   Future Appointments Provider Department Dept Phone   06/23/2012 2:20 PM Jodelle Gross, NP [facility] [place] [phone]   Future Orders Complete By Expires     Diet - low sodium heart healthy  As directed     Increase activity slowly  As directed         Medication List    TAKE these medications       ALPRAZolam 1 MG tablet  Commonly known as:  XANAX  Take 1 mg by mouth every  6 (six) hours as needed. For anxiety     amLODipine 10 MG tablet  Commonly known as:  NORVASC  Take 1 tablet (10 mg total) by mouth daily.     aspirin 81 MG EC tablet  Take 1 tablet (81 mg total) by mouth daily.     furosemide 20 MG tablet  Commonly known as:  LASIX  Take 1 tablet (20 mg total) by mouth daily.     glipiZIDE 5 MG tablet  Commonly known as:  GLUCOTROL  Take 5 mg by mouth daily.     metoprolol succinate 50 MG 24 hr tablet  Commonly known  as:  TOPROL-XL  Take 1 and a half tablets (75mg ) by mouth daily.     niacin 500 MG CR tablet  Commonly known as:  NIASPAN  Take 500 mg by mouth at bedtime.     oxyCODONE-acetaminophen 10-325 MG per tablet  Commonly known as:  PERCOCET  Take 1 tablet by mouth every 4 (four) hours as needed. For pain     promethazine 25 MG tablet  Commonly known as:  PHENERGAN  Take 25 mg by mouth every 6 (six) hours as needed. Nausea/vomiting     rosuvastatin 20 MG tablet  Commonly known as:  CRESTOR  Take 20 mg by mouth daily.       Allergies  Allergen Reactions  . Benadryl (Diphenhydramine Hcl) Other (See Comments)    Hot Flashes "Like Niaspan"       Follow-up Information   Follow up with DANIEL,JANICE, NP In 1 month. (Patient has a follow up appointment with Mitzi Hansen, Tuesday, July 08, 2012 at 10:30 a,m.)    Contact information:   [address]. [place] [address] [phone]       Follow up with [facility] as scheduled.. (Next week.)        The results of significant diagnostics from this hospitalization (including imaging, microbiology, ancillary and laboratory) are listed below for reference.    Significant Diagnostic Studies: Ct Head Wo Contrast  06/02/2012    IMPRESSION:  1.  No acute finding. 2.  Atrophy and chronic microvascular ischemic change   Ct Cervical Spine Wo Contrast  06/02/2012    IMPRESSION: Negative exam.   Original Report Authenticated By: Holley Dexter, M.D.   Dg Chest Port 1 View  06/05/2012    IMPRESSION: Worsening bilateral airspace opacification, in a relatively central distribution.  Given underlying vascular congestion, this may reflect worsening pulmonary edema, though pneumonia could have a similar appearance.  Suspect small bilateral pleural effusions.   Original Report Authenticated By: Tonia Ghent, M.D.   Dg Chest Port 1 View  06/02/2012    IMPRESSION: Bibasilar atelectasis.  No acute abnormality.   Original Report Authenticated  By: Holley Dexter, M.D.   Dg Shoulder Left  06/02/2012   IMPRESSION: No acute abnormality.   Original Report Authenticated By: Holley Dexter, M.D.    Microbiology: Recent Results (from the past 240 hour(s))  MRSA PCR SCREENING     Status: None   Collection Time    06/02/12  6:49 PM      Result Value Range Status   MRSA by PCR NEGATIVE  NEGATIVE Final   Comment:            The GeneXpert MRSA Assay (FDA     approved for NASAL specimens     only), is one component of a     comprehensive MRSA colonization     surveillance program. It is  not     intended to diagnose MRSA     infection nor to guide or     monitor treatment for     MRSA infections.     Labs: Basic Metabolic Panel:  Recent Labs Lab 06/02/12 1003 06/02/12 2240 06/03/12 0340 06/04/12 0345 06/05/12 0459 06/06/12 0512  NA 139 140 141 140 137 135  K 3.3* 3.9 3.6 3.4* 3.9 3.6  CL 100 104 107 107 102 95*  CO2 24 27 23 23 24 29   GLUCOSE 89 156* 37* 81 144* 106*  BUN 18 16 13 10 15 17   CREATININE 1.51* 1.46* 1.29 1.16 1.10 1.36*  CALCIUM 9.5 8.9 8.8 9.1 9.4 9.1  MG  --  2.2  --   --   --   --    Liver Function Tests:  Recent Labs Lab 06/02/12 2240 06/06/12 0512  AST 17 15  ALT 10 9  ALKPHOS 102 101  BILITOT 0.4 0.4  PROT 6.5 7.1  ALBUMIN 3.3* 3.4*   CBC:  Recent Labs Lab 06/02/12 1003 06/02/12 2240 06/05/12 0459 06/06/12 0512  WBC 8.9 8.6 9.1 9.4  NEUTROABS 7.4  --   --   --   HGB 13.6 13.6 14.9 14.4  HCT 39.4 40.3 43.8 43.4  MCV 81.7 84.0 84.1 85.1  PLT 180 174 170 202   Cardiac Enzymes:  Recent Labs Lab 06/02/12 1003 06/05/12 0459  TROPONINI <0.30 <0.30   BNP: BNP (last 3 results)  Recent Labs  06/02/12 2240 06/05/12 0459  PROBNP 5064.0* 4068.0*   CBG:  Recent Labs Lab 06/05/12 1620 06/05/12 1656 06/05/12 2110 06/06/12 0744 06/06/12 1130  GLUCAP 53* 90 126* 125* 136*       Signed:  Demetric Dunnaway K  [facility] 06/06/2012, 3:05 PM

## 2012-06-09 NOTE — Telephone Encounter (Signed)
.  left message to have patient return my call. Noted pt apt with KL on 06-23-12 at 2:20pm

## 2012-06-17 ENCOUNTER — Encounter: Payer: Self-pay | Admitting: *Deleted

## 2012-06-17 NOTE — Telephone Encounter (Signed)
Several attempts have been made to contact pt with no resolve, sent letter to pt address noted in chart with results/instructions/prescriptions. Advised pt to call office if any questions or concerns per letter.

## 2012-06-17 NOTE — Telephone Encounter (Signed)
Called pt home number listed, no longer in service, called pt sister as EC and noted no longer in service, called Melinda Cobb and noted unavailable, vm box has not been set up yet.

## 2012-06-23 ENCOUNTER — Encounter: Payer: PRIVATE HEALTH INSURANCE | Admitting: Adult Health

## 2012-06-23 NOTE — Progress Notes (Signed)
HPI: Mr. Locker is a 67 year old patient of Dr. [place] Lions we are following for ongoing assessment and management of atrial flutter, CHF, history of CAD, CABG in 2004 (LIMA to LAD, SVG to diagonal, SVG to OM, SVG to PDA. Drug-eluting stent to RCA 2006) . Also a history of aortic valve stenosis. The patient was not found to be a candidate for anticoagulation due to frequent falls.   The patient was hospitalized in June of 2014 in the setting of dyspnea chest tightness and CHF. He was also found to have acute kidney injury and Lasix was discontinued. However, CHF became more prominent and Lasix was re\re administered. He was started on Lasix 20 mg by mouth daily. Most recent echo in June of 2000 14 demonstrated EF of 55% with mild aortic valve stenosis. Weight on discharge 164 pounds.  Allergies  Allergen Reactions  . Benadryl (Diphenhydramine Hcl) Other (See Comments)    Hot Flashes "Like Niaspan"    Current Outpatient Prescriptions  Medication Sig Dispense Refill  . ALPRAZolam (XANAX) 1 MG tablet Take 1 mg by mouth every 6 (six) hours as needed. For anxiety      . amLODipine (NORVASC) 10 MG tablet Take 1 tablet (10 mg total) by mouth daily.  30 tablet  6  . aspirin 81 MG EC tablet Take 1 tablet (81 mg total) by mouth daily.  30 tablet    . furosemide (LASIX) 20 MG tablet Take 1 tablet (20 mg total) by mouth daily.  30 tablet  0  . glipiZIDE (GLUCOTROL) 5 MG tablet Take 5 mg by mouth daily.        . metoprolol succinate (TOPROL-XL) 50 MG 24 hr tablet Take 1 and a half tablets (75mg ) by mouth daily.      . niacin (NIASPAN) 500 MG CR tablet Take 500 mg by mouth at bedtime.        Marland Kitchen oxyCODONE-acetaminophen (PERCOCET) 10-325 MG per tablet Take 1 tablet by mouth every 4 (four) hours as needed. For pain       . promethazine (PHENERGAN) 25 MG tablet Take 25 mg by mouth every 6 (six) hours as needed. Nausea/vomiting       . rosuvastatin (CRESTOR) 20 MG tablet Take 20 mg by mouth daily.         No  current facility-administered medications for this visit.    Past Medical History  Diagnosis Date  . Coronary artery disease     a. CABG in 2004 - LIMA to LAD, SVG to diag, SVG to OM, SVG to PDA. EF was 40%. b. Cath 2006 s/p DES to prox RCA.  . Diabetes mellitus   . Hyperlipidemia   . Hypertension   . Atypical atrial flutter     a. Dx 06/2012. b. Not a candidate for anticoag due to fall, tenuous social situation.  . Ischemic cardiomyopathy     a. EF 40% in 2004. b. EF 55% in 06/2012.  Marland Kitchen Aortic stenosis     a. By echo 06/2012, very calcified but mean gradient - will need clinical f/u and serial echoes.  . Low TSH level     a. 06/2012, f/u pcp.  Marland Kitchen Poor social situation   . Cerebral hemorrhage     a. At time of CABG - had post-op encephalopathy and small frontal hemorrhage in 2004.    Past Surgical History  Procedure Laterality Date  . Coronary artery bypass graft      2004    ROS: PHYSICAL  EXAM There were no vitals taken for this visit.  EKG:  ASSESSMENT AND PLAN

## 2012-11-01 ENCOUNTER — Encounter (HOSPITAL_COMMUNITY): Payer: Self-pay | Admitting: Emergency Medicine

## 2012-11-01 ENCOUNTER — Inpatient Hospital Stay (HOSPITAL_COMMUNITY)
Admission: EM | Admit: 2012-11-01 | Discharge: 2012-11-02 | DRG: 291 | Disposition: A | Discharge status: Home / Self Care | Payer: PRIVATE HEALTH INSURANCE | Attending: Family Medicine | Admitting: Family Medicine

## 2012-11-01 ENCOUNTER — Emergency Department (HOSPITAL_COMMUNITY): Payer: PRIVATE HEALTH INSURANCE

## 2012-11-01 DIAGNOSIS — I129 Hypertensive chronic kidney disease with stage 1 through stage 4 chronic kidney disease, or unspecified chronic kidney disease: Secondary | ICD-10-CM | POA: Diagnosis present

## 2012-11-01 DIAGNOSIS — J9600 Acute respiratory failure, unspecified whether with hypoxia or hypercapnia: Secondary | ICD-10-CM

## 2012-11-01 DIAGNOSIS — Z9861 Coronary angioplasty status: Secondary | ICD-10-CM

## 2012-11-01 DIAGNOSIS — N189 Chronic kidney disease, unspecified: Secondary | ICD-10-CM | POA: Diagnosis present

## 2012-11-01 DIAGNOSIS — J9601 Acute respiratory failure with hypoxia: Secondary | ICD-10-CM

## 2012-11-01 DIAGNOSIS — Z8249 Family history of ischemic heart disease and other diseases of the circulatory system: Secondary | ICD-10-CM

## 2012-11-01 DIAGNOSIS — I5031 Acute diastolic (congestive) heart failure: Principal | ICD-10-CM

## 2012-11-01 DIAGNOSIS — I2589 Other forms of chronic ischemic heart disease: Secondary | ICD-10-CM | POA: Diagnosis present

## 2012-11-01 DIAGNOSIS — R0609 Other forms of dyspnea: Secondary | ICD-10-CM

## 2012-11-01 DIAGNOSIS — I2510 Atherosclerotic heart disease of native coronary artery without angina pectoris: Secondary | ICD-10-CM | POA: Diagnosis present

## 2012-11-01 DIAGNOSIS — I5033 Acute on chronic diastolic (congestive) heart failure: Secondary | ICD-10-CM

## 2012-11-01 DIAGNOSIS — I509 Heart failure, unspecified: Secondary | ICD-10-CM

## 2012-11-01 DIAGNOSIS — Z23 Encounter for immunization: Secondary | ICD-10-CM

## 2012-11-01 DIAGNOSIS — I4892 Unspecified atrial flutter: Secondary | ICD-10-CM | POA: Diagnosis present

## 2012-11-01 DIAGNOSIS — I484 Atypical atrial flutter: Secondary | ICD-10-CM | POA: Diagnosis present

## 2012-11-01 DIAGNOSIS — Z951 Presence of aortocoronary bypass graft: Secondary | ICD-10-CM

## 2012-11-01 DIAGNOSIS — F17200 Nicotine dependence, unspecified, uncomplicated: Secondary | ICD-10-CM

## 2012-11-01 DIAGNOSIS — E785 Hyperlipidemia, unspecified: Secondary | ICD-10-CM | POA: Diagnosis present

## 2012-11-01 DIAGNOSIS — Z91199 Patient's noncompliance with other medical treatment and regimen due to unspecified reason: Secondary | ICD-10-CM

## 2012-11-01 DIAGNOSIS — J441 Chronic obstructive pulmonary disease with (acute) exacerbation: Secondary | ICD-10-CM

## 2012-11-01 DIAGNOSIS — R0689 Other abnormalities of breathing: Secondary | ICD-10-CM

## 2012-11-01 DIAGNOSIS — I359 Nonrheumatic aortic valve disorder, unspecified: Secondary | ICD-10-CM | POA: Diagnosis present

## 2012-11-01 DIAGNOSIS — E119 Type 2 diabetes mellitus without complications: Secondary | ICD-10-CM | POA: Diagnosis present

## 2012-11-01 DIAGNOSIS — I350 Nonrheumatic aortic (valve) stenosis: Secondary | ICD-10-CM | POA: Diagnosis present

## 2012-11-01 DIAGNOSIS — I10 Essential (primary) hypertension: Secondary | ICD-10-CM | POA: Diagnosis present

## 2012-11-01 DIAGNOSIS — Z9119 Patient's noncompliance with other medical treatment and regimen: Secondary | ICD-10-CM

## 2012-11-01 DIAGNOSIS — Z8673 Personal history of transient ischemic attack (TIA), and cerebral infarction without residual deficits: Secondary | ICD-10-CM

## 2012-11-01 DIAGNOSIS — Z720 Tobacco use: Secondary | ICD-10-CM | POA: Diagnosis present

## 2012-11-01 LAB — CBC WITH DIFFERENTIAL/PLATELET
Basophils Absolute: 0 10*3/uL (ref 0.0–0.1)
Eosinophils Absolute: 0.5 10*3/uL (ref 0.0–0.7)
Eosinophils Relative: 4 % (ref 0–5)
HCT: 41.8 % (ref 39.0–52.0)
Lymphocytes Relative: 12 % (ref 12–46)
Lymphs Abs: 1.7 10*3/uL (ref 0.7–4.0)
MCH: 27.8 pg (ref 26.0–34.0)
MCV: 85 fL (ref 78.0–100.0)
Monocytes Absolute: 1.1 10*3/uL — ABNORMAL HIGH (ref 0.1–1.0)
Platelets: 218 10*3/uL (ref 150–400)
RDW: 14.3 % (ref 11.5–15.5)
WBC: 14 10*3/uL — ABNORMAL HIGH (ref 4.0–10.5)

## 2012-11-01 LAB — BASIC METABOLIC PANEL
CO2: 31 mEq/L (ref 19–32)
Calcium: 9.3 mg/dL (ref 8.4–10.5)
Chloride: 98 mEq/L (ref 96–112)
Creatinine, Ser: 1.43 mg/dL — ABNORMAL HIGH (ref 0.50–1.35)
Glucose, Bld: 164 mg/dL — ABNORMAL HIGH (ref 70–99)

## 2012-11-01 LAB — MAGNESIUM: Magnesium: 1.9 mg/dL (ref 1.5–2.5)

## 2012-11-01 LAB — GLUCOSE, CAPILLARY
Glucose-Capillary: 154 mg/dL — ABNORMAL HIGH (ref 70–99)
Glucose-Capillary: 165 mg/dL — ABNORMAL HIGH (ref 70–99)
Glucose-Capillary: 102 mg/dL — ABNORMAL HIGH (ref 70–99)
Glucose-Capillary: 305 mg/dL — ABNORMAL HIGH (ref 70–99)

## 2012-11-01 LAB — TROPONIN I: Troponin I: <0.3 ng/mL (ref <0.30)

## 2012-11-01 MED ORDER — FUROSEMIDE 10 MG/ML IJ SOLN
40.0000 mg | Freq: Once | INTRAMUSCULAR | Status: AC
  Administered 2012-11-01: 40 mg via INTRAVENOUS
  Filled 2012-11-01: qty 4

## 2012-11-01 MED ORDER — INSULIN ASPART 100 UNIT/ML ~~LOC~~ SOLN
0.0000 [IU] | Freq: Every day | SUBCUTANEOUS | Status: DC
  Administered 2012-11-01: 23:00:00 via SUBCUTANEOUS

## 2012-11-01 MED ORDER — ALBUTEROL SULFATE (5 MG/ML) 0.5% IN NEBU
5.0000 mg | INHALATION_SOLUTION | Freq: Once | RESPIRATORY_TRACT | Status: AC
  Administered 2012-11-01: 5 mg via RESPIRATORY_TRACT
  Filled 2012-11-01: qty 1

## 2012-11-01 MED ORDER — IPRATROPIUM BROMIDE 0.02 % IN SOLN
0.5000 mg | Freq: Once | RESPIRATORY_TRACT | Status: AC
  Administered 2012-11-01: 0.5 mg via RESPIRATORY_TRACT
  Filled 2012-11-01: qty 2.5

## 2012-11-01 MED ORDER — METOPROLOL TARTRATE 25 MG PO TABS
25.0000 mg | ORAL_TABLET | Freq: Two times a day (BID) | ORAL | Status: DC
  Administered 2012-11-01: 25 mg via ORAL
  Filled 2012-11-01 (×2): qty 1

## 2012-11-01 MED ORDER — NICOTINE 7 MG/24HR TD PT24
7.0000 mg | MEDICATED_PATCH | Freq: Every day | TRANSDERMAL | Status: DC
  Administered 2012-11-01 – 2012-11-02 (×2): 7 mg via TRANSDERMAL
  Filled 2012-11-01 (×3): qty 1

## 2012-11-01 MED ORDER — SODIUM CHLORIDE 0.9 % IV SOLN: 250.0000 mL | INTRAVENOUS | Status: DC | PRN

## 2012-11-01 MED ORDER — FUROSEMIDE 10 MG/ML IJ SOLN
20.0000 mg | Freq: Two times a day (BID) | INTRAMUSCULAR | Status: DC
  Administered 2012-11-01 (×2): 20 mg via INTRAVENOUS
  Filled 2012-11-01 (×2): qty 2

## 2012-11-01 MED ORDER — LEVALBUTEROL HCL 1.25 MG/0.5ML IN NEBU: 1.2500 mg | INHALATION_SOLUTION | Freq: Four times a day (QID) | RESPIRATORY_TRACT | Status: DC | PRN

## 2012-11-01 MED ORDER — AMLODIPINE BESYLATE 5 MG PO TABS
10.0000 mg | ORAL_TABLET | Freq: Every day | ORAL | Status: DC
  Administered 2012-11-01: 10 mg via ORAL
  Filled 2012-11-01 (×2): qty 2

## 2012-11-01 MED ORDER — OXYCODONE HCL 5 MG PO TABS
5.0000 mg | ORAL_TABLET | ORAL | Status: DC | PRN
  Administered 2012-11-01 (×2): 5 mg via ORAL
  Filled 2012-11-01 (×2): qty 1

## 2012-11-01 MED ORDER — ALPRAZOLAM 1 MG PO TABS
1.0000 mg | ORAL_TABLET | Freq: Three times a day (TID) | ORAL | Status: DC | PRN
  Administered 2012-11-01 – 2012-11-02 (×3): 1 mg via ORAL
  Filled 2012-11-01 (×3): qty 1

## 2012-11-01 MED ORDER — INFLUENZA VAC SPLIT QUAD 0.5 ML IM SUSP
0.5000 mL | INTRAMUSCULAR | Status: AC
  Administered 2012-11-02: 0.5 mL via INTRAMUSCULAR
  Filled 2012-11-01: qty 0.5

## 2012-11-01 MED ORDER — ATORVASTATIN CALCIUM 40 MG PO TABS
40.0000 mg | ORAL_TABLET | Freq: Every day | ORAL | Status: DC
  Administered 2012-11-01: 40 mg via ORAL
  Filled 2012-11-01: qty 1

## 2012-11-01 MED ORDER — GLIPIZIDE 5 MG PO TABS
5.0000 mg | ORAL_TABLET | Freq: Every day | ORAL | Status: DC
Start: 2012-11-01 — End: 2012-11-01
  Administered 2012-11-01: 5 mg via ORAL
  Filled 2012-11-01: qty 1

## 2012-11-01 MED ORDER — ONDANSETRON HCL 4 MG/2ML IJ SOLN: 4.0000 mg | INTRAMUSCULAR | Status: DC | PRN

## 2012-11-01 MED ORDER — INSULIN ASPART 100 UNIT/ML ~~LOC~~ SOLN
0.0000 [IU] | Freq: Three times a day (TID) | SUBCUTANEOUS | Status: DC
  Administered 2012-11-01 – 2012-11-02 (×3): 2 [IU] via SUBCUTANEOUS

## 2012-11-01 MED ORDER — IPRATROPIUM BROMIDE 0.02 % IN SOLN: 0.5000 mg | Freq: Four times a day (QID) | RESPIRATORY_TRACT | Status: DC | PRN

## 2012-11-01 MED ORDER — METHYLPREDNISOLONE SODIUM SUCC 125 MG IJ SOLR
125.0000 mg | Freq: Once | INTRAMUSCULAR | Status: AC
  Administered 2012-11-01: 125 mg via INTRAVENOUS
  Filled 2012-11-01: qty 2

## 2012-11-01 MED ORDER — HEPARIN SODIUM (PORCINE) 5000 UNIT/ML IJ SOLN
5000.0000 [IU] | Freq: Three times a day (TID) | INTRAMUSCULAR | Status: DC
  Administered 2012-11-01 – 2012-11-02 (×4): 5000 [IU] via SUBCUTANEOUS
  Filled 2012-11-01 (×4): qty 1

## 2012-11-01 MED ORDER — SODIUM CHLORIDE 0.9 % IJ SOLN: 3.0000 mL | INTRAMUSCULAR | Status: DC | PRN

## 2012-11-01 MED ORDER — OXYCODONE HCL 5 MG PO TABS
10.0000 mg | ORAL_TABLET | ORAL | Status: DC | PRN
Start: 2012-11-01 — End: 2012-11-02
  Administered 2012-11-01 – 2012-11-02 (×5): 10 mg via ORAL
  Filled 2012-11-01 (×5): qty 2

## 2012-11-01 MED ORDER — FUROSEMIDE 10 MG/ML IJ SOLN: 20.0000 mg | Freq: Two times a day (BID) | INTRAMUSCULAR | Status: DC

## 2012-11-01 MED ORDER — SODIUM CHLORIDE 0.9 % IJ SOLN
3.0000 mL | Freq: Two times a day (BID) | INTRAMUSCULAR | Status: DC
  Administered 2012-11-01 – 2012-11-02 (×3): 3 mL via INTRAVENOUS

## 2012-11-01 MED ORDER — PREDNISONE 20 MG PO TABS
40.0000 mg | ORAL_TABLET | Freq: Every day | ORAL | Status: DC
  Administered 2012-11-01 – 2012-11-02 (×2): 40 mg via ORAL
  Filled 2012-11-01 (×2): qty 2

## 2012-11-01 MED ORDER — ASPIRIN EC 81 MG PO TBEC
81.0000 mg | DELAYED_RELEASE_TABLET | Freq: Every day | ORAL | Status: DC
  Administered 2012-11-01 – 2012-11-02 (×2): 81 mg via ORAL
  Filled 2012-11-01 (×2): qty 1

## 2012-11-01 MED ORDER — NIACIN ER (ANTIHYPERLIPIDEMIC) 500 MG PO TBCR
500.0000 mg | EXTENDED_RELEASE_TABLET | Freq: Every day | ORAL | Status: DC
  Administered 2012-11-01: 500 mg via ORAL
  Filled 2012-11-01 (×2): qty 1

## 2012-11-01 MED ORDER — POTASSIUM CHLORIDE CRYS ER 10 MEQ PO TBCR
10.0000 meq | EXTENDED_RELEASE_TABLET | Freq: Two times a day (BID) | ORAL | Status: DC
  Administered 2012-11-01 – 2012-11-02 (×3): 10 meq via ORAL
  Filled 2012-11-01 (×3): qty 1

## 2012-11-01 MED ORDER — ACETAMINOPHEN 325 MG PO TABS: 650.0000 mg | ORAL_TABLET | ORAL | Status: DC | PRN

## 2012-11-01 MED ORDER — NICOTINE POLACRILEX 2 MG MT GUM
2.0000 mg | CHEWING_GUM | OROMUCOSAL | Status: DC | PRN
  Filled 2012-11-01: qty 1

## 2012-11-01 NOTE — ED Provider Notes (Signed)
CSN: [id number]     Arrival date & time 11/01/12  0000 History   First MD Initiated Contact with Patient 11/01/12 0006     Chief Complaint  Patient presents with  . Shortness of Breath   (Consider location/radiation/quality/duration/timing/severity/associated sxs/prior Treatment) HPI .Marland Kitchen... level V caveat for urgent need for intervention .   Shortness of breath approximately one hour ago. No chest pain, nausea, diaphoresis. Patient has a complex past medical history including heart disease, cardiomyopathy, hypertension, diabetes, aortic stenosis.  Exertion makes symptoms. Severity is moderate to severe. He does not use oxygen at home   Past Medical History  Diagnosis Date  . Coronary artery disease     a. CABG in 2004 - LIMA to LAD, SVG to diag, SVG to OM, SVG to PDA. EF was 40%. b. Cath 2006 s/p DES to prox RCA.  . Diabetes mellitus   . Hyperlipidemia   . Hypertension   . Atypical atrial flutter     a. Dx 06/2012. b. Not a candidate for anticoag due to fall, tenuous social situation.  . Ischemic cardiomyopathy     a. EF 40% in 2004. b. EF 55% in 06/2012.  Marland Kitchen Aortic stenosis     a. By echo 06/2012, very calcified but mean gradient - will need clinical f/u and serial echoes.  . Low TSH level     a. 06/2012, f/u pcp.  Marland Kitchen Poor social situation   . Cerebral hemorrhage     a. At time of CABG - had post-op encephalopathy and small frontal hemorrhage in 2004.   Past Surgical History  Procedure Laterality Date  . Coronary artery bypass graft      2004   Family History  Problem Relation Age of Onset  . CAD Father 29   History  Substance Use Topics  . Smoking status: Former Games developer  . Smokeless tobacco: Not on file  . Alcohol Use: No     Comment: Pt states he has not drank in 2 years    Review of Systems  Unable to perform ROS: Acuity of condition    Allergies  Benadryl  Home Medications   Current Outpatient Rx  Name  Route  Sig  Dispense  Refill  . ALPRAZolam (XANAX) 1  MG tablet   Oral   Take 1 mg by mouth every 6 (six) hours as needed. For anxiety         . amLODipine (NORVASC) 10 MG tablet   Oral   Take 1 tablet (10 mg total) by mouth daily.   30 tablet   6   . aspirin 81 MG EC tablet   Oral   Take 1 tablet (81 mg total) by mouth daily.   30 tablet      . furosemide (LASIX) 20 MG tablet   Oral   Take 1 tablet (20 mg total) by mouth daily.   30 tablet   0   . glipiZIDE (GLUCOTROL) 5 MG tablet   Oral   Take 5 mg by mouth daily.           . metoprolol succinate (TOPROL-XL) 50 MG 24 hr tablet      Take 1 and a half tablets (75mg ) by mouth daily.         . niacin (NIASPAN) 500 MG CR tablet   Oral   Take 500 mg by mouth at bedtime.           Marland Kitchen oxyCODONE-acetaminophen (PERCOCET) 10-325 MG per tablet  Oral   Take 1 tablet by mouth every 4 (four) hours as needed. For pain          . promethazine (PHENERGAN) 25 MG tablet   Oral   Take 25 mg by mouth every 6 (six) hours as needed. Nausea/vomiting          . rosuvastatin (CRESTOR) 20 MG tablet   Oral   Take 20 mg by mouth daily.            BP 126/83  Pulse 92  Temp(Src) 99.1 F (37.3 C) (Oral)  Resp 17  Ht 6' (1.829 m)  Wt 164 lb (74.39 kg)  BMI 22.24 kg/m2  SpO2 96% Physical Exam  Nursing note and vitals reviewed. Constitutional: He is oriented to person, place, and time.  Pale, slightly dyspneic  HENT:  Head: Normocephalic and atraumatic.  Eyes: Conjunctivae and EOM are normal. Pupils are equal, round, and reactive to light.  Neck: Normal range of motion. Neck supple.  Cardiovascular: Normal rate, regular rhythm and normal heart sounds.   Pulmonary/Chest: Effort normal and breath sounds normal.  Not moving air well  Abdominal: Soft. Bowel sounds are normal.  Musculoskeletal: Normal range of motion.  Neurological: He is alert and oriented to person, place, and time.  Skin: Skin is warm and dry.  Psychiatric: He has a normal mood and affect.    ED  Course  Procedures (including critical care time) Labs Review Labs Reviewed  BASIC METABOLIC PANEL - Abnormal; Notable for the following:    Glucose, Bld 164 (*)    Creatinine, Ser 1.43 (*)    GFR calc non Af Amer 48 (*)    GFR calc Af Amer 56 (*)    All other components within normal limits  CBC WITH DIFFERENTIAL - Abnormal; Notable for the following:    WBC 14.0 (*)    Neutro Abs 10.7 (*)    Monocytes Absolute 1.1 (*)    All other components within normal limits  TROPONIN I   Imaging Review Dg Chest 1 View  11/01/2012   CLINICAL DATA:  Shortness of breath  EXAM: CHEST - 1 VIEW  COMPARISON:  06/05/2012  FINDINGS: Diffuse interstitial opacities with hazy bilateral airspace disease. Stable cardiomegaly. CABG changes. Possible small effusions. No pneumothorax.  IMPRESSION: Congestive heart failure.   Electronically Signed   By: Tiburcio Pea M.D.   On: 11/01/2012 01:29    EKG Interpretation     Ventricular Rate:  85 PR Interval:  170 QRS Duration: 132 QT Interval:  438 QTC Calculation: 521 R Axis:   -47 Text Interpretation:  Sinus rhythm with Premature atrial complexes Left axis deviation Right bundle branch block Abnormal ECG When compared with ECG of 05-Jun-2012 04:41, Premature atrial complexes are now Present T wave amplitude has decreased in Lateral leads            MDM   1. CHF (congestive heart failure)    History physical and chest x-ray suggests congestive heart failure. Patient does not need to be intubated. IV Lasix. Admit to general medicine    Donnetta Hutching, MD 11/01/12 [phone]

## 2012-11-01 NOTE — ED Notes (Signed)
Patient's oxygen saturation 87% on room air. Patient placed on 2 liters of oxygen via nasal canula. Oxygen saturation up to 93%. Dr. Adriana Simas in room assessing patient at this time.

## 2012-11-01 NOTE — H&P (Signed)
[facility] History and Physical  [name]  ZOX:[id number]  DOB: [birth year]   DOA: 11/01/2012   PCP:   Mitzi Hansen, NP   Chief Complaint:  Shortness of breath for one hour  HPI: [name] is a 67 y.o. male.   Elderly smoker with coronary artery disease, aortic stenosis, diastolic heart failure and chronic kidney, presents complaining of shortness of breath for one hour prior to presentation. He denies fever or chills. Continues to smoke 3-4 cigarettes per day. He has been having wheezing and cough associated with clear sputum, and usually uses his landlord's nebulizer, but they now have no medication for the nebulized.  He denies orthopnea, PND or leg edema.  In the emergency room patient was initially assessed as having a COPD exacerbation, and according to him he got prompt relief from inhaler treatments in the emergency room. However, subsequent chest x-ray suggested pulmonary edema and admission was requested for decompensated heart failure.  He admits to being noncompliant with Lasix  and diet  Currently he is requesting pain medication and antianxiety   Rewiew of Systems:   All systems negative except as marked bold or noted in the HPI;  Constitutional:    malaise, fever and chills. ;  Eyes:   eye pain, redness and discharge. ;  ENMT:   ear pain, hoarseness, nasal congestion, sinus pressure and sore throat. ;  Cardiovascular:    chest pain, palpitations, diaphoresis, dyspnea and peripheral edema.  Respiratory:   cough, hemoptysis, wheezing and stridor. ;  Gastrointestinal:  nausea, vomiting, diarrhea, constipation, abdominal pain, melena, blood in stool, hematemesis, jaundice and rectal bleeding. unusual weight loss..   Genitourinary:    frequency, dysuria, incontinence,flank pain and hematuria; Musculoskeletal:   back pain and neck pain.  swelling and trauma.;  Skin: .  pruritus, rash, abrasions, bruising and skin lesion.; ulcerations Neuro:     headache, lightheadedness and neck stiffness.  weakness, altered level of consciousness, altered mental status, extremity weakness, burning feet, involuntary movement, seizure and syncope.  Psych:    anxiety, depression, insomnia, tearfulness, panic attacks, hallucinations, paranoia, suicidal or homicidal ideation    Past Medical History  Diagnosis Date  . Coronary artery disease     a. CABG in 2004 - LIMA to LAD, SVG to diag, SVG to OM, SVG to PDA. EF was 40%. b. Cath 2006 s/p DES to prox RCA.  . Diabetes mellitus   . Hyperlipidemia   . Hypertension   . Atypical atrial flutter     a. Dx 06/2012. b. Not a candidate for anticoag due to fall, tenuous social situation.  . Ischemic cardiomyopathy     a. EF 40% in 2004. b. EF 55% in 06/2012.  Marland Kitchen Aortic stenosis     a. By echo 06/2012, very calcified but mean gradient - will need clinical f/u and serial echoes.  . Low TSH level     a. 06/2012, f/u pcp.  Marland Kitchen Poor social situation   . Cerebral hemorrhage     a. At time of CABG - had post-op encephalopathy and small frontal hemorrhage in 2004.  Marland Kitchen CHF (congestive heart failure)     Past Surgical History  Procedure Laterality Date  . Coronary artery bypass graft      2004    Medications:  HOME MEDS: Prior to Admission medications   Medication Sig Start Date End Date Taking? Authorizing Provider  ALPRAZolam Prudy Feeler) 1 MG tablet Take 1 mg by mouth every 6 (six) hours as needed.  For anxiety    Historical Provider, MD  amLODipine (NORVASC) 10 MG tablet Take 1 tablet (10 mg total) by mouth daily. 06/04/12   Dayna N Dunn, PA-C  aspirin 81 MG EC tablet Take 1 tablet (81 mg total) by mouth daily. 06/06/12   Hollice Espy, MD  furosemide (LASIX) 20 MG tablet Take 1 tablet (20 mg total) by mouth daily. 06/06/12   Hollice Espy, MD  glipiZIDE (GLUCOTROL) 5 MG tablet Take 5 mg by mouth daily.      Historical Provider, MD  metoprolol succinate (TOPROL-XL) 50 MG 24 hr tablet Take 1 and a half  tablets (75mg ) by mouth daily. 06/04/12   Dayna N Dunn, PA-C  niacin (NIASPAN) 500 MG CR tablet Take 500 mg by mouth at bedtime.      Historical Provider, MD  oxyCODONE-acetaminophen (PERCOCET) 10-325 MG per tablet Take 1 tablet by mouth every 4 (four) hours as needed. For pain     Historical Provider, MD  promethazine (PHENERGAN) 25 MG tablet Take 25 mg by mouth every 6 (six) hours as needed. Nausea/vomiting     Historical Provider, MD  rosuvastatin (CRESTOR) 20 MG tablet Take 20 mg by mouth daily.      Historical Provider, MD     Allergies:  Allergies  Allergen Reactions  . Benadryl [Diphenhydramine Hcl] Other (See Comments)    Hot Flashes "Like Niaspan"    Social History:   reports that he has quit smoking. He does not have any smokeless tobacco history on file. He reports that he does not drink alcohol or use illicit drugs.  Family History: Family History  Problem Relation Age of Onset  . CAD Father 56     Physical Exam: Filed Vitals:   11/01/12 0013 11/01/12 0045 11/01/12 0207 11/01/12 0300  BP: 155/98  126/83 130/91  Pulse: 96  92 80  Temp: 99.1 F (37.3 C)     TempSrc: Oral     Resp: 24  17 30   Height: 6' (1.829 m)     Weight: 74.39 kg (164 lb)     SpO2: 87% 93% 96% 95%   Blood pressure 130/91, pulse 80, temperature 99.1 F (37.3 C), temperature source Oral, resp. rate 30, height 6' (1.829 m), weight 74.39 kg (164 lb), SpO2 95.00%. Body mass index is 22.24 kg/(m^2).   GEN:  Pleasant elderly Caucasian gentleman reclining in bed; cooperative with exam PSYCH:  alert and oriented ;  anxious ; affect is appropriate. HEENT: Mucous membranes pink and anicteric; PERRLA; EOM intact; no cervical lymphadenopathy nor thyromegaly or carotid bruit; JVD 8 cm above the sternal angle; Breasts:: Not examined CHEST WALL: No tenderness CHEST: Tachypnea, clear to auscultation bilaterally HEART: Tachycardic no murmur heard ABDOMEN: soft non-tender; no masses, no organomegaly, normal  abdominal bowel sounds; no pannus; no intertriginous candida. Rectal Exam: Not done EXTREMITIES:; age-appropriate arthropathy of the hands and knees; no edema; no ulcerations. Genitalia: not examined PULSES: 2+ and symmetric SKIN: Normal hydration no rash or ulceration; unkempt CNS: Cranial nerves 2-12 grossly intact no focal lateralizing neurologic deficit   Labs on Admission:  Basic Metabolic Panel:  Recent Labs Lab 11/01/12 0056  NA 138  K 3.9  CL 98  CO2 31  GLUCOSE 164*  BUN 14  CREATININE 1.43*  CALCIUM 9.3   Liver Function Tests: No results found for this basename: AST, ALT, ALKPHOS, BILITOT, PROT, ALBUMIN,  in the last 168 hours No results found for this basename: LIPASE, AMYLASE,  in the  last 168 hours No results found for this basename: AMMONIA,  in the last 168 hours CBC:  Recent Labs Lab 11/01/12 0056  WBC 14.0*  NEUTROABS 10.7*  HGB 13.7  HCT 41.8  MCV 85.0  PLT 218   Cardiac Enzymes:  Recent Labs Lab 11/01/12 0056  TROPONINI <0.30   BNP: No components found with this basename: POCBNP,  D-dimer: No components found with this basename: D-DIMER,  CBG: No results found for this basename: GLUCAP,  in the last 168 hours  Radiological Exams on Admission: Dg Chest 1 View  11/01/2012   CLINICAL DATA:  Shortness of breath  EXAM: CHEST - 1 VIEW  COMPARISON:  06/05/2012  FINDINGS: Diffuse interstitial opacities with hazy bilateral airspace disease. Stable cardiomegaly. CABG changes. Possible small effusions. No pneumothorax.  IMPRESSION: Congestive heart failure.   Electronically Signed   By: Tiburcio Pea M.D.   On: 11/01/2012 01:29    EKG: Independently reviewed. Sinus rhythm; right bundle branch block   Assessment/Plan   Active Problems:   Coronary artery disease   Hyperlipidemia   Hypertension   Diabetes mellitus   Aortic stenosis   Difficulty breathing   Acute on chronic diastolic CHF (congestive heart failure)   Tobacco  abuse   PLAN: Respiratory support; including oxygen and nebs; We'll check B natruretic peptide, if elevated above baseline we'll initiate acute heart failure protocol; Will use low-dose Lasix and because her records indicate she has a propensity to get dehydrated very quickly;  will not give ACE inhibitors because of renal insufficiency;  because of his aortic stenosis Will continue beta blockers Sliding scale insulin for diabetes; continue glipizide   Other plans as per orders.  Code Status: Full code Family Communication:     Thula Stewart Nocturnist [facility] Pager [phone]   11/01/2012, 4:02 AM

## 2012-11-01 NOTE — Progress Notes (Signed)
[facility] PROGRESS NOTE  [name] ZOX:[id number] DOB: Jul 04, 1945 DOA: 11/01/2012 PCP: Mitzi Hansen, NP  Assessment/Plan: 1. Acute respiratory failure with hypoxia: Likely multifactorial including acute diastolic heart failure, suspect a component of COPD. 2. Acute diastolic congestive heart failure: Continue Lasix. Follow daily weights, urine output. Last echocardiogram 06/03/2012: LVEF 55%. No comment on diastolic function 3. COPD: Suspect component of acute exacerbation. Change to oral steroids. Continue nebulizer treatment. 4. Diabetes mellitus: Sliding scale insulin. Hold oral agents. 5. Atypical atrial flutter: Currently in sinus rhythm. Not a candidate for anticoagulation secondary to fall, social situation per chart review. 6. Coronary artery disease, CABG 2004, stent placement 2006, history of ischemic cardiomyopathy with recovery of LV function. 7. Aortic stenosis: Appears asymptomatic. Followup is no patient. 8. Cigarette smoker, tobacco dependence: Recommend cessation.   Continue diuretics, daily weights, follow urine output  Wean oxygen as tolerated  Change to oral steroids, continue nebulizers  Nicotine patch, nicotine gum  Pending studies:   None  Code Status: full DVT prophylaxis: heparin Family Communication: none present Disposition Plan: home when improved  Brendia Sacks, MD  [facility]  Pager [phone] If 7PM-7AM, please contact night-coverage at [email or web address], password [email or web address] 11/01/2012, 8:00 AM  LOS: 0 days   Summary: 67 year old man with ongoing cigarette use, history of coronary artery disease, CABG, stent placement, diastolic heart failure, aortic stenosis presented to the emergency department with sudden onset of shortness of breath. He rapidly improved with nebulizer therapy in the emergency department. Chest x-ray suggested decompensated heart failure and patient was admitted for further  treatment.  Consultants:    Procedures:    HPI/Subjective: Feels much better today. Breathing better. No chest pain.  Objective: Filed Vitals:   11/01/12 0207 11/01/12 0300 11/01/12 0415 11/01/12 0500  BP: 126/83 130/91 125/86   Pulse: 92 80 113   Temp:   97.7 F (36.5 C)   TempSrc:   Oral   Resp: 17 30 24    Height:   6' (1.829 m)   Weight:   78.5 kg (173 lb 1 oz) 78.5 kg (173 lb 1 oz)  SpO2: 96% 95% 100%     Intake/Output Summary (Last 24 hours) at 11/01/12 0800 Last data filed at 11/01/12 0646  Gross per 24 hour  Intake      0 ml  Output    950 ml  Net   -950 ml     Filed Weights   11/01/12 0013 11/01/12 0415 11/01/12 0500  Weight: 74.39 kg (164 lb) 78.5 kg (173 lb 1 oz) 78.5 kg (173 lb 1 oz)    Exam:   Afebrile, tachypneic in the 20s. Hypoxia stable on 5 L nasal cannula  General: Appears calm and comfortable. Nontoxic.  Psychiatric: Grossly normal mood and affect. Speech fluent and appropriate.  Cardiovascular: Regular rate and rhythm. No murmur, rub or gallop. No lower extremity edema.  Respiratory: Poor air movement, decreased breath sounds, some inspiratory crackles bilaterally posteriorly. Normal respiratory effort. Able to speak in full sentences.  Data Reviewed:  Chemistry panel notable for creatinine 1.43 with normal BUN.  BNP 7093  CBC notable for WBC 14 no left shift  EKG: Sinus rhythm, left axis deviation, right bundle branch block. Compared to previous study 06/05/2012, no acute changes seen.  Scheduled Meds: . amLODipine  10 mg Oral Daily  . aspirin EC  81 mg Oral Daily  . atorvastatin  40 mg Oral q1800  . furosemide  20 mg Intravenous Q12H  . glipiZIDE  5 mg  Oral Q breakfast  . heparin  5,000 Units Subcutaneous Q8H  . [START ON 11/02/2012] influenza vac split quadrivalent PF  0.5 mL Intramuscular Tomorrow-1000  . insulin aspart  0-5 Units Subcutaneous QHS  . insulin aspart  0-9 Units Subcutaneous TID WC  . metoprolol tartrate  25  mg Oral BID  . niacin  500 mg Oral QHS  . potassium chloride  10 mEq Oral BID  . sodium chloride  3 mL Intravenous Q12H   Continuous Infusions:   Principal Problem:   Acute respiratory failure with hypoxia Active Problems:   Coronary artery disease   Hypertension   Atypical atrial flutter   Diabetes mellitus   Aortic stenosis   Tobacco abuse   Acute diastolic congestive heart failure   COPD exacerbation   Tobacco dependence   Time spent 20 minutes

## 2012-11-01 NOTE — ED Notes (Signed)
Patient complaining of shortness of breath that started an hour ago. States history of chf.

## 2012-11-01 NOTE — Plan of Care (Signed)
Tele alerts come periodically over spec and state HR is jumping from 55 to 110 (within those ranges).  Each time pt appears to be asymptomatic and is showing no s/sx of increased distress.  Dr. Irene Limbo made aware.

## 2012-11-02 DIAGNOSIS — F17200 Nicotine dependence, unspecified, uncomplicated: Secondary | ICD-10-CM

## 2012-11-02 LAB — BASIC METABOLIC PANEL
CO2: 29 mEq/L (ref 19–32)
Calcium: 9.5 mg/dL (ref 8.4–10.5)
Chloride: 94 mEq/L — ABNORMAL LOW (ref 96–112)
Creatinine, Ser: 1.48 mg/dL — ABNORMAL HIGH (ref 0.50–1.35)
GFR calc Af Amer: 54 mL/min — ABNORMAL LOW (ref >90)
Potassium: 4.1 mEq/L (ref 3.5–5.1)
Sodium: 132 mEq/L — ABNORMAL LOW (ref 135–145)

## 2012-11-02 LAB — GLUCOSE, CAPILLARY: Glucose-Capillary: 111 mg/dL — ABNORMAL HIGH (ref 70–99)

## 2012-11-02 MED ORDER — NICOTINE 7 MG/24HR TD PT24: 1.0000 | MEDICATED_PATCH | Freq: Every day | TRANSDERMAL | Status: DC

## 2012-11-02 MED ORDER — ASPIRIN 81 MG PO TBEC: 81.0000 mg | DELAYED_RELEASE_TABLET | Freq: Every day | ORAL | Status: DC

## 2012-11-02 MED ORDER — NICOTINE POLACRILEX 2 MG MT GUM: 2.0000 mg | CHEWING_GUM | OROMUCOSAL | Status: DC | PRN

## 2012-11-02 NOTE — Progress Notes (Signed)
[facility] PROGRESS NOTE  BARRINGTON WORLEY ZOX:[id number] DOB: [birth year] DOA: 11/01/2012 PCP: Mitzi Hansen, NP  Assessment/Plan: 1. Acute respiratory failure with hypoxia: Resolved. Likely secondary to acute diastolic heart failure. COPD was considered, however patient is not requiring further nebulizers and responded rapidly to diuresis. 2. Acute diastolic (presumed) congestive heart failure:  Last echocardiogram 06/03/2012: LVEF 55%. No comment on diastolic function 3. COPD: Component of COPD exacerbation was considered, but given the rapid improvement and clinical findings this is now doubted. No further treatment at this time.  4. Diabetes mellitus: Well controlled during this hospitalization with minimal sliding scale insulin even while on steroids. Given heart failure will discontinue metformin. Followup as sugars closely with PCP next week in diet. Last hemoglobin A1c in June 5 5.5. 5. Atypical atrial flutter: Currently in sinus rhythm. Not a candidate for anticoagulation secondary to fall, social situation per chart review. 6. Coronary artery disease, CABG 2004, stent placement 2006, history of ischemic cardiomyopathy with recovery of LV function. Asymptomatic. 7. Aortic stenosis: Appears asymptomatic. Followup as an outpatient. 8. Cigarette smoker, tobacco dependence: Recommend cessation. 9. Noncompliance: Has been noncompliant with followup.   Followup with cardiology will be arranged for next week. He reports that he has been taking Lasix 20 mg daily. Continue this at this time.  Will discontinue metformin, rationale as above  Nicotine patch, nicotine gum offered, smoking cessation recommended  Followup with primary care physician in one week for followup on blood sugars of metformin  Pending studies:   None  Code Status: full DVT prophylaxis: heparin Family Communication: none present Disposition Plan: home   Brendia Sacks, MD  [facility]  Pager  [phone] If 7PM-7AM, please contact night-coverage at [email or web address], password [facility] 11/02/2012, 1:29 PM  LOS: 1 day   Summary: 67 year old man with ongoing cigarette use, history of coronary artery disease, CABG, stent placement, diastolic heart failure, aortic stenosis presented to the emergency department with sudden onset of shortness of breath. He rapidly improved with nebulizer therapy in the emergency department. Chest x-ray suggested decompensated heart failure and patient was admitted for further treatment.  Consultants:  None   Procedures:  None   HPI/Subjective: Continues to feel much better. Breathing well. "Can I go home?" Up without difficulty. No lightheadedness.  Objective: Filed Vitals:   11/02/12 0200 11/02/12 0603 11/02/12 0622 11/02/12 1029  BP: 81/53 91/54  94/54  Pulse: 59 60  90  Temp: 97.4 F (36.3 C) 97.3 F (36.3 C)    TempSrc: Oral Oral    Resp: 21 21    Height:      Weight:  73.5 kg (162 lb 0.6 oz)    SpO2: 92% 90% 98%     Intake/Output Summary (Last 24 hours) at 11/02/12 1329 Last data filed at 11/02/12 0240  Gross per 24 hour  Intake    240 ml  Output    700 ml  Net   -460 ml     Filed Weights   11/01/12 0415 11/01/12 0500 11/02/12 0603  Weight: 78.5 kg (173 lb 1 oz) 78.5 kg (173 lb 1 oz) 73.5 kg (162 lb 0.6 oz)    Exam:   Afebrile, vital signs stable. Borderline low systolic blood pressure in the 90s, asymptomatic. No longer hypoxic.  General: Appears calm and comfortable. Speech fluent and clear.  Cardiovascular: Regular rate and rhythm. No murmur, rub or gallop. No lower extremity edema.  Respiratory: Clear to auscultation bilaterally. No wheezes, rales or rhonchi. Normal respiratory effort.  Psychiatric: Grossly  normal mood and affect.   Data Reviewed:  Capillary blood sugars overall stable  Modest elevation of BUN and creatinine. Creatinine near baseline from 06/2012.  Scheduled Meds: . amLODipine  10 mg Oral Daily  .  aspirin EC  81 mg Oral Daily  . atorvastatin  40 mg Oral q1800  . heparin  5,000 Units Subcutaneous Q8H  . insulin aspart  0-5 Units Subcutaneous QHS  . insulin aspart  0-9 Units Subcutaneous TID WC  . metoprolol tartrate  25 mg Oral BID  . niacin  500 mg Oral QHS  . nicotine  7 mg Transdermal Daily  . potassium chloride  10 mEq Oral BID  . predniSONE  40 mg Oral Q breakfast  . sodium chloride  3 mL Intravenous Q12H   Continuous Infusions:   Principal Problem:   Acute respiratory failure with hypoxia Active Problems:   Coronary artery disease   Hypertension   Atypical atrial flutter   Diabetes mellitus   Aortic stenosis   Tobacco abuse   Acute diastolic congestive heart failure   COPD exacerbation   Tobacco dependence

## 2012-11-02 NOTE — Discharge Summary (Addendum)
Physician Discharge Summary  [name]:[id number] DOB: [birth year] DOA: 11/01/2012  PCP: Mitzi Hansen, NP  Admit date: 11/01/2012 Discharge date: 11/02/2012  Recommendations for Outpatient Follow-up:  1. Followup presumed acute diastolic heart failure--11/24 @ 1:30 with [facility] 2. Metformin has been discontinued given history of pulmonary edema and presumed heart failure. See discussion below. Consider alternative agent if needed. 3. Continue to recommend cessation from cigarette smoking  4. Continue to encourage compliance  Follow-up Information   Follow up with [name],JANICE, NP In 1 week.   Specialty:  Nurse Practitioner   Contact information:   [address] [place] [address] [phone]      Discharge Diagnoses:  1. Acute respiratory failure with hypoxia 2. Acute presumed diastolic congestive heart failure 3. COPD 4. Diabetes mellitus type 2 well controlled 5. Atypical atrial flutter 6. History of coronary artery disease 7. Tobacco dependence 8. Noncompliance  Discharge Condition: Improved Disposition: Home  Diet recommendation: Heart healthy diabetic diet  Filed Weights   11/01/12 0415 11/01/12 0500 11/02/12 0603  Weight: 78.5 kg (173 lb 1 oz) 78.5 kg (173 lb 1 oz) 73.5 kg (162 lb 0.6 oz)    History of present illness:  67 year old man with ongoing cigarette use, history of coronary artery disease, CABG, stent placement, diastolic heart failure, aortic stenosis presented to the emergency department with sudden onset of shortness of breath. He rapidly improved with nebulizer therapy in the emergency department. Chest x-ray suggested decompensated heart failure and patient was admitted for further treatment.  Hospital Course:  [name] was admitted for further evaluation and treatment of acute hypoxic respiratory failure and presumed diastolic congestive heart failure. He rapidly improved with diuresis. COPD exacerbation was  considered but not thought likely be some further information. Compliance has been an issue in the past and this is the most likely etiology for his acute decompensation. Outpatient followup will be arranged with cardiology. Hospitalization was uncomplicated.  1. Acute respiratory failure with hypoxia: Resolved. Likely secondary to acute diastolic heart failure. COPD was considered, however patient is not requiring further nebulizers and responded rapidly to diuresis. 2. Acute diastolic (presumed) congestive heart failure: Last echocardiogram 06/03/2012: LVEF 55%. No comment on diastolic function 3. COPD: Component of COPD exacerbation was considered, but given the rapid improvement and clinical findings this is now doubted. No further treatment at this time.  4. Diabetes mellitus: Well controlled during this hospitalization with minimal sliding scale insulin even while on steroids. Given heart failure will discontinue metformin. Followup as sugars closely with PCP next week in diet. Last hemoglobin A1c in June 5 5.5. 5. Atypical atrial flutter: Currently in sinus rhythm. Not a candidate for anticoagulation secondary to fall, social situation per chart review. 6. Coronary artery disease, CABG 2004, stent placement 2006, history of ischemic cardiomyopathy with recovery of LV function. Asymptomatic. 7. Aortic stenosis: Appears asymptomatic. Followup as an outpatient. 8. Cigarette smoker, tobacco dependence: Recommend cessation. 9. Noncompliance: Has been noncompliant with followup. Followup with cardiology will be arranged for next week. He reports that he has been taking Lasix 20 mg daily. Continue this at this time.  Will discontinue metformin, rationale as above  Nicotine patch, nicotine gum offered, smoking cessation recommended  Followup with primary care physician in one week for followup on blood sugars of metformin  Consultants:  None  Procedures:  None   Discharge Instructions  Discharge  Orders   Future Orders Complete By Expires   Activity as tolerated - No restrictions  As directed  Diet - low sodium heart healthy  As directed    Diet Carb Modified  As directed    Discharge instructions  As directed    Comments:     Continue Lasix as you have been instructed. Call your physician or seek immediate medical attention for increasing shortness of breath or worsening of condition. It is very important that you followup with cardiology, office will call 11/3 for followup appointment. For now hold metformin because of heart failure and check your blood sugars at least twice daily. Call your physician or seek immediate medical attention for blood sugars over 300.       Medication List    STOP taking these medications       metFORMIN 500 MG tablet  Commonly known as:  GLUCOPHAGE      TAKE these medications       ALPRAZolam 1 MG tablet  Commonly known as:  XANAX  Take 1 mg by mouth 4 (four) times daily.     amLODipine 10 MG tablet  Commonly known as:  NORVASC  Take 1 tablet (10 mg total) by mouth daily.     aspirin 81 MG EC tablet  Take 1 tablet (81 mg total) by mouth daily.     metoprolol succinate 50 MG 24 hr tablet  Commonly known as:  TOPROL-XL  Take 50 mg by mouth daily. Take with or immediately following a meal.     nicotine 7 mg/24hr patch  Commonly known as:  NICODERM CQ - dosed in mg/24 hr  Place 1 patch onto the skin daily.     nicotine polacrilex 2 MG gum  Commonly known as:  NICORETTE  Take 1 each (2 mg total) by mouth as needed for smoking cessation.     NITROSTAT 0.4 MG SL tablet  Generic drug:  nitroGLYCERIN  Place 0.4 mg under the tongue every 5 (five) minutes as needed.     ondansetron 8 MG disintegrating tablet  Commonly known as:  ZOFRAN-ODT  Take 8 mg by mouth every 8 (eight) hours as needed for nausea.     oxyCODONE-acetaminophen 10-325 MG per tablet  Commonly known as:  PERCOCET  Take 1 tablet by mouth every 4 (four) hours as  needed. For pain       Allergies  Allergen Reactions  . Benadryl [Diphenhydramine Hcl] Other (See Comments)    Hot Flashes "Like Niaspan"   The results of significant diagnostics from this hospitalization (including imaging, microbiology, ancillary and laboratory) are listed below for reference.    Significant Diagnostic Studies: Dg Chest 1 View  11/01/2012   CLINICAL DATA:  Shortness of breath  EXAM: CHEST - 1 VIEW  COMPARISON:  06/05/2012  FINDINGS: Diffuse interstitial opacities with hazy bilateral airspace disease. Stable cardiomegaly. CABG changes. Possible small effusions. No pneumothorax.  IMPRESSION: Congestive heart failure.   Electronically Signed   By: Tiburcio Pea M.D.   On: 11/01/2012 01:29   Labs: Basic Metabolic Panel:  Recent Labs Lab 11/01/12 0056 11/02/12 0608  NA 138 132*  K 3.9 4.1  CL 98 94*  CO2 31 29  GLUCOSE 164* 132*  BUN 14 29*  CREATININE 1.43* 1.48*  CALCIUM 9.3 9.5  MG 1.9  --    CBC:  Recent Labs Lab 11/01/12 0056  WBC 14.0*  NEUTROABS 10.7*  HGB 13.7  HCT 41.8  MCV 85.0  PLT 218   Cardiac Enzymes:  Recent Labs Lab 11/01/12 0056  TROPONINI <0.30    Recent Labs  06/02/12 2240 06/05/12 0459 11/01/12 0056  PROBNP 5064.0* 4068.0* 7093.0*   CBG:  Recent Labs Lab 11/01/12 1134 11/01/12 1645 11/01/12 2050 11/02/12 0737 11/02/12 1127  GLUCAP 165* 102* 305* 111* 177*    Principal Problem:   Acute respiratory failure with hypoxia Active Problems:   Coronary artery disease   Hypertension   Atypical atrial flutter   Diabetes mellitus   Aortic stenosis   Tobacco abuse   Acute diastolic congestive heart failure   COPD exacerbation   Tobacco dependence   Time coordinating discharge: 40 minutes  Signed:  Brendia Sacks, MD [facility] 11/02/2012, 1:59 PM

## 2012-11-02 NOTE — Progress Notes (Signed)
Patient on room air was 90-91%.  Put patient 2 liters via nasal cannula and patient's sats improved to 98%.  Will continue to monitor.    Ellene Route 11/02/2012

## 2012-11-04 ENCOUNTER — Emergency Department (HOSPITAL_COMMUNITY): Payer: PRIVATE HEALTH INSURANCE

## 2012-11-04 ENCOUNTER — Encounter (HOSPITAL_COMMUNITY): Payer: Self-pay | Admitting: Emergency Medicine

## 2012-11-04 ENCOUNTER — Emergency Department (HOSPITAL_COMMUNITY)
Admission: EM | Admit: 2012-11-04 | Discharge: 2012-11-04 | Disposition: A | Discharge status: Home / Self Care | Payer: PRIVATE HEALTH INSURANCE | Attending: Emergency Medicine | Admitting: Emergency Medicine

## 2012-11-04 DIAGNOSIS — Z79899 Other long term (current) drug therapy: Secondary | ICD-10-CM | POA: Insufficient documentation

## 2012-11-04 DIAGNOSIS — I509 Heart failure, unspecified: Secondary | ICD-10-CM | POA: Diagnosis not present

## 2012-11-04 DIAGNOSIS — J069 Acute upper respiratory infection, unspecified: Secondary | ICD-10-CM | POA: Insufficient documentation

## 2012-11-04 DIAGNOSIS — I10 Essential (primary) hypertension: Secondary | ICD-10-CM | POA: Insufficient documentation

## 2012-11-04 DIAGNOSIS — R0602 Shortness of breath: Secondary | ICD-10-CM | POA: Diagnosis present

## 2012-11-04 DIAGNOSIS — Z951 Presence of aortocoronary bypass graft: Secondary | ICD-10-CM | POA: Insufficient documentation

## 2012-11-04 DIAGNOSIS — F17200 Nicotine dependence, unspecified, uncomplicated: Secondary | ICD-10-CM | POA: Diagnosis not present

## 2012-11-04 DIAGNOSIS — E119 Type 2 diabetes mellitus without complications: Secondary | ICD-10-CM | POA: Diagnosis not present

## 2012-11-04 DIAGNOSIS — Z7982 Long term (current) use of aspirin: Secondary | ICD-10-CM | POA: Insufficient documentation

## 2012-11-04 DIAGNOSIS — I2510 Atherosclerotic heart disease of native coronary artery without angina pectoris: Secondary | ICD-10-CM | POA: Insufficient documentation

## 2012-11-04 LAB — CBC WITH DIFFERENTIAL/PLATELET
Basophils Absolute: 0 10*3/uL (ref 0.0–0.1)
Basophils Relative: 0 % (ref 0–1)
HCT: 41.4 % (ref 39.0–52.0)
Hemoglobin: 13.2 g/dL (ref 13.0–17.0)
Lymphocytes Relative: 22 % (ref 12–46)
Lymphs Abs: 1.6 10*3/uL (ref 0.7–4.0)
MCV: 85.9 fL (ref 78.0–100.0)
Monocytes Absolute: 0.5 10*3/uL (ref 0.1–1.0)
Neutro Abs: 4.5 10*3/uL (ref 1.7–7.7)
RBC: 4.82 MIL/uL (ref 4.22–5.81)
RDW: 14.5 % (ref 11.5–15.5)
WBC: 7 10*3/uL (ref 4.0–10.5)

## 2012-11-04 LAB — TROPONIN I: Troponin I: <0.3 ng/mL (ref <0.30)

## 2012-11-04 MED ORDER — METHYLPREDNISOLONE SODIUM SUCC 125 MG IJ SOLR
125.0000 mg | Freq: Once | INTRAMUSCULAR | Status: AC
  Administered 2012-11-04: 125 mg via INTRAVENOUS
  Filled 2012-11-04: qty 2

## 2012-11-04 MED ORDER — LEVOFLOXACIN 750 MG PO TABS: 750.0000 mg | ORAL_TABLET | Freq: Every day | ORAL | Status: DC

## 2012-11-04 MED ORDER — PREDNISONE 10 MG PO TABS: 20.0000 mg | ORAL_TABLET | Freq: Every day | ORAL | Status: DC

## 2012-11-04 MED ORDER — LEVOFLOXACIN 750 MG PO TABS
750.0000 mg | ORAL_TABLET | Freq: Once | ORAL | Status: AC
  Administered 2012-11-04: 750 mg via ORAL
  Filled 2012-11-04: qty 1

## 2012-11-04 MED ORDER — IPRATROPIUM BROMIDE 0.02 % IN SOLN
0.5000 mg | Freq: Once | RESPIRATORY_TRACT | Status: AC
  Administered 2012-11-04: 0.5 mg via RESPIRATORY_TRACT
  Filled 2012-11-04: qty 2.5

## 2012-11-04 MED ORDER — ALBUTEROL SULFATE HFA 108 (90 BASE) MCG/ACT IN AERS
2.0000 | INHALATION_SPRAY | RESPIRATORY_TRACT | Status: DC | PRN
  Administered 2012-11-04: 2 via RESPIRATORY_TRACT
  Filled 2012-11-04: qty 6.7

## 2012-11-04 MED ORDER — ALBUTEROL SULFATE (5 MG/ML) 0.5% IN NEBU
5.0000 mg | INHALATION_SOLUTION | Freq: Once | RESPIRATORY_TRACT | Status: AC
  Administered 2012-11-04: 5 mg via RESPIRATORY_TRACT
  Filled 2012-11-04: qty 1

## 2012-11-04 NOTE — ED Provider Notes (Signed)
CSN: [id number]     Arrival date & time 11/04/12  2111 History  This chart was scribed for Gerald Lennert, MD by Bennett Scrape, ED Scribe. This patient was seen in room APA08/APA08 and the patient's care was started at 9:21 PM.   Chief Complaint  Patient presents with  . Shortness of Breath    Patient is a 67 y.o. male presenting with shortness of breath. The history is provided by the patient. No language interpreter was used.  Shortness of Breath Severity:  Mild Onset quality:  Gradual Duration: tonight. Timing:  Constant Progression:  Worsening Chronicity:  Recurrent Context: URI   Relieved by:  Nothing Ineffective treatments:  None tried Associated symptoms: cough   Associated symptoms: no abdominal pain, no chest pain, no headaches and no rash   Cough:    Cough characteristics:  Productive   Sputum characteristics:  Yellow   HPI Comments: [name] is a 67 y.o. male with a h/o CHF who presents to the Emergency Department complaining of SOB that started tonight. He reports a cough productive of yellow sputum that started last week. He was recently seen 3 days ago for the same and was admitted for CHF. He states that since discharge 2 days ago he has not smoked his usual 2 to 3 cigarettes per day. He denies using oxygen or inhalers at home. He denies fevers, CP and nausea as associated symptoms.  Past Medical History  Diagnosis Date  . Coronary artery disease     a. CABG in 2004 - LIMA to LAD, SVG to diag, SVG to OM, SVG to PDA. EF was 40%. b. Cath 2006 s/p DES to prox RCA.  . Diabetes mellitus   . Hyperlipidemia   . Hypertension   . Atypical atrial flutter     a. Dx 06/2012. b. Not a candidate for anticoag due to fall, tenuous social situation.  . Ischemic cardiomyopathy     a. EF 40% in 2004. b. EF 55% in 06/2012.  Marland Kitchen Aortic stenosis     a. By echo 06/2012, very calcified but mean gradient - will need clinical f/u and serial echoes.  . Low TSH level    a. 06/2012, f/u pcp.  Marland Kitchen Poor social situation   . Cerebral hemorrhage     a. At time of CABG - had post-op encephalopathy and small frontal hemorrhage in 2004.  Marland Kitchen CHF (congestive heart failure)    Past Surgical History  Procedure Laterality Date  . Coronary artery bypass graft      2004   Family History  Problem Relation Age of Onset  . CAD Father 6   History  Substance Use Topics  . Smoking status: Current Every Day Smoker -- 0.25 packs/day for 50 years    Types: Cigarettes  . Smokeless tobacco: Never Used  . Alcohol Use: No     Comment: Pt states he has not drank in 2 years    Review of Systems  Constitutional: Negative for appetite change and fatigue.  HENT: Negative for congestion, ear discharge and sinus pressure.   Eyes: Negative for discharge.  Respiratory: Positive for cough and shortness of breath.   Cardiovascular: Negative for chest pain.  Gastrointestinal: Negative for abdominal pain and diarrhea.  Genitourinary: Negative for frequency and hematuria.  Musculoskeletal: Negative for back pain.  Skin: Negative for rash.  Neurological: Negative for seizures and headaches.  Psychiatric/Behavioral: Negative for hallucinations.    Allergies  Benadryl  Home Medications  Current Outpatient Rx  Name  Route  Sig  Dispense  Refill  . ALPRAZolam (XANAX) 1 MG tablet   Oral   Take 1 mg by mouth 4 (four) times daily.         Marland Kitchen amLODipine (NORVASC) 10 MG tablet   Oral   Take 1 tablet (10 mg total) by mouth daily.   30 tablet   6   . aspirin EC 81 MG EC tablet   Oral   Take 1 tablet (81 mg total) by mouth daily.         . metoprolol succinate (TOPROL-XL) 50 MG 24 hr tablet   Oral   Take 50 mg by mouth daily. Take with or immediately following a meal.         . nicotine (NICODERM CQ - DOSED IN MG/24 HR) 7 mg/24hr patch   Transdermal   Place 1 patch onto the skin daily.   28 patch   0   . nicotine polacrilex (NICORETTE) 2 MG gum   Oral   Take 1  each (2 mg total) by mouth as needed for smoking cessation.   100 tablet   0   . NITROSTAT 0.4 MG SL tablet   Sublingual   Place 0.4 mg under the tongue every 5 (five) minutes as needed.          . ondansetron (ZOFRAN-ODT) 8 MG disintegrating tablet   Oral   Take 8 mg by mouth every 8 (eight) hours as needed for nausea.          Marland Kitchen oxyCODONE-acetaminophen (PERCOCET) 10-325 MG per tablet   Oral   Take 1 tablet by mouth every 4 (four) hours as needed. For pain           Triage Vitals: BP 151/96  Pulse 75  Temp(Src) 97.8 F (36.6 C) (Oral)  Resp 18  Wt 162 lb (73.483 kg)  SpO2 94%  Physical Exam  Nursing note and vitals reviewed. Constitutional: He is oriented to person, place, and time. He appears well-developed and well-nourished.  Pt smells heavily of cigarette smoke  HENT:  Head: Normocephalic and atraumatic.  Eyes: Conjunctivae and EOM are normal. No scleral icterus.  Neck: Neck supple. No thyromegaly present.  Cardiovascular: Normal rate and regular rhythm.  Exam reveals no gallop and no friction rub.   No murmur heard. Pulmonary/Chest: Effort normal. No stridor. He has wheezes (minimal wheezing bilaterally). He has no rales. He exhibits no tenderness.  Abdominal: Soft. He exhibits no distension. There is no tenderness. There is no rebound.  Musculoskeletal: Normal range of motion. He exhibits no edema.  Lymphadenopathy:    He has no cervical adenopathy.  Neurological: He is oriented to person, place, and time. He exhibits normal muscle tone. Coordination normal.  Skin: No rash noted. No erythema.  Psychiatric: He has a normal mood and affect. His behavior is normal.    ED Course  Procedures (including critical care time)  Medications  albuterol (PROVENTIL) (5 MG/ML) 0.5% nebulizer solution 5 mg (not administered)  ipratropium (ATROVENT) nebulizer solution 0.5 mg (not administered)  methylPREDNISolone sodium succinate (SOLU-MEDROL) 125 mg/2 mL injection 125  mg (not administered)    DIAGNOSTIC STUDIES: Oxygen Saturation is 94% on room air, adequate by my interpretation.    COORDINATION OF CARE: 9:24 PM-Discussed treatment plan which includes medications, CXR, CBC panel, BNP and troponin with pt at bedside and pt agreed to plan.   10:19 PM-Pt rechecked and feels improved. Oxy Stats are 95%  on RA. Pt currently getting EKG.  10:26 PM-Pt rechecked and upon re-exam, wheezing has improved. Informed pt of radiology and lab work results. Discussed discharge plan which includes albuterol inhaler and antibiotics with pt and pt agreed to plan. Also advised pt to follow up as needed and pt agreed. Addressed symptoms to return for with pt.   Labs Review Labs Reviewed  PRO B NATRIURETIC PEPTIDE - Abnormal; Notable for the following:    Pro B Natriuretic peptide (BNP) 6884.0 (*)    All other components within normal limits  TROPONIN I  CBC WITH DIFFERENTIAL   Imaging Review Dg Chest Port 1 View  11/04/2012   CLINICAL DATA:  67 year old male with shortness of breath. Initial encounter.  EXAM: PORTABLE CHEST - 1 VIEW  COMPARISON:  11/01/2012 and earlier.  FINDINGS: Portable AP upright view at 2140 hr. Continued increased interstitial markings diffusely favored to reflect interstitial edema. Small volume fluid in the right minor fissure. Stable cardiomegaly and mediastinal contours. No pneumothorax. No consolidation or large effusion.  IMPRESSION: Continued pulmonary interstitial edema, not significantly changed since 11/01/2012. Less likely the appearance could reflect viral/ atypical respiratory infection.   Electronically Signed   By: Augusto Gamble M.D.   On: 11/04/2012 22:00    EKG Interpretation     Ventricular Rate:  77 PR Interval:  144 QRS Duration: 132 QT Interval:  444 QTC Calculation: 502 R Axis:   -36 Text Interpretation:  Sinus rhythm with Premature supraventricular complexes Left axis deviation Right bundle branch block Abnormal ECG When  compared with ECG of 01-Nov-2012 01:30, T wave amplitude has increased in Lateral leads            MDM  No diagnosis found. Glenford Peers.  Pt improved with tx  The chart was scribed for me under my direct supervision.  I personally performed the history, physical, and medical decision making and all procedures in the evaluation of this patient.Gerald Lennert, MD 11/04/12 2232

## 2012-11-04 NOTE — ED Notes (Signed)
Pt reporting SOB starting tonight.  Reporting productive cough.  Pt reports he hasn't smoked in 3 days.  Reporting productive cough.

## 2012-11-24 ENCOUNTER — Encounter: Payer: PRIVATE HEALTH INSURANCE | Admitting: Physician Assistant

## 2013-03-01 ENCOUNTER — Observation Stay (HOSPITAL_COMMUNITY): Payer: PRIVATE HEALTH INSURANCE

## 2013-03-01 ENCOUNTER — Emergency Department (HOSPITAL_COMMUNITY): Payer: PRIVATE HEALTH INSURANCE

## 2013-03-01 ENCOUNTER — Encounter (HOSPITAL_COMMUNITY): Payer: Self-pay | Admitting: Emergency Medicine

## 2013-03-01 ENCOUNTER — Observation Stay (HOSPITAL_COMMUNITY)
Admission: EM | Admit: 2013-03-01 | Discharge: 2013-03-03 | Disposition: A | Discharge status: Home / Self Care | Payer: PRIVATE HEALTH INSURANCE | Attending: Family Medicine | Admitting: Family Medicine

## 2013-03-01 DIAGNOSIS — I484 Atypical atrial flutter: Secondary | ICD-10-CM

## 2013-03-01 DIAGNOSIS — Z951 Presence of aortocoronary bypass graft: Secondary | ICD-10-CM | POA: Insufficient documentation

## 2013-03-01 DIAGNOSIS — E119 Type 2 diabetes mellitus without complications: Secondary | ICD-10-CM | POA: Insufficient documentation

## 2013-03-01 DIAGNOSIS — K5900 Constipation, unspecified: Secondary | ICD-10-CM

## 2013-03-01 DIAGNOSIS — E785 Hyperlipidemia, unspecified: Secondary | ICD-10-CM | POA: Insufficient documentation

## 2013-03-01 DIAGNOSIS — I359 Nonrheumatic aortic valve disorder, unspecified: Secondary | ICD-10-CM | POA: Insufficient documentation

## 2013-03-01 DIAGNOSIS — I2510 Atherosclerotic heart disease of native coronary artery without angina pectoris: Secondary | ICD-10-CM | POA: Insufficient documentation

## 2013-03-01 DIAGNOSIS — I4892 Unspecified atrial flutter: Principal | ICD-10-CM | POA: Insufficient documentation

## 2013-03-01 DIAGNOSIS — I350 Nonrheumatic aortic (valve) stenosis: Secondary | ICD-10-CM

## 2013-03-01 DIAGNOSIS — Z888 Allergy status to other drugs, medicaments and biological substances status: Secondary | ICD-10-CM | POA: Insufficient documentation

## 2013-03-01 DIAGNOSIS — Z7982 Long term (current) use of aspirin: Secondary | ICD-10-CM | POA: Insufficient documentation

## 2013-03-01 DIAGNOSIS — I428 Other cardiomyopathies: Secondary | ICD-10-CM | POA: Insufficient documentation

## 2013-03-01 DIAGNOSIS — I2589 Other forms of chronic ischemic heart disease: Secondary | ICD-10-CM | POA: Insufficient documentation

## 2013-03-01 DIAGNOSIS — I10 Essential (primary) hypertension: Secondary | ICD-10-CM | POA: Insufficient documentation

## 2013-03-01 DIAGNOSIS — Z87891 Personal history of nicotine dependence: Secondary | ICD-10-CM | POA: Insufficient documentation

## 2013-03-01 DIAGNOSIS — I509 Heart failure, unspecified: Secondary | ICD-10-CM | POA: Insufficient documentation

## 2013-03-01 DIAGNOSIS — K5641 Fecal impaction: Secondary | ICD-10-CM | POA: Diagnosis present

## 2013-03-01 HISTORY — DX: Patient's noncompliance with other medical treatment and regimen: Z91.19

## 2013-03-01 HISTORY — DX: Type 2 diabetes mellitus without complications: E11.9

## 2013-03-01 HISTORY — DX: Essential (primary) hypertension: I10

## 2013-03-01 HISTORY — DX: Atherosclerotic heart disease of native coronary artery without angina pectoris: I25.10

## 2013-03-01 HISTORY — DX: Patient's noncompliance with other medical treatment and regimen due to unspecified reason: Z91.199

## 2013-03-01 LAB — COMPREHENSIVE METABOLIC PANEL
ALBUMIN: 3.4 g/dL — AB (ref 3.5–5.2)
ALK PHOS: 101 U/L (ref 39–117)
ALT: 18 U/L (ref 0–53)
AST: 21 U/L (ref 0–37)
BUN: 18 mg/dL (ref 6–23)
CALCIUM: 9.4 mg/dL (ref 8.4–10.5)
CO2: 28 mEq/L (ref 19–32)
Chloride: 103 mEq/L (ref 96–112)
Creatinine, Ser: 1.23 mg/dL (ref 0.50–1.35)
GFR calc Af Amer: 67 mL/min — ABNORMAL LOW (ref >90)
GFR calc non Af Amer: 58 mL/min — ABNORMAL LOW (ref >90)
GLUCOSE: 98 mg/dL (ref 70–99)
Potassium: 4.1 mEq/L (ref 3.7–5.3)
SODIUM: 141 meq/L (ref 137–147)
TOTAL PROTEIN: 7.5 g/dL (ref 6.0–8.3)
Total Bilirubin: 0.6 mg/dL (ref 0.3–1.2)

## 2013-03-01 LAB — GLUCOSE, CAPILLARY
GLUCOSE-CAPILLARY: 132 mg/dL — AB (ref 70–99)
Glucose-Capillary: 211 mg/dL — ABNORMAL HIGH (ref 70–99)
Glucose-Capillary: 201 mg/dL — ABNORMAL HIGH (ref 70–99)

## 2013-03-01 LAB — CBC WITH DIFFERENTIAL/PLATELET
BASOS ABS: 0 10*3/uL (ref 0.0–0.1)
Basophils Relative: 0 % (ref 0–1)
EOS ABS: 0.2 10*3/uL (ref 0.0–0.7)
EOS PCT: 3 % (ref 0–5)
HCT: 45 % (ref 39.0–52.0)
Hemoglobin: 14.2 g/dL (ref 13.0–17.0)
Lymphocytes Relative: 19 % (ref 12–46)
Lymphs Abs: 1.3 10*3/uL (ref 0.7–4.0)
MCH: 26.7 pg (ref 26.0–34.0)
MCHC: 31.6 g/dL (ref 30.0–36.0)
MCV: 84.7 fL (ref 78.0–100.0)
Monocytes Absolute: 0.6 10*3/uL (ref 0.1–1.0)
Monocytes Relative: 9 % (ref 3–12)
Neutro Abs: 4.9 10*3/uL (ref 1.7–7.7)
Neutrophils Relative %: 70 % (ref 43–77)
PLATELETS: 174 10*3/uL (ref 150–400)
RBC: 5.31 MIL/uL (ref 4.22–5.81)
RDW: 15.5 % (ref 11.5–15.5)
WBC: 7 10*3/uL (ref 4.0–10.5)

## 2013-03-01 LAB — MRSA PCR SCREENING: MRSA by PCR: NEGATIVE

## 2013-03-01 LAB — LACTIC ACID, PLASMA: Lactic Acid, Venous: 1.2 mmol/L (ref 0.5–2.2)

## 2013-03-01 MED ORDER — POLYETHYLENE GLYCOL 3350 17 G PO PACK
PACK | ORAL | Status: AC
  Filled 2013-03-01: qty 1

## 2013-03-01 MED ORDER — OXYCODONE HCL 5 MG PO TABS
5.0000 mg | ORAL_TABLET | Freq: Three times a day (TID) | ORAL | Status: DC | PRN
Start: 2013-03-01 — End: 2013-03-03

## 2013-03-01 MED ORDER — METOPROLOL TARTRATE 50 MG PO TABS
50.0000 mg | ORAL_TABLET | Freq: Two times a day (BID) | ORAL | Status: DC
  Administered 2013-03-01 (×2): 50 mg via ORAL
  Filled 2013-03-01 (×2): qty 1

## 2013-03-01 MED ORDER — ALPRAZOLAM 1 MG PO TABS: 1.0000 mg | ORAL_TABLET | Freq: Four times a day (QID) | ORAL | Status: DC | PRN

## 2013-03-01 MED ORDER — SODIUM CHLORIDE 0.9 % IV SOLN: 1000.0000 mL | Freq: Once | INTRAVENOUS | Status: DC

## 2013-03-01 MED ORDER — OXYCODONE-ACETAMINOPHEN 5-325 MG PO TABS: 1.0000 | ORAL_TABLET | Freq: Three times a day (TID) | ORAL | Status: DC | PRN

## 2013-03-01 MED ORDER — METOPROLOL TARTRATE 1 MG/ML IV SOLN
5.0000 mg | Freq: Once | INTRAVENOUS | Status: AC
  Administered 2013-03-01: 5 mg via INTRAVENOUS
  Filled 2013-03-01: qty 5

## 2013-03-01 MED ORDER — SODIUM CHLORIDE 0.9 % IV SOLN: INTRAVENOUS | Status: DC

## 2013-03-01 MED ORDER — METOPROLOL TARTRATE 1 MG/ML IV SOLN
5.0000 mg | Freq: Four times a day (QID) | INTRAVENOUS | Status: DC | PRN
  Administered 2013-03-01 (×2): 5 mg via INTRAVENOUS
  Filled 2013-03-01 (×2): qty 5

## 2013-03-01 MED ORDER — METOPROLOL SUCCINATE ER 50 MG PO TB24: 50.0000 mg | ORAL_TABLET | Freq: Every day | ORAL | Status: DC

## 2013-03-01 MED ORDER — POLYETHYLENE GLYCOL 3350 17 G PO PACK: 17.0000 g | PACK | Freq: Every day | ORAL | Status: DC

## 2013-03-01 MED ORDER — SODIUM CHLORIDE 0.9 % IV SOLN: 1000.0000 mL | INTRAVENOUS | Status: DC

## 2013-03-01 MED ORDER — MAGNESIUM CITRATE PO SOLN
1.0000 | Freq: Once | ORAL | Status: AC | PRN
  Administered 2013-03-01: 1 via ORAL
  Filled 2013-03-01 (×2): qty 296

## 2013-03-01 MED ORDER — ASPIRIN EC 81 MG PO TBEC
81.0000 mg | DELAYED_RELEASE_TABLET | Freq: Every day | ORAL | Status: DC
  Administered 2013-03-01 – 2013-03-03 (×3): 81 mg via ORAL
  Filled 2013-03-01 (×3): qty 1

## 2013-03-01 MED ORDER — ENOXAPARIN SODIUM 40 MG/0.4ML ~~LOC~~ SOLN
40.0000 mg | SUBCUTANEOUS | Status: DC
  Administered 2013-03-01 – 2013-03-03 (×3): 40 mg via SUBCUTANEOUS
  Filled 2013-03-01 (×3): qty 0.4

## 2013-03-01 MED ORDER — DILTIAZEM HCL 100 MG IV SOLR
5.0000 mg/h | INTRAVENOUS | Status: DC
  Administered 2013-03-01 (×2): 10 mg/h via INTRAVENOUS
  Filled 2013-03-01: qty 100

## 2013-03-01 MED ORDER — NICOTINE 7 MG/24HR TD PT24
7.0000 mg | MEDICATED_PATCH | Freq: Every day | TRANSDERMAL | Status: DC
  Administered 2013-03-01 – 2013-03-03 (×3): 7 mg via TRANSDERMAL
  Filled 2013-03-01 (×6): qty 1

## 2013-03-01 MED ORDER — SODIUM CHLORIDE 0.9 % IV SOLN
INTRAVENOUS | Status: DC
  Administered 2013-03-01: 18:00:00 via INTRAVENOUS

## 2013-03-01 MED ORDER — NITROGLYCERIN 0.4 MG SL SUBL: 0.4000 mg | SUBLINGUAL_TABLET | SUBLINGUAL | Status: DC | PRN

## 2013-03-01 MED ORDER — INSULIN ASPART 100 UNIT/ML ~~LOC~~ SOLN
0.0000 [IU] | Freq: Three times a day (TID) | SUBCUTANEOUS | Status: DC
  Administered 2013-03-01: 3 [IU] via SUBCUTANEOUS
  Administered 2013-03-01 – 2013-03-03 (×4): 1 [IU] via SUBCUTANEOUS

## 2013-03-01 MED ORDER — OXYCODONE-ACETAMINOPHEN 10-325 MG PO TABS: 1.0000 | ORAL_TABLET | Freq: Three times a day (TID) | ORAL | Status: DC | PRN

## 2013-03-01 MED ORDER — SODIUM CHLORIDE 0.9 % IV BOLUS (SEPSIS)
250.0000 mL | Freq: Once | INTRAVENOUS | Status: AC
  Administered 2013-03-01: 250 mL via INTRAVENOUS

## 2013-03-01 MED ORDER — POLYETHYLENE GLYCOL 3350 17 G PO PACK
34.0000 g | PACK | Freq: Once | ORAL | Status: AC
  Administered 2013-03-01: 34 g via ORAL
  Filled 2013-03-01: qty 2

## 2013-03-01 MED ORDER — DILTIAZEM HCL 25 MG/5ML IV SOLN
5.0000 mg | Freq: Once | INTRAVENOUS | Status: AC
  Administered 2013-03-01: 5 mg via INTRAVENOUS
  Filled 2013-03-01: qty 5

## 2013-03-01 NOTE — H&P (Signed)
[facility] History and Physical  [name] TGG:[id number] DOB: [birth year] DOA: 03/01/2013  Referring physician: Dr Roxanne Mins PCP: Carolee Rota, NP   Chief Complaint: Constipation.   HPI: [name] is a 68 y.o. male with PMH significant for A fib, Diabetes, Cardiomyopathy Ef 40 %, who presents to ED complaining of constipation. He has not had a BM in 11 days. He does relates at time cramping abdominal pain. He can not tell me if he has lost weight. He was able to have small BM in the ED. He was also notice to be  A fib RVR. KUB showed; 1. Rectum distended with stool to 10.2 cm in transverse dimension, raising concern for fecal impaction. Relatively small amount of stool noted in the remainder of the colon. He received 2 doses of IV lopressor in the ED.     Review of Systems:  Negative except as per HPI.    Past Medical History  Diagnosis Date  . Coronary artery disease     a. CABG in 2004 - LIMA to LAD, SVG to diag, SVG to OM, SVG to PDA. EF was 40%. b. Cath 2006 s/p DES to prox RCA.  . Diabetes mellitus   . Hyperlipidemia   . Hypertension   . Atypical atrial flutter     a. Dx 06/2012. b. Not a candidate for anticoag due to fall, tenuous social situation.  . Ischemic cardiomyopathy     a. EF 40% in 2004. b. EF 55% in 06/2012.  Marland Kitchen Aortic stenosis     a. By echo 06/2012, very calcified but mean gradient 54mmHg - will need clinical f/u and serial echoes.  . Low TSH level     a. 06/2012, f/u pcp.  Marland Kitchen Poor social situation   . Cerebral hemorrhage     a. At time of CABG - had post-op encephalopathy and small frontal hemorrhage in 2004.  Marland Kitchen CHF (congestive heart failure)    Past Surgical History  Procedure Laterality Date  . Coronary artery bypass graft      2004   Social History:  reports that he has been smoking Cigarettes.  He has a 12.5 pack-year smoking history. He has never used smokeless tobacco. He reports that he does not drink alcohol or use illicit  drugs.  Allergies  Allergen Reactions  . Benadryl [Diphenhydramine Hcl] Other (See Comments)    Hot Flashes "Like Niaspan"    Family History  Problem Relation Age of Onset  . CAD Father 41     Prior to Admission medications   Medication Sig Start Date End Date Taking? Authorizing Provider  ALPRAZolam Duanne Moron) 1 MG tablet Take 1 mg by mouth 4 (four) times daily.    Historical Provider, MD  amLODipine (NORVASC) 10 MG tablet Take 1 tablet (10 mg total) by mouth daily. 06/04/12   Dayna N Dunn, PA-C  aspirin EC 81 MG EC tablet Take 1 tablet (81 mg total) by mouth daily. 11/02/12   Samuella Cota, MD  levofloxacin (LEVAQUIN) 750 MG tablet Take 1 tablet (750 mg total) by mouth daily. X 7 days 11/04/12   Maudry Diego, MD  metoprolol succinate (TOPROL-XL) 50 MG 24 hr tablet Take 50 mg by mouth daily. Take with or immediately following a meal.    Historical Provider, MD  nicotine (NICODERM CQ - DOSED IN MG/24 HR) 7 mg/24hr patch Place 1 patch onto the skin daily. 11/02/12   Samuella Cota, MD  nicotine polacrilex (NICORETTE) 2 MG gum Take  1 each (2 mg total) by mouth as needed for smoking cessation. 11/02/12   Samuella Cota, MD  NITROSTAT 0.4 MG SL tablet Place 0.4 mg under the tongue every 5 (five) minutes as needed.  10/30/12   Historical Provider, MD  ondansetron (ZOFRAN-ODT) 8 MG disintegrating tablet Take 8 mg by mouth every 8 (eight) hours as needed for nausea.  08/29/12   Historical Provider, MD  oxyCODONE-acetaminophen (PERCOCET) 10-325 MG per tablet Take 1 tablet by mouth every 4 (four) hours as needed. For pain     Historical Provider, MD  predniSONE (DELTASONE) 10 MG tablet Take 2 tablets (20 mg total) by mouth daily. 11/04/12   Maudry Diego, MD   Physical Exam: Filed Vitals:   03/01/13 0813  BP: 102/73  Pulse: 135  Temp:   Resp: 16    BP 102/73  Pulse 135  Temp(Src) 98.7 F (37.1 C) (Oral)  Resp 16  Ht 6' (1.829 m)  Wt 74.844 kg (165 lb)  BMI 22.37 kg/m2  SpO2  100%  General:  Appears calm and comfortable Eyes: PERRL, normal lids, irises & conjunctiva ENT: grossly normal hearing, lips & tongue Neck: no LAD, masses or thyromegaly Cardiovascular: RRR, no m/r/g. Bilateral lower extremities edema.  Telemetry:  Afib, HR 130.   Respiratory: CTA bilaterally, no w/r/r. Normal respiratory effort. Abdomen: soft, ntnd Skin: no rash or induration seen on limited exam Musculoskeletal: grossly normal tone BUE/BLE Neurologic: grossly non-focal.          Labs on Admission:  Basic Metabolic Panel:  Recent Labs Lab 03/01/13 0340  NA 141  K 4.1  CL 103  CO2 28  GLUCOSE 98  BUN 18  CREATININE 1.23  CALCIUM 9.4   Liver Function Tests:  Recent Labs Lab 03/01/13 0340  AST 21  ALT 18  ALKPHOS 101  BILITOT 0.6  PROT 7.5  ALBUMIN 3.4*   No results found for this basename: LIPASE, AMYLASE,  in the last 168 hours No results found for this basename: AMMONIA,  in the last 168 hours CBC:  Recent Labs Lab 03/01/13 0340  WBC 7.0  NEUTROABS 4.9  HGB 14.2  HCT 45.0  MCV 84.7  PLT 174   Cardiac Enzymes: No results found for this basename: CKTOTAL, CKMB, CKMBINDEX, TROPONINI,  in the last 168 hours  BNP (last 3 results)  Recent Labs  06/05/12 0459 11/01/12 0056 11/04/12 2131  PROBNP 4068.0* 7093.0* 6884.0*   CBG: No results found for this basename: GLUCAP,  in the last 168 hours  Radiological Exams on Admission: Dg Abd 1 View  03/01/2013   CLINICAL DATA:  Constipation, abdominal pain and cramping.  EXAM: ABDOMEN - 1 VIEW  COMPARISON:  Lumbar spine radiographs performed 11/16/2010  FINDINGS: The visualized bowel gas pattern is unremarkable. Scattered air and stool filled loops of colon are seen; no abnormal dilatation of small bowel loops is seen to suggest small bowel obstruction. No free intra-abdominal air is identified, though evaluation for free air is limited on a single supine view.  The rectum is distended with stool, measuring  10.2 cm in transverse dimension, raising concern for fecal impaction.  The visualized osseous structures are within normal limits; the sacroiliac joints are unremarkable in appearance. Scattered vascular calcifications are seen.  IMPRESSION: 1. Rectum distended with stool to 10.2 cm in transverse dimension, raising concern for fecal impaction. 2. Relatively small amount of stool noted in the remainder of the colon. Unremarkable bowel gas pattern; no free intra-abdominal air  seen.   Electronically Signed   By: Garald Balding M.D.   On: 03/01/2013 05:24    EKG: Independently reviewed. A fib RVR.   Assessment/Plan Active Problems:   Atrial flutter   Fecal impaction   1-A fib RVR; received 2 doses metoprolol in the ED. Hr decrease from 150 to 130. Will increase home dose metoprolol to BID. Will also order PRN IV lopressor.   2-Constipation: fecal impaction. He had small BM in the ED. He just received one dose of miralax. Will order mg citrate if no respond to miralax. Will repeat KUB in am. Clear diet.   3-History of Diabetes; Will order SSI.  4-Bilateral Lower extremity edema; he will need LE doppler.   Code Status: Full Code.  Family Communication: Care discussed with patient.  Disposition Plan: expect less than 2 days admission.   Time spent: 75 minutes.   [facility] Pager [phone]

## 2013-03-01 NOTE — Progress Notes (Signed)
Patient had very large formed BM independently after drinking mag citrate.  Was able to pass stool before enema given.  Patient states he feels much relieved.  Pulse has remained in 130's, though - MD aware.  Another dose of IV metoprolol given an hour early per MD orders. Will continue to monitor HR.

## 2013-03-01 NOTE — ED Notes (Signed)
Pt having BM at present time.

## 2013-03-01 NOTE — ED Notes (Signed)
Went to obtain vitals on patient. Not abel to at this time. Patient needed to go to restroom urgently. Having loose stool.

## 2013-03-01 NOTE — ED Notes (Signed)
Patient yelling from bathroom for someone to come in room. Patient asking for something to help him to go. Explained to patient that other procedures need to be done. Explained to patient that EKG still needs to be done. Patient states as soon as he get out of bathroom feels as if he has to go again. Patient pooped in floor in room.

## 2013-03-01 NOTE — ED Notes (Signed)
Attempted report. Nurse to call back.

## 2013-03-01 NOTE — ED Notes (Signed)
Pt ambulated to BR with no assistance 

## 2013-03-01 NOTE — ED Provider Notes (Signed)
CSN: [id number]     Arrival date & time 03/01/13  0256 History   First MD Initiated Contact with Patient 03/01/13 [phone]     Chief Complaint  Patient presents with  . Constipation     (Consider location/radiation/quality/duration/timing/severity/associated sxs/prior Treatment) Patient is a 68 y.o. male presenting with constipation. The history is provided by the patient.  Constipation He states that he has not had a bowel movement for about the last 10 days. He has tried taking over-the-counter laxative such as cast or oil with no benefit. There's been some crampy abdominal pain but no nausea or vomiting. He denies fever or chills. He has been on narcotics in the past but has not taken any narcotics in the past several weeks. He states that he tries to avoid narcotics because he knows that they can cause constipation.  Past Medical History  Diagnosis Date  . Coronary artery disease     a. CABG in 2004 - LIMA to LAD, SVG to diag, SVG to OM, SVG to PDA. EF was 40%. b. Cath 2006 s/p DES to prox RCA.  . Diabetes mellitus   . Hyperlipidemia   . Hypertension   . Atypical atrial flutter     a. Dx 06/2012. b. Not a candidate for anticoag due to fall, tenuous social situation.  . Ischemic cardiomyopathy     a. EF 40% in 2004. b. EF 55% in 06/2012.  Marland Kitchen Aortic stenosis     a. By echo 06/2012, very calcified but mean gradient 23mmHg - will need clinical f/u and serial echoes.  . Low TSH level     a. 06/2012, f/u pcp.  Marland Kitchen Poor social situation   . Cerebral hemorrhage     a. At time of CABG - had post-op encephalopathy and small frontal hemorrhage in 2004.  Marland Kitchen CHF (congestive heart failure)    Past Surgical History  Procedure Laterality Date  . Coronary artery bypass graft      2004   Family History  Problem Relation Age of Onset  . CAD Father 76   History  Substance Use Topics  . Smoking status: Current Every Day Smoker -- 0.25 packs/day for 50 years    Types: Cigarettes  . Smokeless tobacco:  Never Used  . Alcohol Use: No     Comment: Pt states he has not drank in 2 years    Review of Systems  Gastrointestinal: Positive for constipation.  All other systems reviewed and are negative.      Allergies  Benadryl  Home Medications   Current Outpatient Rx  Name  Route  Sig  Dispense  Refill  . ALPRAZolam (XANAX) 1 MG tablet   Oral   Take 1 mg by mouth 4 (four) times daily.         Marland Kitchen amLODipine (NORVASC) 10 MG tablet   Oral   Take 1 tablet (10 mg total) by mouth daily.   30 tablet   6   . aspirin EC 81 MG EC tablet   Oral   Take 1 tablet (81 mg total) by mouth daily.         Marland Kitchen levofloxacin (LEVAQUIN) 750 MG tablet   Oral   Take 1 tablet (750 mg total) by mouth daily. X 7 days   7 tablet   0   . metoprolol succinate (TOPROL-XL) 50 MG 24 hr tablet   Oral   Take 50 mg by mouth daily. Take with or immediately following a meal.         .  nicotine (NICODERM CQ - DOSED IN MG/24 HR) 7 mg/24hr patch   Transdermal   Place 1 patch onto the skin daily.   28 patch   0   . nicotine polacrilex (NICORETTE) 2 MG gum   Oral   Take 1 each (2 mg total) by mouth as needed for smoking cessation.   100 tablet   0   . NITROSTAT 0.4 MG SL tablet   Sublingual   Place 0.4 mg under the tongue every 5 (five) minutes as needed.          . ondansetron (ZOFRAN-ODT) 8 MG disintegrating tablet   Oral   Take 8 mg by mouth every 8 (eight) hours as needed for nausea.          Marland Kitchen oxyCODONE-acetaminophen (PERCOCET) 10-325 MG per tablet   Oral   Take 1 tablet by mouth every 4 (four) hours as needed. For pain          . predniSONE (DELTASONE) 10 MG tablet   Oral   Take 2 tablets (20 mg total) by mouth daily.   14 tablet   0    BP 104/75  Pulse 108  Temp(Src) 98.7 F (37.1 C) (Oral)  Resp 16  Ht 6' (1.829 m)  Wt 165 lb (74.844 kg)  BMI 22.37 kg/m2  SpO2 97% Physical Exam  Nursing note and vitals reviewed.  68 year old male, resting comfortably and in no  acute distress. Vital signs are significant for tachycardia with heart rate 108. However, at the time I am examining him, heart rate is 152. Oxygen saturation is 97%, which is normal. Head is normocephalic and atraumatic. PERRLA, EOMI. Oropharynx is clear. Neck is nontender and supple without adenopathy or JVD. Back is nontender and there is no CVA tenderness. Lungs are clear without rales, wheezes, or rhonchi. Chest is nontender. Heart is tachycardic without murmur. Abdomen is soft, flat, nontender without masses or hepatosplenomegaly and peristalsis is hypoactive. Rectal: Large, hard fecal impaction present. Stool is light brown and Hemoccult negative. Extremities have no cyanosis or edema, full range of motion is present. Skin is warm and dry without rash. Neurologic: Mental status is normal, cranial nerves are intact, there are no motor or sensory deficits.  ED Course  Procedures (including critical care time) Labs Review Results for orders placed during the hospital encounter of 03/01/13  CBC WITH DIFFERENTIAL      Result Value Ref Range   WBC 7.0  4.0 - 10.5 K/uL   RBC 5.31  4.22 - 5.81 MIL/uL   Hemoglobin 14.2  13.0 - 17.0 g/dL   HCT 45.0  39.0 - 52.0 %   MCV 84.7  78.0 - 100.0 fL   MCH 26.7  26.0 - 34.0 pg   MCHC 31.6  30.0 - 36.0 g/dL   RDW 15.5  11.5 - 15.5 %   Platelets 174  150 - 400 K/uL   Neutrophils Relative % 70  43 - 77 %   Neutro Abs 4.9  1.7 - 7.7 K/uL   Lymphocytes Relative 19  12 - 46 %   Lymphs Abs 1.3  0.7 - 4.0 K/uL   Monocytes Relative 9  3 - 12 %   Monocytes Absolute 0.6  0.1 - 1.0 K/uL   Eosinophils Relative 3  0 - 5 %   Eosinophils Absolute 0.2  0.0 - 0.7 K/uL   Basophils Relative 0  0 - 1 %   Basophils Absolute 0.0  0.0 - 0.1 K/uL  COMPREHENSIVE METABOLIC PANEL      Result Value Ref Range   Sodium 141  137 - 147 mEq/L   Potassium 4.1  3.7 - 5.3 mEq/L   Chloride 103  96 - 112 mEq/L   CO2 28  19 - 32 mEq/L   Glucose, Bld 98  70 - 99 mg/dL   BUN  18  6 - 23 mg/dL   Creatinine, Ser 1.23  0.50 - 1.35 mg/dL   Calcium 9.4  8.4 - 10.5 mg/dL   Total Protein 7.5  6.0 - 8.3 g/dL   Albumin 3.4 (*) 3.5 - 5.2 g/dL   AST 21  0 - 37 U/L   ALT 18  0 - 53 U/L   Alkaline Phosphatase 101  39 - 117 U/L   Total Bilirubin 0.6  0.3 - 1.2 mg/dL   GFR calc non Af Amer 58 (*) >90 mL/min   GFR calc Af Amer 67 (*) >90 mL/min  LACTIC ACID, PLASMA      Result Value Ref Range   Lactic Acid, Venous 1.2  0.5 - 2.2 mmol/L   Imaging Review Dg Abd 1 View  03/01/2013   CLINICAL DATA:  Constipation, abdominal pain and cramping.  EXAM: ABDOMEN - 1 VIEW  COMPARISON:  Lumbar spine radiographs performed 11/16/2010  FINDINGS: The visualized bowel gas pattern is unremarkable. Scattered air and stool filled loops of colon are seen; no abnormal dilatation of small bowel loops is seen to suggest small bowel obstruction. No free intra-abdominal air is identified, though evaluation for free air is limited on a single supine view.  The rectum is distended with stool, measuring 10.2 cm in transverse dimension, raising concern for fecal impaction.  The visualized osseous structures are within normal limits; the sacroiliac joints are unremarkable in appearance. Scattered vascular calcifications are seen.  IMPRESSION: 1. Rectum distended with stool to 10.2 cm in transverse dimension, raising concern for fecal impaction. 2. Relatively small amount of stool noted in the remainder of the colon. Unremarkable bowel gas pattern; no free intra-abdominal air seen.   Electronically Signed   By: Garald Balding M.D.   On: 03/01/2013 05:24   Images viewed by me.   EKG Interpretation   Date/Time:  Sunday March 01 2013 04:55:33 EST Ventricular Rate:  140 PR Interval:    QRS Duration: 132 QT Interval:  382 QTC Calculation: 583 R Axis:   -76 Text Interpretation:  Atrial flutter Left axis deviation Right bundle  branch block Abnormal ECG When compared with ECG of 04-Nov-2012 22:12,  Atrial  flutter has replaced Sinus rhythm Vent. rate has increased BY  63  BPM Confirmed by [facility]  MD, Kona Lover ([id number]) on 03/01/2013 5:50:01 AM      MDM   Final diagnoses:  Atrial flutter  Fecal impaction    Constipation with fecal impaction. Tachycardia. Old records were reviewed, and he has a history of atrial fibrillation/atrial flutter and ECG will be checked to see if he has reverted to that rhythm. Screening labs and abdominal x-rays have been ordered.  ECG confirms reversion to atrial flutter. He was treated with metoprolol which has decreased his heart rate to 137. He is given a second dose of metoprolol. He has passed a small amount of his fecal impaction but continues to have a sense of rectal urgency. He will be admitted under observation status to monitor his heart rate until he converts again, and also to give laxatives and/or enemas as needed to relieve his impaction. Case is  discussed with Dr. Tyrell Antonio of [facility] who agrees to admit the patient.  Delora Fuel, MD [phone] [id number]

## 2013-03-01 NOTE — ED Notes (Signed)
Pt assisted in bathroom and helped back to bed. Moderate amount of light brown semi-formed stool noted to toilet. Adult diaper also changed and pt cleaned and put back in bed. According to prior shift, pat had been on toilet for 1 1/2 hr. Monitor applied and medicated with lopressor as per previous order.

## 2013-03-01 NOTE — ED Notes (Signed)
Went in to do EKG, patient on toilet. States his bowel moved some after EDP did rectal check. States he wants some Mir-Lax now.

## 2013-03-01 NOTE — Progress Notes (Signed)
Patient transferred to stepdown bed per MD order d/t sustained HR in 130's.  Report given to J. Roda Shutters, RN.  Patient in NAD and transferred by [facility] with assist of RN.

## 2013-03-01 NOTE — Progress Notes (Signed)
Late Entry :  Patients HR sustaining in 130's when first arrived to floor.  PRN IV metoprolol and scheduled PO dose of metoprolol administered with no change.  MD notified and made aware.  Orders to give 1 dose of IV cardizem given and administered, still with no change.  250 ml bolus os NS being given at this time per MD orders.  Will continue to monitor HR.

## 2013-03-01 NOTE — ED Notes (Signed)
Pt reporting no BM for "about 10 days".  Reports using OTC mediations with no relief.  Abdomen soft at this time.

## 2013-03-02 ENCOUNTER — Encounter (HOSPITAL_COMMUNITY): Payer: Self-pay | Admitting: Cardiology

## 2013-03-02 ENCOUNTER — Observation Stay (HOSPITAL_COMMUNITY): Payer: PRIVATE HEALTH INSURANCE

## 2013-03-02 DIAGNOSIS — I359 Nonrheumatic aortic valve disorder, unspecified: Secondary | ICD-10-CM

## 2013-03-02 DIAGNOSIS — I2589 Other forms of chronic ischemic heart disease: Secondary | ICD-10-CM

## 2013-03-02 DIAGNOSIS — I4892 Unspecified atrial flutter: Secondary | ICD-10-CM

## 2013-03-02 DIAGNOSIS — K5900 Constipation, unspecified: Secondary | ICD-10-CM

## 2013-03-02 LAB — GLUCOSE, CAPILLARY
GLUCOSE-CAPILLARY: 179 mg/dL — AB (ref 70–99)
Glucose-Capillary: 100 mg/dL — ABNORMAL HIGH (ref 70–99)
Glucose-Capillary: 137 mg/dL — ABNORMAL HIGH (ref 70–99)

## 2013-03-02 MED ORDER — METOPROLOL SUCCINATE ER 25 MG PO TB24
25.0000 mg | ORAL_TABLET | Freq: Every day | ORAL | Status: DC
  Administered 2013-03-02: 25 mg via ORAL
  Filled 2013-03-02: qty 1

## 2013-03-02 NOTE — Care Management Note (Addendum)
    Page 1 of 1   03/03/2013     1:47:36 PM   CARE MANAGEMENT NOTE 03/03/2013  Patient:  [name], [name]   Account Number:  [id number]  Date Initiated:  03/02/2013  Documentation initiated by:  Theophilus Kinds  Subjective/Objective Assessment:   Pt admitted from home with a fib. Pt lives with a couple that helps take care of pt and will return home at discharge. Pt is fairly independent with ADL's.     Action/Plan:   No CM needs noted. Pt stated that he does not have any problems with obtaining his medications.   Anticipated DC Date:  03/03/2013   Anticipated DC Plan:  [place]  CM consult      Choice offered to / List presented to:             Status of service:  Completed, signed off Medicare Important Message given?  NA - LOS <3 / Initial given by admissions (If response is "NO", the following Medicare IM given date fields will be blank) Date Medicare IM given:   Date Additional Medicare IM given:    Discharge Disposition:  HOME/SELF CARE  Per UR Regulation:    If discussed at Long Length of Stay Meetings, dates discussed:    Comments:  03/03/13 [employer], RN BSN CM Pt discharged home today. No CM needs noted.  03/02/13 [place], RN BSN CM

## 2013-03-02 NOTE — Progress Notes (Signed)
PROGRESS NOTE  CASTON COOPERSMITH RXV:[id number] DOB: 1945/07/10 DOA: 03/01/2013 PCP: Carolee Rota, NP  Summary: 68 year old man presented to the emergency department with complaint of constipation for 10 days, he subsequently developed atrial flutter with rapid ventricular response there is admitted to step down for further treatment of this as well as constipation.  Assessment/Plan: 1. Atrial flutter with rapid ventricular response, now rate controlled. Restart Toprol-XL, followup echocardiogram results. History of aflutter, maintained on Toprol XL as an outpatient. According to chart not a candidate for anticoagulation secondary to history of fall, poor social situation. 2. Diabetes mellitus. Well controlled. Continue SSI. Resume metformin on discharge. 3. History of coronary artery disease, aortic stenosis 4. Constipation. Result of bowel regimen. Not having diarrhea.    Overall appears to be improving. Cardiology recommendations appreciated. Plan transferred to floor, continue oral Toprol. Possible discharge 3/3. Followup echocardiogram.  Stop bowel regimen. Avoid antidiarrheal agents.  Transfer to telemetry  Code Status: full code DVT prophylaxis: Lovenox Family Communication: none present Disposition Plan:   Murray Hodgkins, MD  [facility]  Pager [phone] If 7PM-7AM, please contact night-coverage at [email or web address], password [facility] 03/02/2013, 7:42 AM  LOS: 1 day   Consultants:  Cardiology  Procedures:  2-D echocardiogram pending  Antibiotics:    HPI/Subjective: Feels much better. No complaints. Several bowel movements. Now having some diarrhea. No chest pain or trouble breathing. Discussed with nursing, diltiazem infusion stopped overnight, heart rate remains controlled.  Objective: Filed Vitals:   03/02/13 0400 03/02/13 0500 03/02/13 0600 03/02/13 0700  BP: 89/61 85/61 85/55  93/70  Pulse: 42 85 68 89  Temp: 97.6 F (36.4 C)     TempSrc: Oral     Resp:  22 27 26 28   Height:      Weight:  71.2 kg (156 lb 15.5 oz)    SpO2: 90% 94% 93% 94%    Intake/Output Summary (Last 24 hours) at 03/02/13 0742 Last data filed at 03/02/13 0600  Gross per 24 hour  Intake    825 ml  Output    275 ml  Net    550 ml     Filed Weights   03/01/13 0909 03/01/13 2000 03/02/13 0500  Weight: 74.8 kg (164 lb 14.5 oz) 72.4 kg (159 lb 9.8 oz) 71.2 kg (156 lb 15.5 oz)    Exam:   Afebrile, SBP 70-90s. No hypoxia. Heart rate 40-80s.  Gen. Appears calm and comfortable. Speech fluent and clear.  Cardiovascular irregular, normal rate. No murmur, rub or gallop. No lower extremity edema.  Respiratory clear to auscultation bilaterally. No wheezes, rales or rhonchi. Normal respiratory effort.  Abdomen soft nontender and nondistended.  Psychiatric grossly normal mood and affect.  Data Reviewed:  Capillary blood sugars stable  Complete metabolic panel unremarkable.  Troponin on admission negative  CBC normal  Abdominal film this morning shows interval clearance of stool in rectal vault  EKG on admission showed recurrent atrial flutter with rapid ventricular response, right bundle branch block old compared to previous study 11/04/2012  Scheduled Meds: . aspirin EC  81 mg Oral Daily  . enoxaparin (LOVENOX) injection  40 mg Subcutaneous Q24H  . insulin aspart  0-9 Units Subcutaneous TID WC  . metoprolol succinate  25 mg Oral Daily  . nicotine  7 mg Transdermal Daily  . polyethylene glycol  17 g Oral Daily   Continuous Infusions: . sodium chloride 10 mL/hr at 03/01/13 1801  . diltiazem (CARDIZEM) infusion Stopped (03/02/13 0137)    Principal Problem:   Atrial  flutter Active Problems:   Diabetes mellitus   Fecal impaction   Unspecified constipation   Time spent 20 minutes

## 2013-03-02 NOTE — Consult Note (Signed)
Primary cardiologist: Dr. Minus Breeding (seen in hospital June 2014) Consulting cardiologist: Dr. Satira Sark  Clinical Summary [name] is a medically complex 68 y.o.male admitted to the hospital with recent constipation and fecal impaction, also noted to be in rapid atrial flutter (known diagnosis). He has not been compliant with cardiology followup. Tells me that he ran out of Toprol-XL, likely the reason that his heart rate was elevated when he presented. This morning he is more comfortable having had a bowel movement with laxative. He denies any chest pain or palpitations. He remains in atrial flutter by telemetry. Was temporarily on intravenous Cardizem however this was discontinued overnight. Blood pressure has been low, although he is asymptomatic.  Records reviewed including hospitalizations over the last 6 months. He has a known history of atypical atrial flutter, felt to be a poor candidate for anticoagulation. He has been treated with heart rate control strategy. Also history of ischemic heart disease as detailed below. Based on chart review, outpatient rate control regimen has included Toprol-XL 50-75 mg daily.   Allergies  Allergen Reactions  . Benadryl [Diphenhydramine Hcl] Other (See Comments)    Hot Flashes "Like Niaspan"    Medications Scheduled Medications: . aspirin EC  81 mg Oral Daily  . enoxaparin (LOVENOX) injection  40 mg Subcutaneous Q24H  . insulin aspart  0-9 Units Subcutaneous TID WC  . metoprolol succinate  25 mg Oral Daily  . nicotine  7 mg Transdermal Daily  . polyethylene glycol  17 g Oral Daily    Infusions: . sodium chloride 10 mL/hr at 03/01/13 1801  . diltiazem (CARDIZEM) infusion Stopped (03/02/13 0137)    PRN Medications: ALPRAZolam, metoprolol, nitroGLYCERIN, oxyCODONE, oxyCODONE-acetaminophen   Past Medical History  Diagnosis Date  . Coronary atherosclerosis of native coronary artery     a. CABG in 2004 - LIMA to LAD, SVG  to diag, SVG to OM, SVG to PDA. EF was 40%. b. Cath 2006 s/p DES to prox RCA.  . Type 2 diabetes mellitus   . Hyperlipidemia   . Essential hypertension, benign   . Atypical atrial flutter     a. Dx 06/2012. b. Not a candidate for anticoag due to fall, tenuous social situation.  . Ischemic cardiomyopathy     a. EF 40% in 2004. b. EF 55% in 06/2012.  Marland Kitchen Aortic stenosis     a. By echo 06/2012, very calcified but mean gradient 43mmHg - will need clinical f/u and serial echoes.  . Low TSH level     a. 06/2012, f/u pcp.  Marland Kitchen Poor social situation   . Cerebral hemorrhage     a. At time of CABG - had post-op encephalopathy and small frontal hemorrhage in 2004.  Marland Kitchen Noncompliance     Past Surgical History  Procedure Laterality Date  . Coronary artery bypass graft      2004    Family History  Problem Relation Age of Onset  . CAD Father 49    Social History Mr. Drost reports that he has been smoking Cigarettes.  He has a 12.5 pack-year smoking history. He has never used smokeless tobacco. Mr. Stucke reports that he does not drink alcohol.  Review of Systems   Physical Examination Blood pressure 92/65, pulse 48, temperature 97.6 F (36.4 C), temperature source Oral, resp. rate 22, height 6\' 3"  (1.905 m), weight 156 lb 15.5 oz (71.2 kg), SpO2 99.00%.  Intake/Output Summary (Last 24 hours) at 03/02/13 0900 Last data filed at 03/02/13  0600  Gross per 24 hour  Intake    825 ml  Output    275 ml  Net    550 ml   Telemetry: Rate control atrial flutter with variable block.  Bearded male, appears comfortable at rest. HEENT: Conjunctiva and lids normal, oropharynx clear with poor dentition. Neck: Supple, no elevated JVP or carotid bruits, no thyromegaly. Lungs: Clear to auscultation, nonlabored breathing at rest. Cardiac: Irregularly irregular, no S3, 2/6 systolic murmur, no pericardial rub. Abdomen: Soft, nontender, bowel sounds present, no guarding or rebound. Extremities: No pitting  edema, distal pulses 2+. Skin: Warm and dry. Musculoskeletal: No kyphosis. Neuropsychiatric: Alert and oriented x3, affect grossly appropriate.   Lab Results  Basic Metabolic Panel:  Recent Labs Lab 03/01/13 0340  NA 141  K 4.1  CL 103  CO2 28  GLUCOSE 98  BUN 18  CREATININE 1.23  CALCIUM 9.4    Liver Function Tests:  Recent Labs Lab 03/01/13 0340  AST 21  ALT 18  ALKPHOS 101  BILITOT 0.6  PROT 7.5  ALBUMIN 3.4*    CBC:  Recent Labs Lab 03/01/13 0340  WBC 7.0  NEUTROABS 4.9  HGB 14.2  HCT 45.0  MCV 84.7  PLT 174    ECG Most recent tracing showed atypical atrial flutter with 2:1 block, right bundle branch block, left anterior fascicular block.  Echocardiogram (June 2014): Study Conclusions  - Left ventricle: Hypokinesis base inferior segment. Study is technically limited. The cavity size was normal. Wall thickness was increased in a pattern of mild LVH. The estimated ejection fraction was 55%. - Aortic valve: The aortic valve is abnormal. It does not open much (unless the left cusp opens better than I can see). Yet the gradients are low. The EF is only mildly reduced. This warrants further consideration. Does the patient clinically fit severe AS with low gradient and preserved EF.? More hemodynamic data may be needed. Mean gradient: 79mm Hg (S). Peak gradient: 11mm Hg (S). - Aorta: Mild dilitation of aortic root. Aortic root dimension: 63mm (ED). - Mitral valve: Mildly calcified annulus. Mild regurgitation. - Left atrium: The atrium was mildly dilated.   Impression  1. Persistent atypical atrial flutter, initially with rapid ventricular response. Suspect this was related to not taking Toprol-XL at home. He states that he ran out of the medication. Records were reviewed. Patient is poor candidate for anticoagulation.  2. Ischemic heart disease status post prior CABG as outlined above. Most recent LVEF 55% as of June 2014.  3. Aortic  stenosis, at least mild, possibly more significant based on suboptimal echocardiogram from June of last year. Repeat studies pending.  4. History of noncompliance.  5. History of hypertension. Current blood pressure or low but coming back to baseline, asymptomatic.   Recommendations  Will initiate Toprol-XL starting at 25 mg daily, can increase from there depending on how he tolerates it and overall blood pressure trend. Followup echocardiogram results. No plans for anticoagulation based on record review.  Satira Sark, M.D., F.A.C.C.

## 2013-03-02 NOTE — Progress Notes (Signed)
Called report to Janan Ridge, Therapist, sports.  Verbalized understanding.  Pt transferred to 314 in safe and stable condition.  [place], Eulis Canner 03/02/2013

## 2013-03-02 NOTE — Progress Notes (Signed)
*  PRELIMINARY RESULTS* Echocardiogram 2D Echocardiogram has been performed.  [place], [place] 03/02/2013, 4:33 PM

## 2013-03-03 LAB — GLUCOSE, CAPILLARY
Glucose-Capillary: 120 mg/dL — ABNORMAL HIGH (ref 70–99)
Glucose-Capillary: 122 mg/dL — ABNORMAL HIGH (ref 70–99)

## 2013-03-03 MED ORDER — METFORMIN HCL 500 MG PO TABS: 500.0000 mg | ORAL_TABLET | Freq: Every day | ORAL | Status: DC

## 2013-03-03 MED ORDER — METOPROLOL SUCCINATE ER 50 MG PO TB24
50.0000 mg | ORAL_TABLET | Freq: Every day | ORAL | Status: DC
  Administered 2013-03-03: 50 mg via ORAL
  Filled 2013-03-03: qty 1

## 2013-03-03 MED ORDER — METOPROLOL SUCCINATE ER 25 MG PO TB24: 12.5000 mg | ORAL_TABLET | Freq: Every day | ORAL | Status: DC

## 2013-03-03 MED ORDER — METOPROLOL SUCCINATE ER 50 MG PO TB24: 50.0000 mg | ORAL_TABLET | Freq: Every day | ORAL | Status: DC

## 2013-03-03 MED ORDER — ATORVASTATIN CALCIUM 40 MG PO TABS: 40.0000 mg | ORAL_TABLET | Freq: Every day | ORAL | Status: DC

## 2013-03-03 NOTE — Discharge Summary (Addendum)
Physician Discharge Summary  [name] [id number] DOB: February 02, 1945 DOA: 03/01/2013  PCP: Carolee Rota, NP  Admit date: 03/01/2013 Discharge date: 03/03/2013  Time spent: 35 minutes  Recommendations for Outpatient Follow-up:  1. Recommend Reinforcing compliance on medications  2. Recommend basic metabolic panel, CBC in about one week 3. Please check TSH as an outpatient while at steady state-his last 1 was depressed at 0.305 06/02/2012 and he might have hyperthyroidism 4. Please check lipid panel in about a week-just check LDL-C  Discharge Diagnoses:  Principal Problem:   Atrial flutter Active Problems:   Diabetes mellitus   Fecal impaction   Unspecified constipation   Discharge Condition: Stable  Diet recommendation: Heart healthy low-salt  Filed Weights   03/01/13 0909 03/01/13 2000 03/02/13 0500  Weight: 74.8 kg (164 lb 14.5 oz) 72.4 kg (159 lb 9.8 oz) 71.2 kg (156 lb 15.5 oz)    History of present illness:  68 year old male, known history CAD, CABG 2004,-complicated by cerebral hemorrhage atypical atrial flutter, ischemic cardiomyopathy-last EF 55% 06/2012, aortic stenosis, type 2 diabetes medical noncompliance with therapy for unknown reason admitted 03/01/13 with fecal impaction Eventually it was found that he had H. fibrillation with RVR and was noncompliant on his blocker-he was initially admitted to the step down unit and was on IV Cardizem Cardiology was consulted-rest as below  Hospital Course:  1. Atrial flutter with rapid ventricular response-initially on IV Cardizem-transitioned to by mouth metoprolol XL 50 mg daily. Because of persisting rates as high as 160 c ambulation, added Toprol XL12.5 pm on d/c home Medication compliance was urged him.  Poor candidate for anticoagulation secondary to falls.  Needs outpatient discussion regarding risk benefits of the same 2. Significant obstipation/constipation-abdominal x-ray 3/2 showed clearance 3. Diabetes  mellitus-moderate control, metformin 500 restriction given on discharge as he has run out. 4. Mod AoS + CAD-will need serial following-cardiology will arrange office visit.  Continue aspirin only 81 mg 5. History of cerebral hemorrhage--see above discussion-caution recommended with history of the same. 6. Hyperlipidemia-patient to continue home dose Crestor 40 daily 7. Tobacco abuse-reinforce compliance with cessation 8.  Probable anxiety-resume Xanax-recommend discontinuation as an outpatient 9. Hypertension-continue amlodipine 10 mg in addition to above meds  Procedures:   echocardiogram 3/2 = moderate LVH EF 55-60% basal inferolateral hypokinesis grade 2 diastolic dysfunction, severe left atrial enlargement, MAC with thick mitral leaflets and mild to moderate regurgitation moderate aortic stenosis mild tricuspid regurg mild to moderate aortic root dilatation  Consultations:   cardiology  Discharge Exam: Filed Vitals:   03/03/13 1136  BP: 102/70  Pulse: 86  Temp:   Resp:    Doing well. Has not ambulated outside of her room as yet   tolerating diet  General:  EOMI, NCAT Cardiovascular:  S1-S2 no murmur or gallop Respiratory:  clear  Discharge Instructions  Discharge Orders   Future Appointments Provider Department Dept Phone   03/10/2013 2:10 PM Lendon Colonel, NP [facility] [place] [phone]   Future Orders Complete By Expires   Diet - low sodium heart healthy  As directed    Discharge instructions  As directed    Comments:     See your nurse Practitioner in 1 week Get your meds filled.  I will give u a Rx for Metformin and Metoprolol.  You were cared for by a hospitalist during your hospital stay. If you have any questions about your discharge medications or the care you received while you were in the hospital after you are discharged,  you can call the unit and asked to speak with the hospitalist on call if the hospitalist that took care of you is not  available. Once you are discharged, your primary care physician will handle any further medical issues. Please note that NO REFILLS for any discharge medications will be authorized once you are discharged, as it is imperative that you return to your primary care physician (or establish a relationship with a primary care physician if you do not have one) for your aftercare needs so that they can reassess your need for medications and monitor your lab values. If you do not have a primary care physician, you can call [phone] for a physician referral.   Increase activity slowly  As directed        Medication List    STOP taking these medications       NITROSTAT 0.4 MG SL tablet  Generic drug:  nitroGLYCERIN      TAKE these medications       ALPRAZolam 1 MG tablet  Commonly known as:  XANAX  Take 1 mg by mouth 4 (four) times daily.     amLODipine 10 MG tablet  Commonly known as:  NORVASC  Take 1 tablet (10 mg total) by mouth daily.     aspirin 81 MG EC tablet  Take 1 tablet (81 mg total) by mouth daily.     metFORMIN 500 MG tablet  Commonly known as:  GLUCOPHAGE  Take 1 tablet (500 mg total) by mouth daily.     metoprolol succinate 25 MG 24 hr tablet  Commonly known as:  TOPROL XL  Take 0.5 tablets (12.5 mg total) by mouth daily at 2 PM daily at 2 PM.     metoprolol succinate 50 MG 24 hr tablet  Commonly known as:  TOPROL-XL  Take 1 tablet (50 mg total) by mouth daily. Take with or immediately following a meal.  Start taking on:  03/04/2013     ondansetron 8 MG disintegrating tablet  Commonly known as:  ZOFRAN-ODT  Take 8 mg by mouth every 8 (eight) hours as needed for nausea.     oxyCODONE-acetaminophen 10-325 MG per tablet  Commonly known as:  PERCOCET  Take 1 tablet by mouth every 4 (four) hours as needed. For pain     rosuvastatin 40 MG tablet  Commonly known as:  CRESTOR  Take 40 mg by mouth daily.       Allergies  Allergen Reactions  . Benadryl [Diphenhydramine  Hcl] Other (See Comments)    Hot Flashes "Like Niaspan"       Follow-up Information   Follow up with Jory Sims, NP On 03/10/2013. (2:10)    Specialty:  Nurse Practitioner   Contact information:   [address] [place] [address] [phone]        The results of significant diagnostics from this hospitalization (including imaging, microbiology, ancillary and laboratory) are listed below for reference.    Significant Diagnostic Studies: Dg Abd 1 View  03/02/2013   CLINICAL DATA:  Constipation  EXAM: ABDOMEN - 1 VIEW  COMPARISON:  DG ABDOMEN 1V dated 03/01/2013; CT CHEST W/CM dated 08/25/2010  FINDINGS: Previous large bone of stool in the rectal month has cleared. Gas is present in the proximal colon without colonic distention noted. No small bowel dilatation is currently evident.  Vascular calcifications appear chronic.  IMPRESSION: 1. Interval clearance of the prominent stool in the rectal vault. 2. Atherosclerosis.   Electronically Signed   By:  Sherryl Barters M.D.   On: 03/02/2013 07:39   Dg Abd 1 View  03/01/2013   CLINICAL DATA:  Constipation, abdominal pain and cramping.  EXAM: ABDOMEN - 1 VIEW  COMPARISON:  Lumbar spine radiographs performed 11/16/2010  FINDINGS: The visualized bowel gas pattern is unremarkable. Scattered air and stool filled loops of colon are seen; no abnormal dilatation of small bowel loops is seen to suggest small bowel obstruction. No free intra-abdominal air is identified, though evaluation for free air is limited on a single supine view.  The rectum is distended with stool, measuring 10.2 cm in transverse dimension, raising concern for fecal impaction.  The visualized osseous structures are within normal limits; the sacroiliac joints are unremarkable in appearance. Scattered vascular calcifications are seen.  IMPRESSION: 1. Rectum distended with stool to 10.2 cm in transverse dimension, raising concern for fecal impaction. 2. Relatively small amount of  stool noted in the remainder of the colon. Unremarkable bowel gas pattern; no free intra-abdominal air seen.   Electronically Signed   By: Garald Balding M.D.   On: 03/01/2013 05:24    Microbiology: Recent Results (from the past 240 hour(s))  MRSA PCR SCREENING     Status: None   Collection Time    03/01/13  6:00 PM      Result Value Ref Range Status   MRSA by PCR NEGATIVE  NEGATIVE Final   Comment:            The GeneXpert MRSA Assay (FDA     approved for NASAL specimens     only), is one component of a     comprehensive MRSA colonization     surveillance program. It is not     intended to diagnose MRSA     infection nor to guide or     monitor treatment for     MRSA infections.     Labs: Basic Metabolic Panel:  Recent Labs Lab 03/01/13 0340  NA 141  K 4.1  CL 103  CO2 28  GLUCOSE 98  BUN 18  CREATININE 1.23  CALCIUM 9.4   Liver Function Tests:  Recent Labs Lab 03/01/13 0340  AST 21  ALT 18  ALKPHOS 101  BILITOT 0.6  PROT 7.5  ALBUMIN 3.4*   No results found for this basename: LIPASE, AMYLASE,  in the last 168 hours No results found for this basename: AMMONIA,  in the last 168 hours CBC:  Recent Labs Lab 03/01/13 0340  WBC 7.0  NEUTROABS 4.9  HGB 14.2  HCT 45.0  MCV 84.7  PLT 174   Cardiac Enzymes: No results found for this basename: CKTOTAL, CKMB, CKMBINDEX, TROPONINI,  in the last 168 hours BNP: BNP (last 3 results)  Recent Labs  06/05/12 0459 11/01/12 0056 11/04/12 2131  PROBNP 4068.0* 7093.0* 6884.0*   CBG:  Recent Labs Lab 03/02/13 0737 03/02/13 1115 03/02/13 1801 03/03/13 0026 03/03/13 1208  GLUCAP 100* 137* 179* 122* 120*       Signed:  Nita Sells  [facility] 03/03/2013, 12:45 PM

## 2013-03-03 NOTE — Progress Notes (Signed)
Patient discharged with instructions given on medications,and follow up appointments,patient verbalized understanding. Prescriptions sent with patient.Accompanied by staff to an awaiting vehicle.

## 2013-03-03 NOTE — Progress Notes (Signed)
Patient ambulated in hallway with heart rate as high as 160's,and lowest rate 124.No c/o pain or discomfort noted.Dr Verlon Au notified.Orders received,and given.

## 2013-03-03 NOTE — Progress Notes (Signed)
Primary cardiologist: Dr. Minus Breeding Consulting cardiologist: Dr. Satira Sark  Subjective:    Anxious to go home.  Objective:   Temp:  [97.6 F (36.4 C)-98.1 F (36.7 C)] 97.6 F (36.4 C) (03/03 0505) Pulse Rate:  [71-100] 87 (03/03 0505) Resp:  [17-30] 18 (03/03 0505) BP: (87-117)/(60-82) 117/82 mmHg (03/03 0505) SpO2:  [96 %-98 %] 96 % (03/03 0505) Last BM Date: 03/02/13  Filed Weights   03/01/13 0909 03/01/13 2000 03/02/13 0500  Weight: 164 lb 14.5 oz (74.8 kg) 159 lb 9.8 oz (72.4 kg) 156 lb 15.5 oz (71.2 kg)    Intake/Output Summary (Last 24 hours) at 03/03/13 1019 Last data filed at 03/03/13 0900  Gross per 24 hour  Intake    740 ml  Output    300 ml  Net    440 ml    Telemetry: Atrial fib rates in the 80's. One episode of RVR at 7 am this morning of 140 bpm.  Exam:  General: No acute distress.  Lungs: Some crackles in the bases. No wheezes.  Cardiac: No elevated JVP or bruits. Irregularly irregular, no gallop or rub.   Extremities: No pitting edema, distal pulses full.   Lab Results:  Basic Metabolic Panel:  Recent Labs Lab 03/01/13 0340  NA 141  K 4.1  CL 103  CO2 28  GLUCOSE 98  BUN 18  CREATININE 1.23  CALCIUM 9.4    Liver Function Tests:  Recent Labs Lab 03/01/13 0340  AST 21  ALT 18  ALKPHOS 101  BILITOT 0.6  PROT 7.5  ALBUMIN 3.4*    CBC:  Recent Labs Lab 03/01/13 0340  WBC 7.0  HGB 14.2  HCT 45.0  MCV 84.7  PLT 174    Echocardiogram: 03/02/2013 Moderate LVH with LVEF 50-55%, basal inferolateral hypokinesis, probable grade 2 diastolic dysfunction. Severe left atrial enlargement. MAC with thickened mitral leaflets and mild to moderate mitral regurgitation. Moderate aortic stenosis as noted above with trivial aortic regurgitation. Moderate RV dilatation with reduced contraction. MIld tricuspid regurgitation with PASP 36 mmHg. Mild to moderate aortic root dilatation.   BNP:  Recent Labs   06/05/12 0459 11/01/12 0056 11/04/12 2131  PROBNP 4068.0* 7093.0* 6884.0*    Radiology: Dg Abd 1 View  03/02/2013   CLINICAL DATA:  Constipation  EXAM: ABDOMEN - 1 VIEW  COMPARISON:  DG ABDOMEN 1V dated 03/01/2013; CT CHEST W/CM dated 08/25/2010  FINDINGS: Previous large bone of stool in the rectal month has cleared. Gas is present in the proximal colon without colonic distention noted. No small bowel dilatation is currently evident.  Vascular calcifications appear chronic.  IMPRESSION: 1. Interval clearance of the prominent stool in the rectal vault. 2. Atherosclerosis.   Electronically Signed   By: Sherryl Barters M.D.   On: 03/02/2013 07:39     Medications:   Scheduled Medications: . aspirin EC  81 mg Oral Daily  . enoxaparin (LOVENOX) injection  40 mg Subcutaneous Q24H  . insulin aspart  0-9 Units Subcutaneous TID WC  . metoprolol succinate  25 mg Oral Daily  . nicotine  7 mg Transdermal Daily    Infusions: . sodium chloride 10 mL/hr at 03/02/13 1200    PRN Medications: ALPRAZolam, nitroGLYCERIN, oxyCODONE, oxyCODONE-acetaminophen   Assessment and Plan:   1. Atrial fib with RVR: Heart rates are better, now on metoprolol succinate 25 mg daily. Would consider increasing  to 50 mg. Continue ASA. Can re-evaluate on OP appt. I have made one for him  on  March 10th in our office for hospital follow-up.   2. Ischemic Heart Disease: Hx of CABG with LVEF 50-55%. Continue risk management with ASA. Statin should be initiated. Lipid study is recommended.   3. Hypertension:  BP is well controlled. Continue to monitor.   4. Moderate aortic valve stenosis  5. Medical Non-compliance: Counseling on follow-up with cardiology and medications. He verbalizes understanding.   Phill Myron. Purcell Nails NP [facility] 03/03/2013, 10:19 AM   Attending note:  Patient seen and examined, modified above note by Ms. Lawrence NP. Heart rate under better control having resumed Toprol-XL. Would  agree with increasing dose to 50 mg daily, continue aspirin. He has a history of noncompliance and remains a poor candidate for anticoagulation. As noted above, aortic valve stenosis is in the moderate range and can be followed for now. We will arrange an office followup visit.  Satira Sark, M.D., F.A.C.C.

## 2013-03-10 ENCOUNTER — Encounter: Payer: PRIVATE HEALTH INSURANCE | Admitting: Adult Health

## 2013-03-10 NOTE — Progress Notes (Signed)
HPI: [name] is a 68 year old patient of Dr. Arley Phenix we are following for ongoing assessment and management of atrial flutter. The patient presented to [facility] with complaints of constipation, he subsequently developed atrial flutter with rapid ventricular response and was admitted to step down care the patient was given Cardizem IV bolus and started on a drip, also given fluid hydration. He was treated for constipation with mag citrate. Other history includes diabetes, CAD, aortic stenosis.   Echocardiogram was completed. This demonstrated LVH with LVEF of 2423%, grade 2 diastolic dysfunction, severe atrial enlargement, MAC with thickened mitral leaflets and mild to moderate mitral regurg. Moderate aortic stenosis. Trivial regurgitant. Moderate RV dilatation with reduced contraction. Mild tricuspid regurg with PAS P. 36 mmHg, mild to moderate root dilatation.    On discharge the patient's heart rates are better controlled and he remained on metoprolol 25 mg daily. Consider increasing to 50 mg daily if heart rate is now well controlled.  Allergies  Allergen Reactions  . Benadryl [Diphenhydramine Hcl] Other (See Comments)    Hot Flashes "Like Niaspan"    Current Outpatient Prescriptions  Medication Sig Dispense Refill  . ALPRAZolam (XANAX) 1 MG tablet Take 1 mg by mouth 4 (four) times daily.      Marland Kitchen amLODipine (NORVASC) 10 MG tablet Take 1 tablet (10 mg total) by mouth daily.  30 tablet  6  . aspirin EC 81 MG EC tablet Take 1 tablet (81 mg total) by mouth daily.      . metFORMIN (GLUCOPHAGE) 500 MG tablet Take 1 tablet (500 mg total) by mouth daily.  30 tablet  0  . metoprolol succinate (TOPROL XL) 25 MG 24 hr tablet Take 0.5 tablets (12.5 mg total) by mouth daily at 2 PM daily at 2 PM.  30 tablet  0  . metoprolol succinate (TOPROL-XL) 50 MG 24 hr tablet Take 1 tablet (50 mg total) by mouth daily. Take with or immediately following a meal.  30 tablet  0  . ondansetron  (ZOFRAN-ODT) 8 MG disintegrating tablet Take 8 mg by mouth every 8 (eight) hours as needed for nausea.       Marland Kitchen oxyCODONE-acetaminophen (PERCOCET) 10-325 MG per tablet Take 1 tablet by mouth every 4 (four) hours as needed. For pain       . rosuvastatin (CRESTOR) 40 MG tablet Take 40 mg by mouth daily.       No current facility-administered medications for this visit.    Past Medical History  Diagnosis Date  . Coronary atherosclerosis of native coronary artery     a. CABG in 2004 - LIMA to LAD, SVG to diag, SVG to OM, SVG to PDA. EF was 40%. b. Cath 2006 s/p DES to prox RCA.  . Type 2 diabetes mellitus   . Hyperlipidemia   . Essential hypertension, benign   . Atypical atrial flutter     a. Dx 06/2012. b. Not a candidate for anticoag due to fall, tenuous social situation.  . Ischemic cardiomyopathy     a. EF 40% in 2004. b. EF 55% in 06/2012.  Marland Kitchen Aortic stenosis     a. By echo 06/2012, very calcified but mean gradient 17mmHg - will need clinical f/u and serial echoes.  . Low TSH level     a. 06/2012, f/u pcp.  Marland Kitchen Poor social situation   . Cerebral hemorrhage     a. At time of CABG - had post-op encephalopathy and small frontal hemorrhage in  2004.  . Noncompliance     Past Surgical History  Procedure Laterality Date  . Coronary artery bypass graft      2004    ROS: PHYSICAL EXAM There were no vitals taken for this visit.  EKG:  ASSESSMENT AND PLAN

## 2013-03-11 ENCOUNTER — Inpatient Hospital Stay (HOSPITAL_COMMUNITY): Payer: PRIVATE HEALTH INSURANCE

## 2013-03-11 ENCOUNTER — Encounter (HOSPITAL_COMMUNITY): Payer: Self-pay | Admitting: Emergency Medicine

## 2013-03-11 ENCOUNTER — Inpatient Hospital Stay (HOSPITAL_COMMUNITY)
Admission: EM | Admit: 2013-03-11 | Discharge: 2013-03-20 | DRG: 064 | Disposition: A | Discharge status: Home Health | Payer: PRIVATE HEALTH INSURANCE | Attending: Family Medicine | Admitting: Family Medicine

## 2013-03-11 ENCOUNTER — Emergency Department (HOSPITAL_COMMUNITY): Payer: PRIVATE HEALTH INSURANCE

## 2013-03-11 DIAGNOSIS — R918 Other nonspecific abnormal finding of lung field: Secondary | ICD-10-CM | POA: Diagnosis present

## 2013-03-11 DIAGNOSIS — N319 Neuromuscular dysfunction of bladder, unspecified: Secondary | ICD-10-CM

## 2013-03-11 DIAGNOSIS — I2510 Atherosclerotic heart disease of native coronary artery without angina pectoris: Secondary | ICD-10-CM | POA: Diagnosis present

## 2013-03-11 DIAGNOSIS — R911 Solitary pulmonary nodule: Secondary | ICD-10-CM

## 2013-03-11 DIAGNOSIS — F17200 Nicotine dependence, unspecified, uncomplicated: Secondary | ICD-10-CM | POA: Diagnosis present

## 2013-03-11 DIAGNOSIS — J9601 Acute respiratory failure with hypoxia: Secondary | ICD-10-CM

## 2013-03-11 DIAGNOSIS — Z9861 Coronary angioplasty status: Secondary | ICD-10-CM | POA: Diagnosis not present

## 2013-03-11 DIAGNOSIS — Z9119 Patient's noncompliance with other medical treatment and regimen: Secondary | ICD-10-CM

## 2013-03-11 DIAGNOSIS — E43 Unspecified severe protein-calorie malnutrition: Secondary | ICD-10-CM | POA: Diagnosis present

## 2013-03-11 DIAGNOSIS — I2589 Other forms of chronic ischemic heart disease: Secondary | ICD-10-CM | POA: Diagnosis present

## 2013-03-11 DIAGNOSIS — I6350 Cerebral infarction due to unspecified occlusion or stenosis of unspecified cerebral artery: Secondary | ICD-10-CM | POA: Diagnosis present

## 2013-03-11 DIAGNOSIS — N401 Enlarged prostate with lower urinary tract symptoms: Secondary | ICD-10-CM | POA: Diagnosis present

## 2013-03-11 DIAGNOSIS — I712 Thoracic aortic aneurysm, without rupture, unspecified: Secondary | ICD-10-CM

## 2013-03-11 DIAGNOSIS — I484 Atypical atrial flutter: Secondary | ICD-10-CM | POA: Diagnosis present

## 2013-03-11 DIAGNOSIS — Z7982 Long term (current) use of aspirin: Secondary | ICD-10-CM

## 2013-03-11 DIAGNOSIS — N138 Other obstructive and reflux uropathy: Secondary | ICD-10-CM | POA: Diagnosis present

## 2013-03-11 DIAGNOSIS — I129 Hypertensive chronic kidney disease with stage 1 through stage 4 chronic kidney disease, or unspecified chronic kidney disease: Secondary | ICD-10-CM | POA: Diagnosis present

## 2013-03-11 DIAGNOSIS — E785 Hyperlipidemia, unspecified: Secondary | ICD-10-CM | POA: Diagnosis present

## 2013-03-11 DIAGNOSIS — E870 Hyperosmolality and hypernatremia: Secondary | ICD-10-CM | POA: Diagnosis present

## 2013-03-11 DIAGNOSIS — Z91199 Patient's noncompliance with other medical treatment and regimen due to unspecified reason: Secondary | ICD-10-CM | POA: Diagnosis not present

## 2013-03-11 DIAGNOSIS — I4510 Unspecified right bundle-branch block: Secondary | ICD-10-CM | POA: Diagnosis present

## 2013-03-11 DIAGNOSIS — R5381 Other malaise: Secondary | ICD-10-CM | POA: Diagnosis present

## 2013-03-11 DIAGNOSIS — J441 Chronic obstructive pulmonary disease with (acute) exacerbation: Secondary | ICD-10-CM

## 2013-03-11 DIAGNOSIS — Z720 Tobacco use: Secondary | ICD-10-CM

## 2013-03-11 DIAGNOSIS — R339 Retention of urine, unspecified: Secondary | ICD-10-CM | POA: Diagnosis present

## 2013-03-11 DIAGNOSIS — Z951 Presence of aortocoronary bypass graft: Secondary | ICD-10-CM | POA: Diagnosis not present

## 2013-03-11 DIAGNOSIS — N179 Acute kidney failure, unspecified: Secondary | ICD-10-CM | POA: Diagnosis present

## 2013-03-11 DIAGNOSIS — N320 Bladder-neck obstruction: Secondary | ICD-10-CM | POA: Diagnosis present

## 2013-03-11 DIAGNOSIS — G8190 Hemiplegia, unspecified affecting unspecified side: Secondary | ICD-10-CM | POA: Diagnosis present

## 2013-03-11 DIAGNOSIS — I639 Cerebral infarction, unspecified: Secondary | ICD-10-CM | POA: Diagnosis present

## 2013-03-11 DIAGNOSIS — I4892 Unspecified atrial flutter: Secondary | ICD-10-CM

## 2013-03-11 DIAGNOSIS — Z8249 Family history of ischemic heart disease and other diseases of the circulatory system: Secondary | ICD-10-CM | POA: Diagnosis not present

## 2013-03-11 DIAGNOSIS — IMO0002 Reserved for concepts with insufficient information to code with codable children: Secondary | ICD-10-CM | POA: Diagnosis not present

## 2013-03-11 DIAGNOSIS — E279 Disorder of adrenal gland, unspecified: Secondary | ICD-10-CM | POA: Diagnosis present

## 2013-03-11 DIAGNOSIS — I4891 Unspecified atrial fibrillation: Secondary | ICD-10-CM | POA: Diagnosis present

## 2013-03-11 DIAGNOSIS — E278 Other specified disorders of adrenal gland: Secondary | ICD-10-CM

## 2013-03-11 DIAGNOSIS — E119 Type 2 diabetes mellitus without complications: Secondary | ICD-10-CM

## 2013-03-11 DIAGNOSIS — I509 Heart failure, unspecified: Secondary | ICD-10-CM | POA: Diagnosis present

## 2013-03-11 DIAGNOSIS — N1330 Unspecified hydronephrosis: Secondary | ICD-10-CM | POA: Diagnosis present

## 2013-03-11 DIAGNOSIS — R5383 Other fatigue: Secondary | ICD-10-CM | POA: Diagnosis present

## 2013-03-11 DIAGNOSIS — I350 Nonrheumatic aortic (valve) stenosis: Secondary | ICD-10-CM

## 2013-03-11 DIAGNOSIS — N189 Chronic kidney disease, unspecified: Secondary | ICD-10-CM | POA: Diagnosis present

## 2013-03-11 DIAGNOSIS — R4701 Aphasia: Secondary | ICD-10-CM | POA: Diagnosis present

## 2013-03-11 DIAGNOSIS — I10 Essential (primary) hypertension: Secondary | ICD-10-CM

## 2013-03-11 LAB — DIFFERENTIAL
BASOS PCT: 0 % (ref 0–1)
Basophils Absolute: 0 10*3/uL (ref 0.0–0.1)
Eosinophils Absolute: 0.1 10*3/uL (ref 0.0–0.7)
Eosinophils Relative: 1 % (ref 0–5)
LYMPHS ABS: 1 10*3/uL (ref 0.7–4.0)
Lymphocytes Relative: 14 % (ref 12–46)
Monocytes Absolute: 0.6 10*3/uL (ref 0.1–1.0)
Monocytes Relative: 9 % (ref 3–12)
NEUTROS PCT: 76 % (ref 43–77)
Neutro Abs: 5.4 10*3/uL (ref 1.7–7.7)

## 2013-03-11 LAB — CBC
HCT: 43.9 % (ref 39.0–52.0)
HEMOGLOBIN: 14.3 g/dL (ref 13.0–17.0)
MCH: 26.9 pg (ref 26.0–34.0)
MCHC: 32.6 g/dL (ref 30.0–36.0)
MCV: 82.7 fL (ref 78.0–100.0)
PLATELETS: 170 10*3/uL (ref 150–400)
RBC: 5.31 MIL/uL (ref 4.22–5.81)
RDW: 16.1 % — ABNORMAL HIGH (ref 11.5–15.5)
WBC: 7.1 10*3/uL (ref 4.0–10.5)

## 2013-03-11 LAB — CBG MONITORING, ED: GLUCOSE-CAPILLARY: 168 mg/dL — AB (ref 70–99)

## 2013-03-11 LAB — COMPREHENSIVE METABOLIC PANEL
ALBUMIN: 3.5 g/dL (ref 3.5–5.2)
ALT: 10 U/L (ref 0–53)
AST: 12 U/L (ref 0–37)
Alkaline Phosphatase: 96 U/L (ref 39–117)
BILIRUBIN TOTAL: 0.6 mg/dL (ref 0.3–1.2)
BUN: 16 mg/dL (ref 6–23)
CO2: 23 mEq/L (ref 19–32)
Calcium: 9.5 mg/dL (ref 8.4–10.5)
Chloride: 102 mEq/L (ref 96–112)
Creatinine, Ser: 1.1 mg/dL (ref 0.50–1.35)
GFR calc Af Amer: 77 mL/min — ABNORMAL LOW (ref >90)
GFR calc non Af Amer: 67 mL/min — ABNORMAL LOW (ref >90)
GLUCOSE: 174 mg/dL — AB (ref 70–99)
POTASSIUM: 3.9 meq/L (ref 3.7–5.3)
Sodium: 139 mEq/L (ref 137–147)
TOTAL PROTEIN: 7.5 g/dL (ref 6.0–8.3)

## 2013-03-11 LAB — HEMOGLOBIN A1C
Hgb A1c MFr Bld: 6.6 % — ABNORMAL HIGH (ref <5.7)
Mean Plasma Glucose: 143 mg/dL — ABNORMAL HIGH (ref <117)

## 2013-03-11 LAB — MRSA PCR SCREENING: MRSA by PCR: NEGATIVE

## 2013-03-11 LAB — GLUCOSE, CAPILLARY
GLUCOSE-CAPILLARY: 128 mg/dL — AB (ref 70–99)
GLUCOSE-CAPILLARY: 120 mg/dL — AB (ref 70–99)
Glucose-Capillary: 98 mg/dL (ref 70–99)
Glucose-Capillary: 100 mg/dL — ABNORMAL HIGH (ref 70–99)

## 2013-03-11 LAB — LIPID PANEL
CHOL/HDL RATIO: 2.5 ratio
CHOLESTEROL: 86 mg/dL (ref 0–200)
HDL: 35 mg/dL — AB (ref >39)
LDL Cholesterol: 42 mg/dL (ref 0–99)
Triglycerides: 47 mg/dL (ref <150)
VLDL: 9 mg/dL (ref 0–40)

## 2013-03-11 LAB — PROTIME-INR
INR: 1.12 (ref 0.00–1.49)
Prothrombin Time: 14.2 seconds (ref 11.6–15.2)

## 2013-03-11 LAB — ETHANOL: Alcohol, Ethyl (B): <11 mg/dL (ref 0–11)

## 2013-03-11 LAB — APTT: aPTT: 30 seconds (ref 24–37)

## 2013-03-11 LAB — TROPONIN I

## 2013-03-11 LAB — LACTIC ACID, PLASMA: Lactic Acid, Venous: 1.5 mmol/L (ref 0.5–2.2)

## 2013-03-11 MED ORDER — SODIUM CHLORIDE 0.9 % IV SOLN
INTRAVENOUS | Status: DC
  Administered 2013-03-11: 05:00:00 via INTRAVENOUS

## 2013-03-11 MED ORDER — ATORVASTATIN CALCIUM 40 MG PO TABS
80.0000 mg | ORAL_TABLET | Freq: Every day | ORAL | Status: DC
  Administered 2013-03-11 – 2013-03-19 (×8): 80 mg via ORAL
  Filled 2013-03-11 (×9): qty 2

## 2013-03-11 MED ORDER — INSULIN ASPART 100 UNIT/ML ~~LOC~~ SOLN
0.0000 [IU] | Freq: Three times a day (TID) | SUBCUTANEOUS | Status: DC
  Administered 2013-03-11: 1 [IU] via SUBCUTANEOUS
  Administered 2013-03-12: 2 [IU] via SUBCUTANEOUS
  Administered 2013-03-12 – 2013-03-13 (×2): 1 [IU] via SUBCUTANEOUS
  Administered 2013-03-13: 2 [IU] via SUBCUTANEOUS
  Administered 2013-03-14: 1 [IU] via SUBCUTANEOUS
  Administered 2013-03-14: 2 [IU] via SUBCUTANEOUS
  Administered 2013-03-15: 3 [IU] via SUBCUTANEOUS
  Administered 2013-03-15: 2 [IU] via SUBCUTANEOUS
  Administered 2013-03-15 – 2013-03-16 (×2): 1 [IU] via SUBCUTANEOUS
  Administered 2013-03-16: 2 [IU] via SUBCUTANEOUS
  Administered 2013-03-17: 3 [IU] via SUBCUTANEOUS
  Administered 2013-03-17 (×2): 2 [IU] via SUBCUTANEOUS
  Administered 2013-03-18: 1 [IU] via SUBCUTANEOUS
  Administered 2013-03-18: 3 [IU] via SUBCUTANEOUS
  Administered 2013-03-19 (×3): 2 [IU] via SUBCUTANEOUS
  Administered 2013-03-20: 3 [IU] via SUBCUTANEOUS
  Administered 2013-03-20: 2 [IU] via SUBCUTANEOUS

## 2013-03-11 MED ORDER — DILTIAZEM HCL 100 MG IV SOLR
5.0000 mg/h | INTRAVENOUS | Status: DC
  Administered 2013-03-11 (×2): 5 mg/h via INTRAVENOUS
  Administered 2013-03-12 – 2013-03-13 (×4): 15 mg/h via INTRAVENOUS
  Filled 2013-03-11: qty 100

## 2013-03-11 MED ORDER — ASPIRIN 300 MG RE SUPP
300.0000 mg | Freq: Once | RECTAL | Status: AC
  Administered 2013-03-11: 300 mg via RECTAL

## 2013-03-11 MED ORDER — ASPIRIN 300 MG RE SUPP
300.0000 mg | Freq: Every day | RECTAL | Status: DC
  Filled 2013-03-11 (×7): qty 1

## 2013-03-11 MED ORDER — ENOXAPARIN SODIUM 40 MG/0.4ML ~~LOC~~ SOLN
40.0000 mg | SUBCUTANEOUS | Status: DC
  Administered 2013-03-11 – 2013-03-12 (×2): 40 mg via SUBCUTANEOUS
  Filled 2013-03-11 (×2): qty 0.4

## 2013-03-11 MED ORDER — SODIUM CHLORIDE 0.9 % IV SOLN: INTRAVENOUS | Status: AC

## 2013-03-11 MED ORDER — ASPIRIN 325 MG PO TABS: 325.0000 mg | ORAL_TABLET | Freq: Once | ORAL | Status: DC

## 2013-03-11 MED ORDER — ASPIRIN 325 MG PO TABS
325.0000 mg | ORAL_TABLET | Freq: Every day | ORAL | Status: DC
  Administered 2013-03-11 – 2013-03-20 (×10): 325 mg via ORAL
  Filled 2013-03-11 (×11): qty 1

## 2013-03-11 MED ORDER — DILTIAZEM LOAD VIA INFUSION
15.0000 mg | Freq: Once | INTRAVENOUS | Status: AC
  Administered 2013-03-11: 15 mg via INTRAVENOUS
  Filled 2013-03-11: qty 15

## 2013-03-11 NOTE — Progress Notes (Signed)
[facility] PROGRESS NOTE  [name] PYP:[id number] DOB: 1945/05/20 DOA: 03/11/2013 PCP: Carolee Rota, NP  Assessment/Plan: Acute stroke Patient has bilateral stroke on head CT and is aphasic with some right sided weakness on exam. Continue to monitor in ICU. MRI of the brain , MRA head pending. Patient recently had a 2-D echo showed low normal EF with hypokinesis of basal inferior lateral myocardium and grade 2 diastolic dysfunction.. Given multiple areas of infarct may need TEE but since he is not a candidate for any anticoagulation or intervention with hx of non compliance will hold off on TEE. -Continue with full dose aspirin. -Carotid Doppler showing extensive amount of bilateral atherosclerotic plaque. Will obtain CT angio of the neck. - neurology consult pending. -Continue Lipitor -swallow, speech , PT/ OT eval. -currently NPO  A. fib with RVR Patient started on Cardizem drip which is continued. Monitor in ICU.  Diabetes mellitus Check A1c. Continue on sliding scale insulin A1C of 6.6  Hypertension Allow permissive blood pressure  - Code Status: full Family Communication: None at bedside. Unable to reach his sister on the phone no in the system  Disposition Plan: Pending   Consultants:  neurology  Procedures:  None  Antibiotics:  None  HPI/Subjective: Patient seen and examined this morning. He is quite aphesic and responds to simple commands only.  Objective: Filed Vitals:   03/11/13 1300  BP: 119/76  Pulse: 79  Temp:   Resp: 28   No intake or output data in the 24 hours ending 03/11/13 1809 Filed Weights   03/11/13 0209 03/11/13 0424  Weight: 70.761 kg (156 lb) 72.2 kg (159 lb 2.8 oz)    Exam:   General:  Elderly male lying in bed in no acute distress  HEENT: No pallor, moist oral mucosa,  Chest: Bilateral breath sounds bilaterally  Cardiovascular: S1 and S2 irregularly irregular  Abdomen: Soft, nontender, nondistended,  bowel sounds present  Musculoskeletal: Warm, no edema  CNS: aphasic, responds to simple commands, right extremity 4/5 power  Data Reviewed: Basic Metabolic Panel:  Recent Labs Lab 03/11/13 0135  NA 139  K 3.9  CL 102  CO2 23  GLUCOSE 174*  BUN 16  CREATININE 1.10  CALCIUM 9.5   Liver Function Tests:  Recent Labs Lab 03/11/13 0135  AST 12  ALT 10  ALKPHOS 96  BILITOT 0.6  PROT 7.5  ALBUMIN 3.5   No results found for this basename: LIPASE, AMYLASE,  in the last 168 hours No results found for this basename: AMMONIA,  in the last 168 hours CBC:  Recent Labs Lab 03/11/13 0135  WBC 7.1  NEUTROABS 5.4  HGB 14.3  HCT 43.9  MCV 82.7  PLT 170   Cardiac Enzymes:  Recent Labs Lab 03/11/13 0135  TROPONINI <0.30   BNP (last 3 results)  Recent Labs  06/05/12 0459 11/01/12 0056 11/04/12 2131  PROBNP 4068.0* 7093.0* 6884.0*   CBG:  Recent Labs Lab 03/11/13 0139 03/11/13 0745 03/11/13 1110 03/11/13 1634  GLUCAP 168* 98 100* 128*    Recent Results (from the past 240 hour(s))  CULTURE, BLOOD (ROUTINE X 2)     Status: None   Collection Time    03/11/13  2:13 AM      Result Value Ref Range Status   Specimen Description BLOOD RIGHT ANTECUBITAL   Final   Special Requests     Final   Value: BOTTLES DRAWN AEROBIC AND ANAEROBIC AEB 4CC ANA 6CC   Culture NO GROWTH <24 HRS  Final   Report Status PENDING   Incomplete  CULTURE, BLOOD (ROUTINE X 2)     Status: None   Collection Time    03/11/13  2:13 AM      Result Value Ref Range Status   Specimen Description BLOOD RIGHT HAND   Final   Special Requests BOTTLES DRAWN AEROBIC AND ANAEROBIC 5CC EACH   Final   Culture NO GROWTH <24 HRS   Final   Report Status PENDING   Incomplete  MRSA PCR SCREENING     Status: None   Collection Time    03/11/13  4:16 AM      Result Value Ref Range Status   MRSA by PCR NEGATIVE  NEGATIVE Final   Comment:            The GeneXpert MRSA Assay (FDA     approved for NASAL  specimens     only), is one component of a     comprehensive MRSA colonization     surveillance program. It is not     intended to diagnose MRSA     infection nor to guide or     monitor treatment for     MRSA infections.     Studies: Dg Chest 1 View  03/11/2013   CLINICAL DATA Right extremity weakness.  EXAM CHEST - 1 VIEW  COMPARISON November 04, 2012.  FINDINGS Stable cardiomegaly. Status post coronary artery bypass graft. Old right rib fractures are noted. No pneumothorax or pleural effusion is noted. No acute pulmonary disease is noted.  IMPRESSION No acute cardiopulmonary abnormality seen.  SIGNATURE  Electronically Signed   By: Sabino Dick M.D.   On: 03/11/2013 02:44   Ct Head Wo Contrast  03/11/2013   CLINICAL DATA Right arm weakness, expressive aphasia  EXAM CT HEAD WITHOUT CONTRAST  TECHNIQUE Contiguous axial images were obtained from the base of the skull through the vertex without intravenous contrast.  COMPARISON 06/02/2012  FINDINGS Subtle left basal ganglia hypodensity and loss of left insular ribbon series 2, image 18. Right coronal radiata hypodensity is similar and may reflect sequelae of a prior lacunar infarction. Multiple other scattered areas of subcortical and periventricular white matter hypodensity may reflect chronic microangiopathic change. No intraparenchymal hemorrhage, mass, mass effect, or abnormal extra-axial fluid collection. No hydrocephalus. Atherosclerotic vascular calcifications. Paranasal sinuses and mastoid air cells are predominantly clear.  IMPRESSION Left basal ganglia hypodensity and loss of left insular ribbon may reflect an acute infarction.  Right corona radiata remote lacunar infarction and multifocal white matter hypodensities, similar to prior.  Critical Value/emergent results were called by telephone at the time of interpretation on 03/11/2013 at 1:59 AM to Dr. Jola Schmidt , who verbally acknowledged these results.  SIGNATURE  Electronically Signed    By: Carlos Levering M.D.   On: 03/11/2013 02:02   US Carotid Duplex Bilateral  03/11/2013   CLINICAL DATA CVA, hypertension  EXAM BILATERAL CAROTID DUPLEX ULTRASOUND  TECHNIQUE Gray scale imaging, color Doppler and duplex ultrasound were performed of bilateral carotid and vertebral arteries in the neck.  COMPARISON CT HEAD W/O CM dated 03/11/2013  FINDINGS Criteria: Quantification of carotid stenosis is based on velocity parameters that correlate the residual internal carotid diameter with NASCET-based stenosis levels, using the diameter of the distal internal carotid lumen as the denominator for stenosis measurement.  The following velocity measurements were obtained:  RIGHT  ICA:  109/33 cm/sec  CCA:  [id number] cm/sec  SYSTOLIC ICA/CCA RATIO:  2.0  DIASTOLIC  ICA/CCA RATIO:  3.4  ECA:  72 cm/sec  LEFT  ICA:  59/14 cm/sec  CCA:  03/55 cm/sec  SYSTOLIC ICA/CCA RATIO:  1.1  DIASTOLIC ICA/CCA RATIO:  1.1  ECA:  112 cm/sec  RIGHT CAROTID ARTERY: There is rather large amount of the eccentric echogenic partially shadowing plaque within the mid aspect of the right common carotid artery (representative image 7) extending to involve the distal aspects of the right common carotid artery (image 11). There is eccentric echogenic partially shadowing plaque within the right carotid bulb (images 14 and 16) extending to involve the origin and proximal aspect of the right internal carotid artery (image 24), not definitely resulting in elevated peak systolic velocities within the interrogated course of the right internal carotid artery to suggest a hemodynamically significant stenosis.  RIGHT VERTEBRAL ARTERY:  Antegrade flow  LEFT CAROTID ARTERY: There is eccentric echogenic partially shadowing plaque within the mid and distal aspects of the left common carotid artery (representative images 42, 45 and 46). There is eccentric mixed echogenic partially shadowing plaque within the left carotid bulb (images 50 and 52), extending to  involve the origin and proximal aspect of the left internal carotid artery (image 61), not resulting in elevated peak systolic velocities within the interrogated course of the left internal carotid artery to suggest a hemodynamically significant stenosis.  LEFT VERTEBRAL ARTERY:  Antegrade flow  IMPRESSION Rather extensive amount of irregular atherosclerotic plaque bilaterally, not definitely resulting in hemodynamically significant stenosis though could serve as a source of distal embolism. Further evaluation could be performed with CTA as clinically indicated.  SIGNATURE  Electronically Signed   By: Sandi Mariscal M.D.   On: 03/11/2013 16:55    Scheduled Meds: . sodium chloride   Intravenous STAT  . aspirin  300 mg Rectal Daily   Or  . aspirin  325 mg Oral Daily  . atorvastatin  80 mg Oral q1800  . enoxaparin (LOVENOX) injection  40 mg Subcutaneous Q24H  . insulin aspart  0-9 Units Subcutaneous TID WC   Continuous Infusions: . sodium chloride 75 mL/hr at 03/11/13 0507  . diltiazem (CARDIZEM) infusion 5 mg/hr (03/11/13 0249)      Time spent: 35 minutes    Ellsworth Waldschmidt, [facility] Pager [phone] If 7PM-7AM, please contact night-coverage at [email or web address], password [facility] 03/11/2013, 6:09 PM  LOS: 0 days

## 2013-03-11 NOTE — Evaluation (Signed)
Clinical/Bedside Swallow Evaluation Patient Details  Name: [name] MRN: [id number] Date of Birth: Mar 30, 1945  Today's Date: 03/11/2013 Time: 1250-1300 SLP Time Calculation (min): 10 min  Past Medical History:  Past Medical History  Diagnosis Date  . Coronary atherosclerosis of native coronary artery     a. CABG in 2004 - LIMA to LAD, SVG to diag, SVG to OM, SVG to PDA. EF was 40%. b. Cath 2006 s/p DES to prox RCA.  . Type 2 diabetes mellitus   . Hyperlipidemia   . Essential hypertension, benign   . Atypical atrial flutter     a. Dx 06/2012. b. Not a candidate for anticoag due to fall, tenuous social situation.  . Ischemic cardiomyopathy     a. EF 40% in 2004. b. EF 55% in 06/2012.  Marland Kitchen Aortic stenosis     a. By echo 06/2012, very calcified but mean gradient 71mmHg - will need clinical f/u and serial echoes.  . Low TSH level     a. 06/2012, f/u pcp.  Marland Kitchen Poor social situation   . Cerebral hemorrhage     a. At time of CABG - had post-op encephalopathy and small frontal hemorrhage in 2004.  Marland Kitchen Noncompliance    Past Surgical History:  Past Surgical History  Procedure Laterality Date  . Coronary artery bypass graft      2004   HPI:  [name] to the hospital with a left basal ganglia infarct.  He has a hx of right brain stroke, CAD, DM, HTN.  He lives with a roommate and I have no information as to prior functional status.  At the time of admission he displayed right sided weakness, expressive aphasia and left gaze preference.    Assessment / Plan / Recommendation Clinical Impression       Aspiration Risk  Mild    Diet Recommendation Regular;Nectar-thick liquid   Liquid Administration via: Cup Medication Administration: Whole meds with puree Supervision: Staff to assist with self feeding;Intermittent supervision to cue for compensatory strategies Compensations: Slow rate;Small sips/bites Postural Changes and/or Swallow Maneuvers: Seated upright 90 degrees;Upright  30-60 min after meal    Other  Recommendations Oral Care Recommendations: Oral care BID   Follow Up Recommendations       Frequency and Duration min 2x/week  1 week       SLP Swallow Goals     Swallow Study Prior Functional Status       General Date of Onset: 03/11/13 HPI: [name] to the hospital with a left basal ganglia infarct.  He has a hx of right brain stroke, CAD, DM, HTN.  He lives with a roommate and I have no information as to prior functional status.  At the time of admission he displayed right sided weakness, expressive aphasia and left gaze preference.  Type of Study: Bedside swallow evaluation Diet Prior to this Study: NPO Behavior/Cognition: Alert;Cooperative;Other (comment) (nonverbal) Oral Cavity - Dentition: Missing dentition Self-Feeding Abilities: Needs assist;Able to feed self Patient Positioning: Upright in chair Baseline Vocal Quality: Other (comment) (Pt was nonverbal, but was able to use gestures to communicate. ) Volitional Cough: Congested Volitional Swallow: Able to elicit    Oral/Motor/Sensory Function Labial ROM: Reduced right   Ice Chips Ice chips: Within functional limits   Thin Liquid Thin Liquid: Impaired Presentation: Cup;Spoon;Straw Pharyngeal  Phase Impairments: Cough - Delayed    Nectar Thick Nectar Thick Liquid: Within functional limits Presentation: Cup   Honey Thick Honey Thick Liquid: Not tested  Puree Puree: Within functional limits Presentation: Spoon   Solid   GO    Solid: Within functional limits Presentation: Self Fed       [name] S 03/11/2013,5:28 PM

## 2013-03-11 NOTE — ED Notes (Signed)
Patient's roommate left name and number to call if needed. Name is Liam Graham, number [phone].

## 2013-03-11 NOTE — Evaluation (Signed)
Occupational Therapy Evaluation Patient Details Name: [name] MRN: [id number] DOB: [birth year] Today's Date: 03/11/2013 Time: 7353-2992 OT Time Calculation (min): 26 min Eval 26'  OT Assessment / Plan / Recommendation History of present illness [name] to the hospital with a left basal ganglia infarct.  He has a hx of right brain stroke, CAD, DM, HTN.  He lives with a roommate and I have no information as to prior functional status.  At the time of admission he displayed right sided weakness, expressive aphasia and left gaze preference.   Clinical Impression   Pt is presenting to acute OT with decreased coordination and response time. Pt demonstrates good physical abilites with ADL tasks, but requires significant prompting (verbal or tacttile) to complete tasks. Pt is curenlty responding with mostly headnods to questions, but did in one instance shake his head no appropriately to a question and in another instance appropriately laughed quietly to a comment by this therapist.  Pt's prior function is difficult to determine at this point in time, but further evaluation will be made. Attempted to complete the clock drawing test and pt was only able to draw multiple, small circles on the paper, beginning at the right side and progressing to the left.  Further OT services will be needed, and further evaluation required.    OT Assessment  Patient needs continued OT Services    Follow Up Recommendations   (to be determined, based on home situation and pt's progress)    Barriers to Discharge  (TBD)    Equipment Recommendations       Recommendations for Other Services    Frequency  Min 3X/week    Precautions / Restrictions Precautions Precautions: Fall Restrictions Weight Bearing Restrictions: No       ADL  Eating/Feeding: Set up Grooming: Brushing hair;Minimal assistance ADL Comments: Pt is demonstrating physical abilites to preform ADL tasks, just a delayed time level. Pt  did have x1 instance of holding comb backwards against head and did not seem to understand request to comb hair (tapping comb, rather than using it to brush), but with hand-ovr-had assist corrected. Pt was able to manipulate TV control to adjust volume to his preference with no prompting and x1instacne  hitting wrong button but self correcting. Pt currenlty requires assist for all ADL tasks due to current state of cognition.    OT Diagnosis: Cognitive deficits;Other (comment);Hemiplegia dominant side  OT Problem List: Decreased coordination;Decreased cognition;Impaired sensation;Decreased activity tolerance;Other (comment) (Decreased response time) OT Treatment Interventions: Self-care/ADL training;Therapeutic exercise;Neuromuscular education;Energy conservation;DME and/or AE instruction;Manual therapy;Modalities;Therapeutic activities;Cognitive remediation/compensation;Visual/perceptual remediation/compensation;Patient/family education   OT Goals(Current goals can be found in the care plan section) Acute Rehab OT Goals Patient Stated Goal: none indicated OT Goal Formulation: With patient Time For Goal Achievement: 03/25/13 Potential to Achieve Goals: Fair ADL Goals Pt Will Perform Grooming: with set-up Pt/caregiver will Perform Home Exercise Program: Right Upper extremity (for improved coordination) Additional ADL Goal #1: Pt will have further testing into his potential  vision and sensation deficits  Visit Information  Last OT Received On: 03/11/13 History of Present Illness: [name] to the hospital with a left basal ganglia infarct.  He has a hx of right brain stroke, CAD, DM, HTN.  He lives with a roommate and I have no information as to prior functional status.  At the time of admission he displayed right sided weakness, expressive aphasia and left gaze preference.       Prior [place]  Living Family/patient expects to be discharged to::  (Unable to determine at this  time) Living Arrangements: Non-relatives/Friends Prior Function Level of Independence:  (Unknown at this time - previous eval from [facility] within past year states that pt was independent with assistive devices) Communication Communication: Expressive difficulties (Difficulty following some commands) Dominant Hand: Right         Vision/Perception Vision - History Baseline Vision: Wears glasses only for reading (Pt able to shake head 'no' when asked if he wears glasses all the time) Patient Visual Report:  (Pt nodded when asked if vision has changed - further testing to be done.) Vision - Assessment Additional Comments: Pt has accepptable visual tracking, but has dificulty maintaining gaze to the right. Further testing to be done.   Cognition  Cognition Arousal/Alertness: Awake/alert Behavior During Therapy: WFL for tasks assessed/performed Overall Cognitive Status: Within Functional Limits for tasks assessed (Slow response time potentially different from baseline)    Extremity/Trunk Assessment Upper Extremity Assessment Upper Extremity Assessment: Overall WFL for tasks assessed;RUE deficits/detail (Pt had difficulty with commands, but exhibited 4+/5 in both sides with continued prompting/testing. pt has decreased grip strength. Unable to determine sensation at this time due to decreased communication status.) RUE Deficits / Details: Pt is currenlty expressing delayed responses in right side, but strength and AROm is [facility]. Sensation is unknown at this time. RUE Coordination: decreased fine motor;decreased gross motor (delayed) Lower Extremity Assessment Lower Extremity Assessment: Defer to PT evaluation              End of Session OT - End of Session Activity Tolerance: Patient tolerated treatment well Patient left: in chair  [place], [place], OTR/L [phone]  03/11/2013, 5:04 PM

## 2013-03-11 NOTE — ED Notes (Signed)
Patient unable to control saliva.

## 2013-03-11 NOTE — Evaluation (Signed)
Physical Therapy Evaluation Patient Details Name: [name] MRN: [id number] DOB: [birth year] Today's Date: 03/11/2013 Time: 8101-7510 PT Time Calculation (min): 41 min  PT Assessment / Plan / Recommendation History of Present Illness  [name] to the hospital with a left basal ganglia infarct.  He has a hx of right brain stroke, CAD, DM, HTN.  He lives with a roommate and I have no information as to prior functional status.  At the time of admission he displayed right sided weakness, expressive aphasia and left gaze preference.  Clinical Impression   Pt was seen for evaluation.  He was alert and very cooperative, continuing to have left gaze preference and had no speech at all.  He had difficulty following directions at times and so it is difficult to know if this is actually receptive aphasia or lack of focus.  He did have weakness of the RLE as compared to the LLE but had good bed mobility and was able to transfer to standing with no problem.  We were extremely limited in doing any gait due to IV lines but he was unable to step in place at EOB even with a walker.  He did indicate that he uses a walker at home. (He tends to nod his head "yes" whenever asked a question).  Because I could not assess gait, it is not yet possible to determine discharge needs.  Hopefully we will be able to do this tomorrow.    PT Assessment  Patient needs continued PT services    Follow Up Recommendations   (unable to determine at this time)       Barriers to Discharge Decreased caregiver support pt lives with a roommate    Equipment Recommendations  None recommended by PT         Frequency Min 6X/week    Precautions / Restrictions Precautions Precautions: Fall Restrictions Weight Bearing Restrictions: No         Mobility  Bed Mobility Overal bed mobility: Modified Independent Transfers Overall transfer level: Needs assistance Equipment used: None Transfers: Sit to/from Stand Sit  to Stand: Supervision Ambulation/Gait General Gait Details: we are unable to evaluate gait due to the constraints of the ICU setting,( IV lines are not portable and are very short)    Exercises     PT Diagnosis: Difficulty walking;Hemiplegia dominant side  PT Problem List: Decreased strength;Decreased activity tolerance;Decreased balance;Decreased mobility PT Treatment Interventions: Gait training;Functional mobility training;Therapeutic exercise;Therapeutic activities     PT Goals(Current goals can be found in the care plan section) Acute Rehab PT Goals Patient Stated Goal: none indicated PT Goal Formulation: With patient Time For Goal Achievement: 03/25/13 Potential to Achieve Goals: Good  Visit Information  Last PT Received On: 03/11/13 History of Present Illness: [name] to the hospital with a left basal ganglia infarct.  He has a hx of right brain stroke, CAD, DM, HTN.  He lives with a roommate and I have no information as to prior functional status.  At the time of admission he displayed right sided weakness, expressive aphasia and left gaze preference.       Prior Functioning  Home Living Family/patient expects to be discharged to::  (unable to determine at this time) Living Arrangements: Non-relatives/Friends Prior Function Comments: pt is not able to give any information Communication Communication: Expressive difficulties Dominant Hand: Right    Cognition  Cognition Arousal/Alertness: Awake/alert Behavior During Therapy: WFL for tasks assessed/performed Overall Cognitive Status: Within Functional Limits for tasks assessed  Extremity/Trunk Assessment Lower Extremity Assessment Lower Extremity Assessment: RLE deficits/detail RLE Deficits / Details: pt has antigravity strength of the RLE however strenght is about 1 grade less than LLE ...strength is generally 3/5   Balance Balance Overall balance assessment: Needs assistance Sitting-balance support: No  upper extremity supported;Feet supported Sitting balance-Leahy Scale: Normal Standing balance support: No upper extremity supported Standing balance-Leahy Scale: Fair Standing balance comment: pt is unable to off load weight from the RLE in order to step in place although he should have the strength to do so...therefore, there is probably a balance deficit present but I am unable to document it at this time  End of Session PT - End of Session Equipment Utilized During Treatment: Gait belt Activity Tolerance: Patient tolerated treatment well Patient left: in chair;with call bell/phone within reach Nurse Communication: Mobility status  GP     [name] 03/11/2013, 10:45 AM

## 2013-03-11 NOTE — ED Notes (Signed)
Patient brought in by sons stating patient is unable to move right arm and leg and unable to answer questions. States this was noticed at 0100 tonight. Last seen normal approximately 2300 tonight.

## 2013-03-11 NOTE — ED Provider Notes (Signed)
CSN: [id number]     Arrival date & time 03/11/13  0127 History   First MD Initiated Contact with Patient 03/11/13 [phone]     Chief Complaint  Patient presents with  . Extremity Weakness     Level V caveat: Expressive aphasia  The history is provided by a relative.   patient was brought to the emergency department by family because of an inability to speak and move his right arm.  Family reports that the patient was last seen well at approximately 1045 to 11 PM tonight.  There was some shortly after and it sounds as though the patient was unable to speak and unable to move his right arm.  He was noted to be wearing a jacket long sleeved T-shirt in the house with a heater point on them.  No recent illness.  He stated is otherwise in his normal state of health earlier today.  Review the records demonstrate a recent hospitalization for atrial flutter and fibrillation.  The patient was not placed on anticoagulation during his last hospitalization because of fall risk.  No reported vomiting    Past Medical History  Diagnosis Date  . Coronary atherosclerosis of native coronary artery     a. CABG in 2004 - LIMA to LAD, SVG to diag, SVG to OM, SVG to PDA. EF was 40%. b. Cath 2006 s/p DES to prox RCA.  . Type 2 diabetes mellitus   . Hyperlipidemia   . Essential hypertension, benign   . Atypical atrial flutter     a. Dx 06/2012. b. Not a candidate for anticoag due to fall, tenuous social situation.  . Ischemic cardiomyopathy     a. EF 40% in 2004. b. EF 55% in 06/2012.  Marland Kitchen Aortic stenosis     a. By echo 06/2012, very calcified but mean gradient 66mmHg - will need clinical f/u and serial echoes.  . Low TSH level     a. 06/2012, f/u pcp.  Marland Kitchen Poor social situation   . Cerebral hemorrhage     a. At time of CABG - had post-op encephalopathy and small frontal hemorrhage in 2004.  Marland Kitchen Noncompliance    Past Surgical History  Procedure Laterality Date  . Coronary artery bypass graft      2004   Family  History  Problem Relation Age of Onset  . CAD Father 63   History  Substance Use Topics  . Smoking status: Current Every Day Smoker -- 0.25 packs/day for 50 years    Types: Cigarettes  . Smokeless tobacco: Never Used  . Alcohol Use: No     Comment: Pt states he has not drank in 2 years    Review of Systems  Unable to perform ROS     Allergies  Benadryl  Home Medications   Current Outpatient Rx  Name  Route  Sig  Dispense  Refill  . ALPRAZolam (XANAX) 1 MG tablet   Oral   Take 1 mg by mouth 4 (four) times daily.         Marland Kitchen amLODipine (NORVASC) 10 MG tablet   Oral   Take 1 tablet (10 mg total) by mouth daily.   30 tablet   6   . aspirin EC 81 MG EC tablet   Oral   Take 1 tablet (81 mg total) by mouth daily.         . metFORMIN (GLUCOPHAGE) 500 MG tablet   Oral   Take 1 tablet (500 mg total) by mouth daily.  30 tablet   0   . metoprolol succinate (TOPROL XL) 25 MG 24 hr tablet   Oral   Take 0.5 tablets (12.5 mg total) by mouth daily at 2 PM daily at 2 PM.   30 tablet   0   . metoprolol succinate (TOPROL-XL) 50 MG 24 hr tablet   Oral   Take 1 tablet (50 mg total) by mouth daily. Take with or immediately following a meal.   30 tablet   0   . ondansetron (ZOFRAN-ODT) 8 MG disintegrating tablet   Oral   Take 8 mg by mouth every 8 (eight) hours as needed for nausea.          Marland Kitchen oxyCODONE-acetaminophen (PERCOCET) 10-325 MG per tablet   Oral   Take 1 tablet by mouth every 4 (four) hours as needed. For pain          . rosuvastatin (CRESTOR) 40 MG tablet   Oral   Take 40 mg by mouth daily.          BP 118/90  Pulse 119  Resp 27  Ht 5\' 10"  (1.778 m)  Wt 156 lb (70.761 kg)  BMI 22.38 kg/m2  SpO2 98% Physical Exam  Nursing note and vitals reviewed. Constitutional: He appears well-developed and well-nourished.  HENT:  Head: Normocephalic and atraumatic.  Eyes: EOM are normal.  Neck: Normal range of motion.  Cardiovascular: Normal rate,  regular rhythm, normal heart sounds and intact distal pulses.   Pulmonary/Chest: Effort normal and breath sounds normal. No respiratory distress.  Abdominal: Soft. He exhibits no distension. There is no tenderness.  Musculoskeletal: Normal range of motion.  Neurological: He is alert.  Right-sided neglect.  Left gaze preference.  Expressive aphasia.  Follows commands.  Pronator drift on the right upper extremity.  Mild weakness of his right lower chimney as compared to his left.  Skin: Skin is warm and dry.  Psychiatric: He has a normal mood and affect. Judgment normal.    ED Course  Procedures (including critical care time)  CRITICAL CARE Performed by: Hoy Morn Total critical care time: 32 Critical care time was exclusive of separately billable procedures and treating other patients. Critical care was necessary to treat or prevent imminent or life-threatening deterioration. Critical care was time spent personally by me on the following activities: development of treatment plan with patient and/or surrogate as well as nursing, discussions with consultants, evaluation of patient's response to treatment, examination of patient, obtaining history from patient or surrogate, ordering and performing treatments and interventions, ordering and review of laboratory studies, ordering and review of radiographic studies, pulse oximetry and re-evaluation of patient's condition.  Labs Review Labs Reviewed  CBC - Abnormal; Notable for the following:    RDW 16.1 (*)    All other components within normal limits  COMPREHENSIVE METABOLIC PANEL - Abnormal; Notable for the following:    Glucose, Bld 174 (*)    GFR calc non Af Amer 67 (*)    GFR calc Af Amer 77 (*)    All other components within normal limits  CBG MONITORING, ED - Abnormal; Notable for the following:    Glucose-Capillary 168 (*)    All other components within normal limits  CULTURE, BLOOD (ROUTINE X 2)  CULTURE, BLOOD (ROUTINE X 2)   URINE CULTURE  ETHANOL  PROTIME-INR  APTT  DIFFERENTIAL  TROPONIN I  LACTIC ACID, PLASMA  URINE RAPID DRUG SCREEN (HOSP PERFORMED)  URINALYSIS, ROUTINE W REFLEX MICROSCOPIC   Imaging Review  Ct Head Wo Contrast  03/11/2013   CLINICAL DATA Right arm weakness, expressive aphasia  EXAM CT HEAD WITHOUT CONTRAST  TECHNIQUE Contiguous axial images were obtained from the base of the skull through the vertex without intravenous contrast.  COMPARISON 06/02/2012  FINDINGS Subtle left basal ganglia hypodensity and loss of left insular ribbon series 2, image 18. Right coronal radiata hypodensity is similar and may reflect sequelae of a prior lacunar infarction. Multiple other scattered areas of subcortical and periventricular white matter hypodensity may reflect chronic microangiopathic change. No intraparenchymal hemorrhage, mass, mass effect, or abnormal extra-axial fluid collection. No hydrocephalus. Atherosclerotic vascular calcifications. Paranasal sinuses and mastoid air cells are predominantly clear.  IMPRESSION Left basal ganglia hypodensity and loss of left insular ribbon may reflect an acute infarction.  Right corona radiata remote lacunar infarction and multifocal white matter hypodensities, similar to prior.  Critical Value/emergent results were called by telephone at the time of interpretation on 03/11/2013 at 1:59 AM to Dr. Jola Schmidt , who verbally acknowledged these results.  SIGNATURE  Electronically Signed   By: Carlos Levering M.D.   On: 03/11/2013 02:02  I personally reviewed the imaging tests through PACS system I reviewed available ER/hospitalization records through the EMR Discussed findings with radiologist   EKG Interpretation   Date/Time:  Wednesday March 11 2013 01:41:49 EDT Ventricular Rate:  117 PR Interval:    QRS Duration: 134 QT Interval:  376 QTC Calculation: 524 R Axis:   -62 Text Interpretation:  Atrial fibrillation with rapid ventricular response  Left axis  deviation Right bundle branch block Abnormal ECG When compared  with ECG of 01-Mar-2013 04:55, Atrial fibrillation has replaced Atrial  flutter Nonspecific T wave abnormality no longer evident in Lateral leads  Confirmed by Nakyah Erdmann  MD, Jadesola Poynter ([id number]) on 03/11/2013 2:04:54 AM      MDM   Final diagnoses:  Stroke  Atrial fibrillation with rapid ventricular response    Code stroke on arrival. Spoke with teleneurology during the assesment who recommends ASA. Not a candidate for TPA, given head bleed in 2004. Out of stroke window as well as LNW time was closer to 830-9pm. Spoke with stroke center neurologist at [facility], Dr Leonel Ramsay who believes will not be a good candidate for directed therapy by interventional neurologist as it will likely near 8 hours until his groin can be accessed for intervention by the time head CTA is performed and transported to [place]. Recommends ASA. Rectal ASA given. Likely embolic from aflutter/fib. Admit to hospital. Neurology consultation in AM. Neuro is not recommending heparin at this tim. Rectal temp normal. BP [id number] systolic. Started on cardizem. Please see tele neurology consultation note for complete details    Hoy Morn, MD 03/11/13 [phone]

## 2013-03-11 NOTE — H&P (Addendum)
PCP:   DANIEL,JANICE, NP   Chief Complaint:  Weakness  HPI: 68 year old male who   has a past medical history of Coronary atherosclerosis of native coronary artery; Type 2 diabetes mellitus; Hyperlipidemia; Essential hypertension, benign; Atypical atrial flutter; Ischemic cardiomyopathy; Aortic stenosis; Low TSH level; Poor social situation; Cerebral hemorrhage; and Noncompliance. Today presented to the ED with chief complaint of weakness of right side and inability to speak. Patient was recently discharged from the hospital on March 3 after he was treated for fecal impaction, patient also has a history of atrial flutter but was discharged on aspirin and no anticoagulation as per cardiology due to history of noncompliance in the past. Patient lives with a roommate, whose wife found the patient in the room with sweating and right-sided weakness, and inability to speak. Patient was found normal around 8:30 to 9 PM last night. In the ED, CT scan of the head was done which showed CVA. [facility] was consulted by the ED physician, who did not recommend TPA given head bleed in 2004. Also patient was out of stroke would do as was found normal around 8:30/9 PM. Patient also was not found to be a good candidate for direct therapy by interventional neurologist. Aspirin was recommended which was given rectally to the patient. Patient also found to be in atrial fibrillation with RVR, and was started on Cardizem GTT. As the patient's roommate, patient did not complain of any recent symptoms including nausea vomiting or diarrhea, no chest pain or shortness of breath. There was no seizure activity noted.  Allergies:   Allergies  Allergen Reactions  . Benadryl [Diphenhydramine Hcl] Other (See Comments)    Hot Flashes "Like Niaspan"      Past Medical History  Diagnosis Date  . Coronary atherosclerosis of native coronary artery     a. CABG in 2004 - LIMA to LAD, SVG to diag, SVG to OM, SVG to PDA. EF  was 40%. b. Cath 2006 s/p DES to prox RCA.  . Type 2 diabetes mellitus   . Hyperlipidemia   . Essential hypertension, benign   . Atypical atrial flutter     a. Dx 06/2012. b. Not a candidate for anticoag due to fall, tenuous social situation.  . Ischemic cardiomyopathy     a. EF 40% in 2004. b. EF 55% in 06/2012.  Marland Kitchen Aortic stenosis     a. By echo 06/2012, very calcified but mean gradient 64mHg - will need clinical f/u and serial echoes.  . Low TSH level     a. 06/2012, f/u pcp.  .Marland KitchenPoor social situation   . Cerebral hemorrhage     a. At time of CABG - had post-op encephalopathy and small frontal hemorrhage in 2004.  .Marland KitchenNoncompliance     Past Surgical History  Procedure Laterality Date  . Coronary artery bypass graft      2004    Prior to Admission medications   Medication Sig Start Date End Date Taking? Authorizing Provider  ALPRAZolam (Duanne Moron 1 MG tablet Take 1 mg by mouth 4 (four) times daily.    Historical Provider, MD  amLODipine (NORVASC) 10 MG tablet Take 1 tablet (10 mg total) by mouth daily. 06/04/12   Dayna N Dunn, PA-C  aspirin EC 81 MG EC tablet Take 1 tablet (81 mg total) by mouth daily. 11/02/12   DSamuella Cota MD  metFORMIN (GLUCOPHAGE) 500 MG tablet Take 1 tablet (500 mg total) by mouth daily. 03/03/13   JNita Sells  MD  metoprolol succinate (TOPROL XL) 25 MG 24 hr tablet Take 0.5 tablets (12.5 mg total) by mouth daily at 2 PM daily at 2 PM. 03/03/13   Nita Sells, MD  metoprolol succinate (TOPROL-XL) 50 MG 24 hr tablet Take 1 tablet (50 mg total) by mouth daily. Take with or immediately following a meal. 03/04/13   Nita Sells, MD  ondansetron (ZOFRAN-ODT) 8 MG disintegrating tablet Take 8 mg by mouth every 8 (eight) hours as needed for nausea.  08/29/12   Historical Provider, MD  oxyCODONE-acetaminophen (PERCOCET) 10-325 MG per tablet Take 1 tablet by mouth every 4 (four) hours as needed. For pain     Historical Provider, MD  rosuvastatin (CRESTOR) 40  MG tablet Take 40 mg by mouth daily.    Historical Provider, MD    Social History:  reports that he has been smoking Cigarettes.  He has a 12.5 pack-year smoking history. He has never used smokeless tobacco. He reports that he does not drink alcohol or use illicit drugs.  Family History  Problem Relation Age of Onset  . CAD Father 97       Review of Systems:  Unable to obtain except in history of present illness   Physical Exam: Blood pressure 115/84, pulse 98, temperature 98.3 F (36.8 C), temperature source Rectal, resp. rate 27, height 5' 10" (1.778 m), weight 70.761 kg (156 lb), SpO2 99.00%. Constitutional:   Patient is a well-developed and well-nourished male in no acute distress and cooperative with exam. Head: Normocephalic and atraumatic Mouth: Mucus membranes moist Eyes: PERRL, EOMI, conjunctivae normal Neck: Supple, No Thyromegaly Cardiovascular: RRR, S1 normal, S2 normal Pulmonary/Chest: CTAB, no wheezes, rales, or rhonchi Abdominal: Soft. Non-tender, non-distended, bowel sounds are normal, no masses, organomegaly, or guarding present.  Neurological: Alert, follows commands, motor strength 1/5 in right upper extremity, 2/5 in right lower extremity, positive Babinski. Motor strength 5 of 5 in left upper and left lower extremity.  patient is nonverbal, has neglect on right side Extremities : No Cyanosis, Clubbing or Edema   Labs on Admission:  Results for orders placed during the hospital encounter of 03/11/13 (from the past 48 hour(s))  ETHANOL     Status: None   Collection Time    03/11/13  1:35 AM      Result Value Ref Range   Alcohol, Ethyl (B) <11  0 - 11 mg/dL   Comment:            LOWEST DETECTABLE LIMIT FOR     SERUM ALCOHOL IS 11 mg/dL     FOR MEDICAL PURPOSES ONLY  PROTIME-INR     Status: None   Collection Time    03/11/13  1:35 AM      Result Value Ref Range   Prothrombin Time 14.2  11.6 - 15.2 seconds   INR 1.12  0.00 - 1.49  APTT     Status:  None   Collection Time    03/11/13  1:35 AM      Result Value Ref Range   aPTT 30  24 - 37 seconds  CBC     Status: Abnormal   Collection Time    03/11/13  1:35 AM      Result Value Ref Range   WBC 7.1  4.0 - 10.5 K/uL   RBC 5.31  4.22 - 5.81 MIL/uL   Hemoglobin 14.3  13.0 - 17.0 g/dL   HCT 43.9  39.0 - 52.0 %   MCV 82.7  78.0 -  100.0 fL   MCH 26.9  26.0 - 34.0 pg   MCHC 32.6  30.0 - 36.0 g/dL   RDW 16.1 (*) 11.5 - 15.5 %   Platelets 170  150 - 400 K/uL  DIFFERENTIAL     Status: None   Collection Time    03/11/13  1:35 AM      Result Value Ref Range   Neutrophils Relative % 76  43 - 77 %   Neutro Abs 5.4  1.7 - 7.7 K/uL   Lymphocytes Relative 14  12 - 46 %   Lymphs Abs 1.0  0.7 - 4.0 K/uL   Monocytes Relative 9  3 - 12 %   Monocytes Absolute 0.6  0.1 - 1.0 K/uL   Eosinophils Relative 1  0 - 5 %   Eosinophils Absolute 0.1  0.0 - 0.7 K/uL   Basophils Relative 0  0 - 1 %   Basophils Absolute 0.0  0.0 - 0.1 K/uL  COMPREHENSIVE METABOLIC PANEL     Status: Abnormal   Collection Time    03/11/13  1:35 AM      Result Value Ref Range   Sodium 139  137 - 147 mEq/L   Potassium 3.9  3.7 - 5.3 mEq/L   Chloride 102  96 - 112 mEq/L   CO2 23  19 - 32 mEq/L   Glucose, Bld 174 (*) 70 - 99 mg/dL   BUN 16  6 - 23 mg/dL   Creatinine, Ser 1.10  0.50 - 1.35 mg/dL   Calcium 9.5  8.4 - 10.5 mg/dL   Total Protein 7.5  6.0 - 8.3 g/dL   Albumin 3.5  3.5 - 5.2 g/dL   AST 12  0 - 37 U/L   ALT 10  0 - 53 U/L   Alkaline Phosphatase 96  39 - 117 U/L   Total Bilirubin 0.6  0.3 - 1.2 mg/dL   GFR calc non Af Amer 67 (*) >90 mL/min   GFR calc Af Amer 77 (*) >90 mL/min   Comment: (NOTE)     The eGFR has been calculated using the CKD EPI equation.     This calculation has not been validated in all clinical situations.     eGFR's persistently <90 mL/min signify possible Chronic Kidney     Disease.  TROPONIN I     Status: None   Collection Time    03/11/13  1:35 AM      Result Value Ref Range    Troponin I <0.30  <0.30 ng/mL   Comment:            Due to the release kinetics of cTnI,     a negative result within the first hours     of the onset of symptoms does not rule out     myocardial infarction with certainty.     If myocardial infarction is still suspected,     repeat the test at appropriate intervals.  CBG MONITORING, ED     Status: Abnormal   Collection Time    03/11/13  1:39 AM      Result Value Ref Range   Glucose-Capillary 168 (*) 70 - 99 mg/dL  CULTURE, BLOOD (ROUTINE X 2)     Status: None   Collection Time    03/11/13  2:13 AM      Result Value Ref Range   Specimen Description BLOOD RIGHT ANTECUBITAL     Special Requests       Value:  BOTTLES DRAWN AEROBIC AND ANAEROBIC AEB 4CC ANA [place]   Culture PENDING     Report Status PENDING    CULTURE, BLOOD (ROUTINE X 2)     Status: None   Collection Time    03/11/13  2:13 AM      Result Value Ref Range   Specimen Description BLOOD RIGHT HAND     Special Requests BOTTLES DRAWN AEROBIC AND ANAEROBIC 5CC EACH     Culture PENDING     Report Status PENDING    LACTIC ACID, PLASMA     Status: None   Collection Time    03/11/13  2:13 AM      Result Value Ref Range   Lactic Acid, Venous 1.5  0.5 - 2.2 mmol/L    Radiological Exams on Admission: Dg Chest 1 View  03/11/2013   CLINICAL DATA Right extremity weakness.  EXAM CHEST - 1 VIEW  COMPARISON November 04, 2012.  FINDINGS Stable cardiomegaly. Status post coronary artery bypass graft. Old right rib fractures are noted. No pneumothorax or pleural effusion is noted. No acute pulmonary disease is noted.  IMPRESSION No acute cardiopulmonary abnormality seen.  SIGNATURE  Electronically Signed   By: Sabino Dick M.D.   On: 03/11/2013 02:44   Ct Head Wo Contrast  03/11/2013   CLINICAL DATA Right arm weakness, expressive aphasia  EXAM CT HEAD WITHOUT CONTRAST  TECHNIQUE Contiguous axial images were obtained from the base of the skull through the vertex without intravenous contrast.   COMPARISON 06/02/2012  FINDINGS Subtle left basal ganglia hypodensity and loss of left insular ribbon series 2, image 18. Right coronal radiata hypodensity is similar and may reflect sequelae of a prior lacunar infarction. Multiple other scattered areas of subcortical and periventricular white matter hypodensity may reflect chronic microangiopathic change. No intraparenchymal hemorrhage, mass, mass effect, or abnormal extra-axial fluid collection. No hydrocephalus. Atherosclerotic vascular calcifications. Paranasal sinuses and mastoid air cells are predominantly clear.  IMPRESSION Left basal ganglia hypodensity and loss of left insular ribbon may reflect an acute infarction.  Right corona radiata remote lacunar infarction and multifocal white matter hypodensities, similar to prior.  Critical Value/emergent results were called by telephone at the time of interpretation on 03/11/2013 at 1:59 AM to Dr. Jola Schmidt , who verbally acknowledged these results.  SIGNATURE  Electronically Signed   By: Carlos Levering M.D.   On: 03/11/2013 02:02    Assessment/Plan Principal Problem:   Stroke Active Problems:   Hypertension   Atypical atrial flutter   Diabetes mellitus   CVA (cerebral infarction)  Stroke We'll admit the patient to step down, and initiate stroke protocol Will get neurology consultation in the morning We'll obtain MRI/MRA of the brain, carotid ultrasound, echocardiogram. Patient will be continued on aspirin suppository 300 mg daily. Patient is on Crestor 40 mg daily which will be continued. We'll obtain PT/OT consult, follow speech therapy evaluation as bedside swallow eval could not be completed due to patient's high risk of aspiration  Diabetes mellitus We'll start sliding scale insulin, patient is n.p.o. Will hold metformin at this time  Atrial fibrillation with RVR Patient found to be in atrial flutter, we'll continue Cardizem GTT 5 mg per hour Continue aspirin, patient not a  candidate for anticoagulation.  Hypertension Will hold metoprolol, Norvasc at this time for permissive hypertension  Code status: Patient is full code  Family discussion: Discussed with patient's roommate at bedside   Time Spent on Admission: 52 minutes  [facility] Pager: [phone]  03/11/2013, 3:21 AM  If 7PM-7AM, please contact night-coverage  [email or web address]  Password [email or web address]

## 2013-03-11 NOTE — Consult Note (Signed)
[place] A. Merlene Laughter, MD     [email or web address]          [name] is an 68 y.o. male.   ASSESSMENT/PLAN:  1. 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 The etiology is undoubtedly due to cardioembolic phenomenon from atrial fibrillation. The patient unfortunately has been noncompliant with medical visits and anticoagulation previously. This can be reassessed at a later date. In meantime we can continue with antiplatelet agent. A TEE is unlikely to change patient management and therefore I do not recommend this.    The patient is a 68 year old white male who was found by his relatives on the morning of the 10th to be confused and not speaking. He may have been noted to have mild right-sided weakness. Patient was last known to be normal the night before. The patient does have a history of atrial fibrillation but has been noncompliant with a lot of his medical care. He apparently was taking aspirin however. The patient is otherwise unable to provide any history because of the severe global aphasia.  GENERAL: He is in no acute distress.  HEENT: Supple. Atraumatic normocephalic.   ABDOMEN: soft  EXTREMITIES: No edema   BACK: Normal.  SKIN: Normal by inspection.    MENTAL STATUS: He is awake and alert. He is noted to have a severe aphasia with the essentially no verbal output. He does tends to mimic a midline commands but clearly does not follow either axial or appendicular commands consistently.  CRANIAL NERVES: Pupils are equal, round and reactive to light and accommodation; extra ocular movements are full, there is no significant nystagmus; visual fields appears show a right homonymous hemianopia on confrontational bedside testing; upper and lower facial muscles are normal in strength and symmetric, there is no flattening of the nasolabial folds.  MOTOR: There appears to be a downward drift on the  right side and mild weakness about 4/5. The left side shows normal tone, bulk and strength.  COORDINATION: No dysmetrias are observed. There are no tremors.  REFLEXES: Deep tendon reflexes are symmetrical and normal. Babinski reflexes are flexor bilaterally.   SENSATION: This is unreliable.   Past Medical History  Diagnosis Date  . Coronary atherosclerosis of native coronary artery     a. CABG in 2004 - LIMA to LAD, SVG to diag, SVG to OM, SVG to PDA. EF was 40%. b. Cath 2006 s/p DES to prox RCA.  . Type 2 diabetes mellitus   . Hyperlipidemia   . Essential hypertension, benign   . Atypical atrial flutter     a. Dx 06/2012. b. Not a candidate for anticoag due to fall, tenuous social situation.  . Ischemic cardiomyopathy     a. EF 40% in 2004. b. EF 55% in 06/2012.  Marland Kitchen Aortic stenosis     a. By echo 06/2012, very calcified but mean gradient 97mmHg - will need clinical f/u and serial echoes.  . Low TSH level     a. 06/2012, f/u pcp.  Marland Kitchen Poor social situation   . Cerebral hemorrhage     a. At time of CABG - had post-op encephalopathy and small frontal hemorrhage in 2004.  Marland Kitchen Noncompliance     Past Surgical History  Procedure Laterality Date  . Coronary artery bypass graft      2004    Family History  Problem Relation Age of Onset  . CAD Father 67    Social History:  reports that  he has been smoking Cigarettes.  He has a 12.5 pack-year smoking history. He has never used smokeless tobacco. He reports that he does not drink alcohol or use illicit drugs.  Allergies:  Allergies  Allergen Reactions  . Benadryl [Diphenhydramine Hcl] Other (See Comments)    Hot Flashes "Like Niaspan"    Medications: Prior to Admission medications   Medication Sig Start Date End Date Taking? Authorizing Provider  ALPRAZolam Duanne Moron) 1 MG tablet Take 1 mg by mouth 4 (four) times daily.    Historical Provider, MD  amLODipine (NORVASC) 10 MG tablet Take 1 tablet (10 mg total) by mouth daily. 06/04/12    Dayna N Dunn, PA-C  aspirin EC 81 MG EC tablet Take 1 tablet (81 mg total) by mouth daily. 11/02/12   Samuella Cota, MD  metFORMIN (GLUCOPHAGE) 500 MG tablet Take 1 tablet (500 mg total) by mouth daily. 03/03/13   Nita Sells, MD  metoprolol succinate (TOPROL XL) 25 MG 24 hr tablet Take 0.5 tablets (12.5 mg total) by mouth daily at 2 PM daily at 2 PM. 03/03/13   Nita Sells, MD  metoprolol succinate (TOPROL-XL) 50 MG 24 hr tablet Take 1 tablet (50 mg total) by mouth daily. Take with or immediately following a meal. 03/04/13   Nita Sells, MD  ondansetron (ZOFRAN-ODT) 8 MG disintegrating tablet Take 8 mg by mouth every 8 (eight) hours as needed for nausea.  08/29/12   Historical Provider, MD  oxyCODONE-acetaminophen (PERCOCET) 10-325 MG per tablet Take 1 tablet by mouth every 4 (four) hours as needed. For pain     Historical Provider, MD  rosuvastatin (CRESTOR) 40 MG tablet Take 40 mg by mouth daily.    Historical Provider, MD    Scheduled Meds: . sodium chloride   Intravenous STAT  . aspirin  300 mg Rectal Daily   Or  . aspirin  325 mg Oral Daily  . atorvastatin  80 mg Oral q1800  . enoxaparin (LOVENOX) injection  40 mg Subcutaneous Q24H  . insulin aspart  0-9 Units Subcutaneous TID WC   Continuous Infusions: . sodium chloride 75 mL/hr at 03/11/13 0507  . diltiazem (CARDIZEM) infusion 5 mg/hr (03/11/13 0249)   PRN Meds:.   Blood pressure 111/82, pulse 87, temperature 98.3 F (36.8 C), temperature source Oral, resp. rate 24, height $RemoveBe'5\' 10"'[id number]$  (1.778 m), weight 72.2 kg (159 lb 2.8 oz), SpO2 94.00%.   Results for orders placed during the hospital encounter of 03/11/13 (from the past 48 hour(s))  ETHANOL     Status: None   Collection Time    03/11/13  1:35 AM      Result Value Ref Range   Alcohol, Ethyl (B) <11  0 - 11 mg/dL   Comment:            LOWEST DETECTABLE LIMIT FOR     SERUM ALCOHOL IS 11 mg/dL     FOR MEDICAL PURPOSES ONLY  PROTIME-INR     Status: None    Collection Time    03/11/13  1:35 AM      Result Value Ref Range   Prothrombin Time 14.2  11.6 - 15.2 seconds   INR 1.12  0.00 - 1.49  APTT     Status: None   Collection Time    03/11/13  1:35 AM      Result Value Ref Range   aPTT 30  24 - 37 seconds  CBC     Status: Abnormal   Collection Time  03/11/13  1:35 AM      Result Value Ref Range   WBC 7.1  4.0 - 10.5 K/uL   RBC 5.31  4.22 - 5.81 MIL/uL   Hemoglobin 14.3  13.0 - 17.0 g/dL   HCT 43.9  39.0 - 52.0 %   MCV 82.7  78.0 - 100.0 fL   MCH 26.9  26.0 - 34.0 pg   MCHC 32.6  30.0 - 36.0 g/dL   RDW 16.1 (*) 11.5 - 15.5 %   Platelets 170  150 - 400 K/uL  DIFFERENTIAL     Status: None   Collection Time    03/11/13  1:35 AM      Result Value Ref Range   Neutrophils Relative % 76  43 - 77 %   Neutro Abs 5.4  1.7 - 7.7 K/uL   Lymphocytes Relative 14  12 - 46 %   Lymphs Abs 1.0  0.7 - 4.0 K/uL   Monocytes Relative 9  3 - 12 %   Monocytes Absolute 0.6  0.1 - 1.0 K/uL   Eosinophils Relative 1  0 - 5 %   Eosinophils Absolute 0.1  0.0 - 0.7 K/uL   Basophils Relative 0  0 - 1 %   Basophils Absolute 0.0  0.0 - 0.1 K/uL  COMPREHENSIVE METABOLIC PANEL     Status: Abnormal   Collection Time    03/11/13  1:35 AM      Result Value Ref Range   Sodium 139  137 - 147 mEq/L   Potassium 3.9  3.7 - 5.3 mEq/L   Chloride 102  96 - 112 mEq/L   CO2 23  19 - 32 mEq/L   Glucose, Bld 174 (*) 70 - 99 mg/dL   BUN 16  6 - 23 mg/dL   Creatinine, Ser 1.10  0.50 - 1.35 mg/dL   Calcium 9.5  8.4 - 10.5 mg/dL   Total Protein 7.5  6.0 - 8.3 g/dL   Albumin 3.5  3.5 - 5.2 g/dL   AST 12  0 - 37 U/L   ALT 10  0 - 53 U/L   Alkaline Phosphatase 96  39 - 117 U/L   Total Bilirubin 0.6  0.3 - 1.2 mg/dL   GFR calc non Af Amer 67 (*) >90 mL/min   GFR calc Af Amer 77 (*) >90 mL/min   Comment: (NOTE)     The eGFR has been calculated using the CKD EPI equation.     This calculation has not been validated in all clinical situations.     eGFR's persistently <90  mL/min signify possible Chronic Kidney     Disease.  TROPONIN I     Status: None   Collection Time    03/11/13  1:35 AM      Result Value Ref Range   Troponin I <0.30  <0.30 ng/mL   Comment:            Due to the release kinetics of cTnI,     a negative result within the first hours     of the onset of symptoms does not rule out     myocardial infarction with certainty.     If myocardial infarction is still suspected,     repeat the test at appropriate intervals.  CBG MONITORING, ED     Status: Abnormal   Collection Time    03/11/13  1:39 AM      Result Value Ref Range   Glucose-Capillary 168 (*)  70 - 99 mg/dL  CULTURE, BLOOD (ROUTINE X 2)     Status: None   Collection Time    03/11/13  2:13 AM      Result Value Ref Range   Specimen Description BLOOD RIGHT ANTECUBITAL     Special Requests       Value: BOTTLES DRAWN AEROBIC AND ANAEROBIC AEB 4CC ANA 6CC   Culture PENDING     Report Status PENDING    CULTURE, BLOOD (ROUTINE X 2)     Status: None   Collection Time    03/11/13  2:13 AM      Result Value Ref Range   Specimen Description BLOOD RIGHT HAND     Special Requests BOTTLES DRAWN AEROBIC AND ANAEROBIC 5CC EACH     Culture PENDING     Report Status PENDING    LACTIC ACID, PLASMA     Status: None   Collection Time    03/11/13  2:13 AM      Result Value Ref Range   Lactic Acid, Venous 1.5  0.5 - 2.2 mmol/L  MRSA PCR SCREENING     Status: None   Collection Time    03/11/13  4:16 AM      Result Value Ref Range   MRSA by PCR NEGATIVE  NEGATIVE   Comment:            The GeneXpert MRSA Assay (FDA     approved for NASAL specimens     only), is one component of a     comprehensive MRSA colonization     surveillance program. It is not     intended to diagnose MRSA     infection nor to guide or     monitor treatment for     MRSA infections.  LIPID PANEL     Status: Abnormal   Collection Time    03/11/13  5:03 AM      Result Value Ref Range   Cholesterol 86  0 - 200  mg/dL   Triglycerides 47  <150 mg/dL   HDL 35 (*) >39 mg/dL   Total CHOL/HDL Ratio 2.5     VLDL 9  0 - 40 mg/dL   LDL Cholesterol 42  0 - 99 mg/dL   Comment:            Total Cholesterol/HDL:CHD Risk     Coronary Heart Disease Risk Table                         Men   Women      1/2 Average Risk   3.4   3.3      Average Risk       5.0   4.4      2 X Average Risk   9.6   7.1      3 X Average Risk  23.4   11.0                Use the calculated Patient Ratio     above and the CHD Risk Table     to determine the patient's CHD Risk.                ATP III CLASSIFICATION (LDL):      <100     mg/dL   Optimal      100-129  mg/dL   Near or Above  Optimal      130-159  mg/dL   Borderline      160-189  mg/dL   High      >190     mg/dL   Very High    Dg Chest 1 View  03/11/2013   CLINICAL DATA Right extremity weakness.  EXAM CHEST - 1 VIEW  COMPARISON November 04, 2012.  FINDINGS Stable cardiomegaly. Status post coronary artery bypass graft. Old right rib fractures are noted. No pneumothorax or pleural effusion is noted. No acute pulmonary disease is noted.  IMPRESSION No acute cardiopulmonary abnormality seen.  SIGNATURE  Electronically Signed   By: Sabino Dick M.D.   On: 03/11/2013 02:44   Ct Head Wo Contrast  03/11/2013   CLINICAL DATA Right arm weakness, expressive aphasia  EXAM CT HEAD WITHOUT CONTRAST  TECHNIQUE Contiguous axial images were obtained from the base of the skull through the vertex without intravenous contrast.  COMPARISON 06/02/2012  FINDINGS Subtle left basal ganglia hypodensity and loss of left insular ribbon series 2, image 18. Right coronal radiata hypodensity is similar and may reflect sequelae of a prior lacunar infarction. Multiple other scattered areas of subcortical and periventricular white matter hypodensity may reflect chronic microangiopathic change. No intraparenchymal hemorrhage, mass, mass effect, or abnormal extra-axial fluid collection.  No hydrocephalus. Atherosclerotic vascular calcifications. Paranasal sinuses and mastoid air cells are predominantly clear.  IMPRESSION Left basal ganglia hypodensity and loss of left insular ribbon may reflect an acute infarction.  Right corona radiata remote lacunar infarction and multifocal white matter hypodensities, similar to prior.  Critical Value/emergent results were called by telephone at the time of interpretation on 03/11/2013 at 1:59 AM to Dr. Jola Schmidt , who verbally acknowledged these results.  SIGNATURE  Electronically Signed   By: Carlos Levering M.D.   On: 03/11/2013 02:02        Daneil Beem A. Merlene Laughter, M.D.  Diplomate, [employer] of Psychiatry and Neurology ( Neurology). 03/11/2013, 9:38 AM

## 2013-03-11 NOTE — ED Notes (Signed)
Patient has wound dressing noted to sacral area. Redness noted around dressing area. Patient's roommate unaware of any skin wounds or treatment at this time.

## 2013-03-12 ENCOUNTER — Inpatient Hospital Stay (HOSPITAL_COMMUNITY): Payer: PRIVATE HEALTH INSURANCE

## 2013-03-12 DIAGNOSIS — R911 Solitary pulmonary nodule: Secondary | ICD-10-CM | POA: Diagnosis present

## 2013-03-12 DIAGNOSIS — I6789 Other cerebrovascular disease: Secondary | ICD-10-CM

## 2013-03-12 DIAGNOSIS — I10 Essential (primary) hypertension: Secondary | ICD-10-CM

## 2013-03-12 LAB — GLUCOSE, CAPILLARY
GLUCOSE-CAPILLARY: 192 mg/dL — AB (ref 70–99)
GLUCOSE-CAPILLARY: 130 mg/dL — AB (ref 70–99)
Glucose-Capillary: 224 mg/dL — ABNORMAL HIGH (ref 70–99)
Glucose-Capillary: 166 mg/dL — ABNORMAL HIGH (ref 70–99)

## 2013-03-12 MED ORDER — DIGOXIN 0.25 MG/ML IJ SOLN
0.5000 mg | Freq: Once | INTRAMUSCULAR | Status: AC
  Administered 2013-03-12: 0.5 mg via INTRAVENOUS
  Filled 2013-03-12: qty 2

## 2013-03-12 MED ORDER — VANCOMYCIN HCL IN DEXTROSE 1-5 GM/200ML-% IV SOLN
1000.0000 mg | Freq: Once | INTRAVENOUS | Status: AC
  Administered 2013-03-12: 1000 mg via INTRAVENOUS
  Filled 2013-03-12: qty 200

## 2013-03-12 MED ORDER — METOPROLOL TARTRATE 1 MG/ML IV SOLN
INTRAVENOUS | Status: AC
  Administered 2013-03-12: 5 mg
  Filled 2013-03-12: qty 5

## 2013-03-12 MED ORDER — VANCOMYCIN HCL IN DEXTROSE 1-5 GM/200ML-% IV SOLN
1000.0000 mg | Freq: Two times a day (BID) | INTRAVENOUS | Status: DC
  Administered 2013-03-12 – 2013-03-13 (×4): 1000 mg via INTRAVENOUS
  Filled 2013-03-12 (×9): qty 200

## 2013-03-12 MED ORDER — VANCOMYCIN HCL IN DEXTROSE 1-5 GM/200ML-% IV SOLN
INTRAVENOUS | Status: AC
  Filled 2013-03-12: qty 200

## 2013-03-12 MED ORDER — METOPROLOL TARTRATE 1 MG/ML IV SOLN
5.0000 mg | Freq: Once | INTRAVENOUS | Status: AC
  Administered 2013-03-12: 5 mg via INTRAVENOUS

## 2013-03-12 MED ORDER — DIGOXIN 0.25 MG/ML IJ SOLN
0.2500 mg | Freq: Four times a day (QID) | INTRAMUSCULAR | Status: AC
  Administered 2013-03-12: 0.25 mg via INTRAVENOUS
  Filled 2013-03-12: qty 2

## 2013-03-12 MED ORDER — DIGOXIN 0.25 MG/ML IJ SOLN
0.2500 mg | Freq: Four times a day (QID) | INTRAMUSCULAR | Status: DC
Start: 2013-03-12 — End: 2013-03-12
  Administered 2013-03-12: 0.25 mg via INTRAVENOUS
  Filled 2013-03-12: qty 2

## 2013-03-12 MED ORDER — METOPROLOL TARTRATE 1 MG/ML IV SOLN
2.5000 mg | INTRAVENOUS | Status: DC | PRN
  Filled 2013-03-12: qty 5

## 2013-03-12 MED ORDER — IOHEXOL 350 MG/ML SOLN
80.0000 mL | Freq: Once | INTRAVENOUS | Status: AC | PRN
  Administered 2013-03-12: 80 mL via INTRAVENOUS

## 2013-03-12 MED ORDER — SODIUM CHLORIDE 0.9 % IJ SOLN
INTRAMUSCULAR | Status: AC
  Filled 2013-03-12: qty 500

## 2013-03-12 MED ORDER — ENOXAPARIN SODIUM 40 MG/0.4ML ~~LOC~~ SOLN
40.0000 mg | SUBCUTANEOUS | Status: DC
  Administered 2013-03-13 – 2013-03-17 (×5): 40 mg via SUBCUTANEOUS
  Filled 2013-03-12 (×5): qty 0.4

## 2013-03-12 NOTE — Progress Notes (Signed)
Patient ID: [name], male   DOB: [birth year], 68 y.o.   MRN: [id number]  [place] A. Merlene Laughter, MD     [email or web address]          [name] is an 68 y.o. male.   Assessment/Plan: 1. 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 The etiology is undoubtedly due to cardioembolic phenomenon from atrial fibrillation. The patient unfortunately has been noncompliant with medical visits and anticoagulation previously. This can be reassessed at a later date. In meantime we can continue with antiplatelet agent. A TEE is unlikely to change patient management and therefore I do not recommend this. I will sign off. Reconsult as needed.     He seems more responsive today.  GENERAL: He is in no acute distress.  HEENT: Supple. Atraumatic normocephalic.  ABDOMEN: soft  EXTREMITIES: No edema  BACK: Normal.  SKIN: Normal by inspection.  MENTAL STATUS: He is awake and alert. He is noted to have a severe aphasia with the essentially no verbal output. He does tends to mimic a midline commands but clearly does not follow either axial or appendicular commands consistently. He seems to follow commands more today but still not consistently. He follows mostly midline commands intermittently. CRANIAL NERVES: Pupils are equal, round and reactive to light and accommodation; extra ocular movements are full, there is no significant nystagmus; upper and lower facial muscles are normal in strength and symmetric, there is no flattening of the nasolabial folds.  MOTOR: There appears to be a downward drift on the right side and but 5/5. The left side shows normal tone, bulk and strength.  COORDINATION: No dysmetrias are observed. There are no tremors.       Objective: Vital signs in last 24 hours: Temp:  [97.6 F (36.4 C)-98.4 F (36.9 C)] 98.2 F (36.8 C) (03/12 0400) Pulse Rate:  [74-145] 84 (03/11 1700) Resp:  [15-37] 34  (03/12 0700) BP: (109-167)/(66-109) 167/105 mmHg (03/12 0700) SpO2:  [93 %-100 %] 100 % (03/11 1700) Weight:  [73.7 kg (162 lb 7.7 oz)] 73.7 kg (162 lb 7.7 oz) (03/12 0500)  Intake/Output from previous day: 03/11 0701 - 03/12 0700 In: 1116.3 [I.V.:1116.3] Out: -  Intake/Output this shift:   Nutritional status: Cardiac   Lab Results: Results for orders placed during the hospital encounter of 03/11/13 (from the past 48 hour(s))  ETHANOL     Status: None   Collection Time    03/11/13  1:35 AM      Result Value Ref Range   Alcohol, Ethyl (B) <11  0 - 11 mg/dL   Comment:            LOWEST DETECTABLE LIMIT FOR     SERUM ALCOHOL IS 11 mg/dL     FOR MEDICAL PURPOSES ONLY  PROTIME-INR     Status: None   Collection Time    03/11/13  1:35 AM      Result Value Ref Range   Prothrombin Time 14.2  11.6 - 15.2 seconds   INR 1.12  0.00 - 1.49  APTT     Status: None   Collection Time    03/11/13  1:35 AM      Result Value Ref Range   aPTT 30  24 - 37 seconds  CBC     Status: Abnormal   Collection Time    03/11/13  1:35 AM      Result Value Ref Range   WBC 7.1  4.0 - 10.5 K/uL   RBC 5.31  4.22 - 5.81 MIL/uL   Hemoglobin 14.3  13.0 - 17.0 g/dL   HCT 43.9  39.0 - 52.0 %   MCV 82.7  78.0 - 100.0 fL   MCH 26.9  26.0 - 34.0 pg   MCHC 32.6  30.0 - 36.0 g/dL   RDW 16.1 (*) 11.5 - 15.5 %   Platelets 170  150 - 400 K/uL  DIFFERENTIAL     Status: None   Collection Time    03/11/13  1:35 AM      Result Value Ref Range   Neutrophils Relative % 76  43 - 77 %   Neutro Abs 5.4  1.7 - 7.7 K/uL   Lymphocytes Relative 14  12 - 46 %   Lymphs Abs 1.0  0.7 - 4.0 K/uL   Monocytes Relative 9  3 - 12 %   Monocytes Absolute 0.6  0.1 - 1.0 K/uL   Eosinophils Relative 1  0 - 5 %   Eosinophils Absolute 0.1  0.0 - 0.7 K/uL   Basophils Relative 0  0 - 1 %   Basophils Absolute 0.0  0.0 - 0.1 K/uL  COMPREHENSIVE METABOLIC PANEL     Status: Abnormal   Collection Time    03/11/13  1:35 AM      Result  Value Ref Range   Sodium 139  137 - 147 mEq/L   Potassium 3.9  3.7 - 5.3 mEq/L   Chloride 102  96 - 112 mEq/L   CO2 23  19 - 32 mEq/L   Glucose, Bld 174 (*) 70 - 99 mg/dL   BUN 16  6 - 23 mg/dL   Creatinine, Ser 1.10  0.50 - 1.35 mg/dL   Calcium 9.5  8.4 - 10.5 mg/dL   Total Protein 7.5  6.0 - 8.3 g/dL   Albumin 3.5  3.5 - 5.2 g/dL   AST 12  0 - 37 U/L   ALT 10  0 - 53 U/L   Alkaline Phosphatase 96  39 - 117 U/L   Total Bilirubin 0.6  0.3 - 1.2 mg/dL   GFR calc non Af Amer 67 (*) >90 mL/min   GFR calc Af Amer 77 (*) >90 mL/min   Comment: (NOTE)     The eGFR has been calculated using the CKD EPI equation.     This calculation has not been validated in all clinical situations.     eGFR's persistently <90 mL/min signify possible Chronic Kidney     Disease.  TROPONIN I     Status: None   Collection Time    03/11/13  1:35 AM      Result Value Ref Range   Troponin I <0.30  <0.30 ng/mL   Comment:            Due to the release kinetics of cTnI,     a negative result within the first hours     of the onset of symptoms does not rule out     myocardial infarction with certainty.     If myocardial infarction is still suspected,     repeat the test at appropriate intervals.  CBG MONITORING, ED     Status: Abnormal   Collection Time    03/11/13  1:39 AM      Result Value Ref Range   Glucose-Capillary 168 (*) 70 - 99 mg/dL  CULTURE, BLOOD (ROUTINE X 2)     Status: None  Collection Time    03/11/13  2:13 AM      Result Value Ref Range   Specimen Description BLOOD RIGHT ANTECUBITAL     Special Requests       Value: BOTTLES DRAWN AEROBIC AND ANAEROBIC AEB 4CC ANA 6CC   Culture NO GROWTH <24 HRS     Report Status PENDING    CULTURE, BLOOD (ROUTINE X 2)     Status: None   Collection Time    03/11/13  2:13 AM      Result Value Ref Range   Specimen Description BLOOD RIGHT HAND     Special Requests BOTTLES DRAWN AEROBIC AND ANAEROBIC 5CC EACH     Culture       Value: GRAM POSITIVE  COCCI IN CLUSTERS     Gram Stain Report Called to,Read Back By and Verified With: MCDANIEL M AT 0040 ON [id number] BY FORSYTH K   Report Status PENDING    LACTIC ACID, PLASMA     Status: None   Collection Time    03/11/13  2:13 AM      Result Value Ref Range   Lactic Acid, Venous 1.5  0.5 - 2.2 mmol/L  MRSA PCR SCREENING     Status: None   Collection Time    03/11/13  4:16 AM      Result Value Ref Range   MRSA by PCR NEGATIVE  NEGATIVE   Comment:            The GeneXpert MRSA Assay (FDA     approved for NASAL specimens     only), is one component of a     comprehensive MRSA colonization     surveillance program. It is not     intended to diagnose MRSA     infection nor to guide or     monitor treatment for     MRSA infections.  HEMOGLOBIN A1C     Status: Abnormal   Collection Time    03/11/13  5:03 AM      Result Value Ref Range   Hemoglobin A1C 6.6 (*) <5.7 %   Comment: (NOTE)                                                                               According to the ADA Clinical Practice Recommendations for 2011, when     HbA1c is used as a screening test:      >=6.5%   Diagnostic of Diabetes Mellitus               (if abnormal result is confirmed)     5.7-6.4%   Increased risk of developing Diabetes Mellitus     References:Diagnosis and Classification of Diabetes Mellitus,Diabetes     TIRW,4315,40(GQQPY 1):S62-S69 and Standards of Medical Care in             Diabetes - 2011,Diabetes Care,2011,34 (Suppl 1):S11-S61.   Mean Plasma Glucose 143 (*) <117 mg/dL   Comment: Performed at [facility]     Status: Abnormal   Collection Time    03/11/13  5:03 AM      Result Value Ref Range   Cholesterol 86  0 - 200 mg/dL   Triglycerides 47  <150 mg/dL   HDL 35 (*) >39 mg/dL   Total CHOL/HDL Ratio 2.5     VLDL 9  0 - 40 mg/dL   LDL Cholesterol 42  0 - 99 mg/dL   Comment:            Total Cholesterol/HDL:CHD Risk     Coronary Heart Disease Risk Table                          Men   Women      1/2 Average Risk   3.4   3.3      Average Risk       5.0   4.4      2 X Average Risk   9.6   7.1      3 X Average Risk  23.4   11.0                Use the calculated Patient Ratio     above and the CHD Risk Table     to determine the patient's CHD Risk.                ATP III CLASSIFICATION (LDL):      <100     mg/dL   Optimal      100-129  mg/dL   Near or Above                        Optimal      130-159  mg/dL   Borderline      160-189  mg/dL   High      >190     mg/dL   Very High  GLUCOSE, CAPILLARY     Status: None   Collection Time    03/11/13  7:45 AM      Result Value Ref Range   Glucose-Capillary 98  70 - 99 mg/dL  GLUCOSE, CAPILLARY     Status: Abnormal   Collection Time    03/11/13 11:10 AM      Result Value Ref Range   Glucose-Capillary 100 (*) 70 - 99 mg/dL  GLUCOSE, CAPILLARY     Status: Abnormal   Collection Time    03/11/13  4:34 PM      Result Value Ref Range   Glucose-Capillary 128 (*) 70 - 99 mg/dL  GLUCOSE, CAPILLARY     Status: Abnormal   Collection Time    03/11/13  9:54 PM      Result Value Ref Range   Glucose-Capillary 120 (*) 70 - 99 mg/dL    Lipid Panel  Recent Labs  03/11/13 0503  CHOL 86  TRIG 47  HDL 35*  CHOLHDL 2.5  VLDL 9  LDLCALC 42    Studies/Results: Dg Chest 1 View  03/11/2013   CLINICAL DATA Right extremity weakness.  EXAM CHEST - 1 VIEW  COMPARISON November 04, 2012.  FINDINGS Stable cardiomegaly. Status post coronary artery bypass graft. Old right rib fractures are noted. No pneumothorax or pleural effusion is noted. No acute pulmonary disease is noted.  IMPRESSION No acute cardiopulmonary abnormality seen.  SIGNATURE  Electronically Signed   By: Sabino Dick M.D.   On: 03/11/2013 02:44   Ct Head Wo Contrast  03/11/2013   CLINICAL DATA Right arm weakness, expressive aphasia  EXAM CT HEAD WITHOUT CONTRAST  TECHNIQUE Contiguous axial images were obtained from the  base of the skull through  the vertex without intravenous contrast.  COMPARISON 06/02/2012  FINDINGS Subtle left basal ganglia hypodensity and loss of left insular ribbon series 2, image 18. Right coronal radiata hypodensity is similar and may reflect sequelae of a prior lacunar infarction. Multiple other scattered areas of subcortical and periventricular white matter hypodensity may reflect chronic microangiopathic change. No intraparenchymal hemorrhage, mass, mass effect, or abnormal extra-axial fluid collection. No hydrocephalus. Atherosclerotic vascular calcifications. Paranasal sinuses and mastoid air cells are predominantly clear.  IMPRESSION Left basal ganglia hypodensity and loss of left insular ribbon may reflect an acute infarction.  Right corona radiata remote lacunar infarction and multifocal white matter hypodensities, similar to prior.  Critical Value/emergent results were called by telephone at the time of interpretation on 03/11/2013 at 1:59 AM to Dr. Jola Schmidt , who verbally acknowledged these results.  SIGNATURE  Electronically Signed   By: Carlos Levering M.D.   On: 03/11/2013 02:02   US Carotid Duplex Bilateral  03/11/2013   CLINICAL DATA CVA, hypertension  EXAM BILATERAL CAROTID DUPLEX ULTRASOUND  TECHNIQUE Gray scale imaging, color Doppler and duplex ultrasound were performed of bilateral carotid and vertebral arteries in the neck.  COMPARISON CT HEAD W/O CM dated 03/11/2013  FINDINGS Criteria: Quantification of carotid stenosis is based on velocity parameters that correlate the residual internal carotid diameter with NASCET-based stenosis levels, using the diameter of the distal internal carotid lumen as the denominator for stenosis measurement.  The following velocity measurements were obtained:  RIGHT  ICA:  109/33 cm/sec  CCA:  35/59 cm/sec  SYSTOLIC ICA/CCA RATIO:  2.0  DIASTOLIC ICA/CCA RATIO:  3.4  ECA:  72 cm/sec  LEFT  ICA:  59/14 cm/sec  CCA:  74/16 cm/sec  SYSTOLIC ICA/CCA RATIO:  1.1  DIASTOLIC ICA/CCA  RATIO:  1.1  ECA:  112 cm/sec  RIGHT CAROTID ARTERY: There is rather large amount of the eccentric echogenic partially shadowing plaque within the mid aspect of the right common carotid artery (representative image 7) extending to involve the distal aspects of the right common carotid artery (image 11). There is eccentric echogenic partially shadowing plaque within the right carotid bulb (images 14 and 16) extending to involve the origin and proximal aspect of the right internal carotid artery (image 24), not definitely resulting in elevated peak systolic velocities within the interrogated course of the right internal carotid artery to suggest a hemodynamically significant stenosis.  RIGHT VERTEBRAL ARTERY:  Antegrade flow  LEFT CAROTID ARTERY: There is eccentric echogenic partially shadowing plaque within the mid and distal aspects of the left common carotid artery (representative images 42, 45 and 46). There is eccentric mixed echogenic partially shadowing plaque within the left carotid bulb (images 50 and 52), extending to involve the origin and proximal aspect of the left internal carotid artery (image 61), not resulting in elevated peak systolic velocities within the interrogated course of the left internal carotid artery to suggest a hemodynamically significant stenosis.  LEFT VERTEBRAL ARTERY:  Antegrade flow  IMPRESSION Rather extensive amount of irregular atherosclerotic plaque bilaterally, not definitely resulting in hemodynamically significant stenosis though could serve as a source of distal embolism. Further evaluation could be performed with CTA as clinically indicated.  SIGNATURE  Electronically Signed   By: Sandi Mariscal M.D.   On: 03/11/2013 16:55    Medications:  Scheduled Meds: . aspirin  300 mg Rectal Daily   Or  . aspirin  325 mg Oral Daily  . atorvastatin  80 mg Oral  q1800  . enoxaparin (LOVENOX) injection  40 mg Subcutaneous Q24H  . insulin aspart  0-9 Units Subcutaneous TID WC  .  vancomycin  1,000 mg Intravenous Q12H   Continuous Infusions: . sodium chloride 75 mL/hr at 03/11/13 2000  . diltiazem (CARDIZEM) infusion 15 mg/hr (03/12/13 0600)   PRN Meds:.metoprolol     LOS: 1 day   Cordarrell Sane A. Merlene Laughter, M.D.  Diplomate, Tax adviser of Psychiatry and Neurology ( Neurology).

## 2013-03-12 NOTE — Progress Notes (Signed)
Speech Language Pathology Treatment: Dysphagia   Patient Details Name: [name] MRN: [id number] DOB: 29-May-1945 Today's Date: 03/12/2013 Time: 2952-8413 SLP Time Calculation (min): 15 min  Assessment / Plan / Recommendation Clinical Impression  [name] was seen during lunch meal to assess for diet tolerance and possible upgrade. RN reports that pt tolerating diet well. He was found to have thin liquids on his tray, despite nectar-thick liquid recommendation yesterday. Pt had eaten very little on his tray, but was agreeable to more when SLP assisted with feeding. Pt shows signs of oral apraxia, but manipulated oral structures well during functional task (eating). Pt had some trouble masticating solids likely due to edentulous status. No signs or symptoms of aspiration noted with thin liquids. SLP will upgrade to thins and downgrade textures to mech soft.    HPI HPI: [name] to the hospital with a left basal ganglia infarct.  He has a hx of right brain stroke, CAD, DM, HTN.  He lives with a roommate and I have no information as to prior functional status.  At the time of admission he displayed right sided weakness, expressive aphasia and left gaze preference.    Pertinent Vitals   SLP Plan  Goals updated (Will need language/speech evaluation and follow up)    Recommendations Diet recommendations: Dysphagia 3 (mechanical soft);Thin liquid Liquids provided via: Cup;Straw Medication Administration: Whole meds with puree Supervision: Patient able to self feed;Intermittent supervision to cue for compensatory strategies Compensations: Slow rate;Small sips/bites Postural Changes and/or Swallow Maneuvers: Seated upright 90 degrees;Upright 30-60 min after meal;Out of bed for meals              General recommendations: Rehab consult Oral Care Recommendations: Oral care BID Follow up Recommendations: Skilled Nursing facility;Home health SLP;24 hour supervision/assistance Plan:  Goals updated (Will need language/speech evaluation and follow up)    Thank you,  Genene Churn, [place]      [place] 03/12/2013, 12:55 PM

## 2013-03-12 NOTE — Progress Notes (Signed)
PT Cancellation Note  Patient Details Name: [name] MRN: [id number] DOB: 04-04-1945   Cancelled Treatment:    Reason Eval/Treat Not Completed: Medical issues which prohibited therapy  An attempt was made to work with pt this morning, however his HR was running in the 150s.  We will hold PT for today and try again tomorrow if he is medically stable. [name] 03/12/2013, 2:32 PM

## 2013-03-12 NOTE — Consult Note (Signed)
CARDIOLOGY CONSULT NOTE   Patient ID: [name] MRN: [id number] DOB/AGE: 70-Apr-1947 70 y.o.  Admit Date: 03/11/2013 Referring Physician: PTH Primary Physician: Carolee Rota, NP Consulting Cardiologist: Carlyle Dolly MD Primary Cardiologist: Minus Breeding MD  Reason for Consultation: Rapid Atrial fibrillation  Clinical Summary [name] is a 68 y.o.male admitted with CVA with right sided hemiparesis and inability to speak.He was found by his roommate's wife diaphoretic, with right sided weakness and aphasic. Brought to ER.  He was not given TPA per neurology, in the setting of intracerebral bleed history. CT scan demonstrated left basal ganglia hypodensity and loss of left insular ribbon may reflect an acute infarction. He was also found to be in rapid atrial fib rate of 110 bpm, with RBBB. He was started on dilitazem gtt after 15 mg bolus.       Due to dysphasia, he is a difficult historian. History is obtained from current and PMH. He is able to shake his head yes or no, follow commands, make verbal non-coherent vocalizations, and move his extremities.          He was recently discharged on 03/03/2013 after admission for atrial flutter, with fecal impaction at that time. He was not placed on anticoagulation per Dr. Myles Gip note due to prominent history of medical non-compliance, and cerebral hemorrhage.         He had a history of atrial flutter. Ischemic heart disease, CAD with CABG in 2004 (2 vessel), with DES to prox RCA in 2006, AoV stenosis (mild per echcardiogram 06/2012). On last admission, rapid atrial flutter was related to medical non-compliance, not taking metoprolol.On recent discharge, he was sent home on metoprolol XL 12.5 mg at HS, amlodipine 10 mg, and ASA 81 mg. He is currently on diltiazem at 15 mg/hr with HR in the 130's.      Of noted, blood cultures were drawn on admission and found to be positive for gram positive cocci in clusters. Vancomycin is now  on board.    Allergies  Allergen Reactions  . Benadryl [Diphenhydramine Hcl] Other (See Comments)    Hot Flashes "Like Niaspan"    Medications Scheduled Medications: . aspirin  300 mg Rectal Daily   Or  . aspirin  325 mg Oral Daily  . atorvastatin  80 mg Oral q1800  . enoxaparin (LOVENOX) injection  40 mg Subcutaneous Q24H  . insulin aspart  0-9 Units Subcutaneous TID WC  . sodium chloride      . vancomycin  1,000 mg Intravenous Q12H    Infusions: . sodium chloride 75 mL/hr at 03/11/13 2000  . diltiazem (CARDIZEM) infusion 15 mg/hr (03/12/13 0845)    PRN Medications: metoprolol   Past Medical History  Diagnosis Date  . Coronary atherosclerosis of native coronary artery     a. CABG in 2004 - LIMA to LAD, SVG to diag, SVG to OM, SVG to PDA. EF was 40%. b. Cath 2006 s/p DES to prox RCA.  . Type 2 diabetes mellitus   . Hyperlipidemia   . Essential hypertension, benign   . Atypical atrial flutter     a. Dx 06/2012. b. Not a candidate for anticoag due to fall, tenuous social situation.  . Ischemic cardiomyopathy     a. EF 40% in 2004. b. EF 55% in 06/2012.  Marland Kitchen Aortic stenosis     a. By echo 06/2012, very calcified but mean gradient 74mmHg - will need clinical f/u and serial echoes.  . Low TSH level  a. 06/2012, f/u pcp.  Marland Kitchen Poor social situation   . Cerebral hemorrhage     a. At time of CABG - had post-op encephalopathy and small frontal hemorrhage in 2004.  Marland Kitchen Noncompliance     Past Surgical History  Procedure Laterality Date  . Coronary artery bypass graft      2004    Family History  Problem Relation Age of Onset  . CAD Father 19    Social History [name] reports that he has been smoking Cigarettes.  He has a 12.5 pack-year smoking history. He has never used smokeless tobacco. [name] reports that he does not drink alcohol.  Review of Systems Otherwise reviewed and negative except as outlined.  Physical Examination Blood pressure 167/105, pulse  84, temperature 98.2 F (36.8 C), temperature source Oral, resp. rate 34, height 5\' 10"  (1.778 m), weight 162 lb 7.7 oz (73.7 kg), SpO2 100.00%.  Intake/Output Summary (Last 24 hours) at 03/12/13 1102 Last data filed at 03/11/13 2000  Gross per 24 hour  Intake 1116.25 ml  Output      0 ml  Net 1116.25 ml    Telemetry:  HEENT: Conjunctiva and lids normal, oropharynx clear with moist mucosa. Neck: Supple, no elevated JVP carotid bruits, no thyromegaly. Lungs: Clear to auscultation, nonlabored breathing at rest. Cardiac: Iregular rate and rhythm, 1/6 systolic murmur, no pericardial rub. Abdomen: Soft, nontender, no hepatomegaly, bowel sounds present, no guarding or rebound. Extremities: Non pitting edema is noted bilaterally with erythema of the LE bilaterally., distal pulses 2+. Skin: Warm and dry. Musculoskeletal: No kyphosis. Neuropsychiatric: Alert and oriented x3, weakness on the right side. Follows commands, dysphasia is noted. Making verbal sounds,  Incoherent with attempt to speak, shaking head yes and no.   Prior Cardiac Testing/Procedures  1. Echocardiogram: 03/02/2013 Left ventricle: The cavity size was normal. Wall thickness was increased in a pattern of moderate LVH. Systolic function was normal. The estimated ejection fraction was in the range of 50% to 55%. There is hypokinesis of the basalinferolateral myocardium. Features are consistent with a possible pseudonormal left ventricular filling pattern, with concomitant abnormal relaxation and increased filling pressure (grade 2 diastolic dysfunction). - Aortic valve: Mildly calcified annulus. Trileaflet; moderately calcified leaflets. Cusp separation was moderately to severely reduced. There was moderate stenosis. Trivial regurgitation. Mean gradient: 17mm Hg (S). Peak gradient: 46mm Hg (S). Valve area: 1.29cm^2(VTI). Valve area: 1.19cm^2 (Vmax). - Aortic root: The aortic root was mildly to moderately dilated. -  Mitral valve: Calcified annulus. Mildly thickened, mildly calcified leaflets . Leaflet separation was mildly reduced. Mild to moderate regurgitation. Regurgitant volume 35 mL by continuity equation. - Left atrium: The atrium was severely dilated. - Right ventricle: The cavity size was moderately dilated. Systolic function was moderately reduced. - Right atrium: The atrium was moderately dilated. Central venous pressure: 12mm Hg (est). - Atrial septum: No defect or patent foramen ovale was identified. - Tricuspid valve: Mild regurgitation. - Pulmonary arteries: PA peak pressure: 50mm Hg (S). - Pericardium, extracardiac: There was no pericardial effusion.  2.Carotid Ultrasound 03/11/2013 IMPRESSION Rather extensive amount of irregular atherosclerotic plaque bilaterally, not definitely resulting in hemodynamically significant stenosis though could serve as a source of distal embolism. Further evaluation could be performed with CTA as clinically indicated.  3. Cardiac Cath 07/2004 ANGIOGRAPHIC DATA: The right coronary artery was a large, dominant vessel  that supplied that was very tortuous and calcified proximally. There was  approximately 40% narrowing in the region of the ostium.  There  was least 90% to 95% stenosis at the site of dilatation from two days  ago, suggesting significant elastic recoil in this calcified region. After  this proximal bend, there was again mild 30-40% narrowings diffusely  throughout the midportion of the vessel. The vessel gave rise to a large  PDA and large posterolateral system. Following successful PTCA with  ultimate stenting, the 90-95% stenosis was reduced to 0% with ultimately a  3.0 x 12 mm drug-eluting paclitaxel Taxus stent being postdilated to 3.5 mm.  There was TIMI-3 flow. There was no evidence for dissection.   IMPRESSION: Difficult but successful percutaneous transluminal coronary  angioplasty and stenting of a 90% restenotic lesion in the  proximal right  coronary artery with ultimate stenting utilizing a 3.0 x 12 mm Taxus stent  postdilated to 3.5 mm, done with Integrilin/Plavix and heparinization.  CABG 11/30/2002  1. Median sternotomy.  2. Extracorporeal circulation.  3. Coronary artery bypass grafting x 4 (left internal mammary artery to LAD,  saphenous vein graft to second diagonal, saphenous vein graft to first  obtuse marginal, saphenous vein graft to posterior descending),  endoscopic vein harvest right leg.   Lab Results  Basic Metabolic Panel:  Recent Labs Lab 03/11/13 0135  NA 139  K 3.9  CL 102  CO2 23  GLUCOSE 174*  BUN 16  CREATININE 1.10  CALCIUM 9.5    Liver Function Tests:  Recent Labs Lab 03/11/13 0135  AST 12  ALT 10  ALKPHOS 96  BILITOT 0.6  PROT 7.5  ALBUMIN 3.5    CBC:  Recent Labs Lab 03/11/13 0135  WBC 7.1  NEUTROABS 5.4  HGB 14.3  HCT 43.9  MCV 82.7  PLT 170    Cardiac Enzymes:  Recent Labs Lab 03/11/13 0135  TROPONINI <0.30      Radiology: Dg Chest 1 View  03/11/2013   CLINICAL DATA Right extremity weakness.  EXAM CHEST - 1 VIEW  COMPARISON November 04, 2012.  FINDINGS Stable cardiomegaly. Status post coronary artery bypass graft. Old right rib fractures are noted. No pneumothorax or pleural effusion is noted. No acute pulmonary disease is noted.  IMPRESSION No acute cardiopulmonary abnormality seen.  SIGNATURE  Electronically Signed   By: Sabino Dick M.D.   On: 03/11/2013 02:44   Ct Head Wo Contrast  03/11/2013   CLINICAL DATA Right arm weakness, expressive aphasia  EXAM CT HEAD WITHOUT CONTRAST  TECHNIQUE ocephalus. Atherosclerotic vascular calcifications. Paranasal sinuses and mastoid air cells are predominantly clear.  IMPRESSION Left basal ganglia hypodensity and loss of left insular ribbon may reflect an acute infarction.  Right corona radiata remote lacunar infarction and multifocal white matter hypodensities, similar to prior.  Critical  Value/emergent results were called by telephone at the time of interpretation on 03/11/2013 at 1:59 AM to Dr. Jola Schmidt , who verbally acknowledged these results.  SIGNATURE  Electronically Signed   By: Carlos Levering M.D.   On: 03/11/2013 02:02   Ct Angio Neck W/cm &/or Wo/cm  03/12/2013   CLINICAL DATA:  Abnormal carotid Doppler study with extensive irregular atherosclerotic plaque bilaterally but no definite hemodynamically significant stenosis. Stroke.  EXAM: CT ANGIOGRAPHY NECK  TECHNIQUE: Multidetector CT imaging of the neck was performed using the standard protocol during bolus administration of intravenous contrast. Multiplanar CT image reconstructions and MIPs were obtained to evaluate the vascular anatomy. Carotid stenosis measurements (when applicable) are obtained utilizing NASCET criteria, using the distal internal carotid diameter as the denominator.  CONTRAST:  62mL OMNIPAQUE  IOHEXOL 350 MG/ML SOLN  COMPARISON:  US CAROTID DUPLEX BILAT dated 03/11/2013; CT HEAD W/O CM dated 03/11/2013; CT C SPINE W/O CM dated 06/02/2012; CT CHEST W/CM dated 08/25/2010  IMPRESSION Rather extensive amount of irregular atherosclerotic plaque bilaterally, not definitely resulting in hemodynamically significant stenosis though could serve as a source of distal embolism. Further evaluation could be performed with CTA as clinically indicated.  SIGNATURE  Electronically Signed   By: Sandi Mariscal M.D.   On: 03/11/2013 16:55     ECG: Atrial flutter with RBBB, 128 bpm.   Impression and Recommendations  1.Atrial flutter with RVR: He continues on diltiazem gtt at 15 mg/hour. He was also placed on prn I V lopressor 5 mg HR control. Will repeat this Q 6 hours with parameters. Creatinine is 1.10 this am. Will repeat BMET in am. Consider digoxin if renal status is stable, and  if lopressor decreases BP too much. Responded to lopressor on last admission. BP is elevated currently. He is not a candidate for anticoagulation with  history of intracranial bleeding and non-compliance.   2. Acute CVA: Abnormal CT scan with left basal ganglia hypodensity, remote lacunar infarction. He was not a candidate for TPA due to above. Some improvement in overall status since admission. Swallowing study is recommended before beginning PO mediations.  3. CAD: CABG in 2004, PCI to RCA in 2006. Continue ASA, BB is being IV.   4. Sepsis: Positive blood cultures with gram positive cocci. Now on vancomycin.     Signed: Phill Myron. Purcell Nails NP [facility] 03/12/2013, 11:02 AM Co-Sign MD

## 2013-03-12 NOTE — Progress Notes (Signed)
ANTIBIOTIC CONSULT NOTE- Follow up  Pharmacy Consult for Vancomycin Indication: Bacteremia  Allergies  Allergen Reactions  . Benadryl [Diphenhydramine Hcl] Other (See Comments)    Hot Flashes "Like Niaspan"   Patient Measurements: Height: 5\' 10"  (177.8 cm) Weight: 162 lb 7.7 oz (73.7 kg) IBW/kg (Calculated) : 73  Vital Signs: Temp: 98.2 F (36.8 C) (03/12 0400) Temp src: Oral (03/12 0400) BP: 167/105 mmHg (03/12 0700)  Labs:  Recent Labs  03/11/13 0135  WBC 7.1  HGB 14.3  PLT 170  CREATININE 1.10   Estimated Creatinine Clearance: 65.4 ml/min (by C-G formula based on Cr of 1.1).  No results found for this basename: VANCOTROUGH, Corlis Leak, VANCORANDOM, GENTTROUGH, GENTPEAK, GENTRANDOM, TOBRATROUGH, TOBRAPEAK, TOBRARND, AMIKACINPEAK, AMIKACINTROU, AMIKACIN,  in the last 72 hours   Microbiology: Recent Results (from the past 720 hour(s))  MRSA PCR SCREENING     Status: None   Collection Time    03/01/13  6:00 PM      Result Value Ref Range Status   MRSA by PCR NEGATIVE  NEGATIVE Final   Comment:            The GeneXpert MRSA Assay (FDA     approved for NASAL specimens     only), is one component of a     comprehensive MRSA colonization     surveillance program. It is not     intended to diagnose MRSA     infection nor to guide or     monitor treatment for     MRSA infections.  CULTURE, BLOOD (ROUTINE X 2)     Status: None   Collection Time    03/11/13  2:13 AM      Result Value Ref Range Status   Specimen Description BLOOD RIGHT ANTECUBITAL   Final   Special Requests     Final   Value: BOTTLES DRAWN AEROBIC AND ANAEROBIC AEB=4CC ANA=6CC   Culture NO GROWTH 1 DAY   Final   Report Status PENDING   Incomplete  CULTURE, BLOOD (ROUTINE X 2)     Status: None   Collection Time    03/11/13  2:13 AM      Result Value Ref Range Status   Specimen Description BLOOD RIGHT HAND   Final   Special Requests BOTTLES DRAWN AEROBIC AND ANAEROBIC 5CC EACH   Final   Culture      Final   Value: GRAM POSITIVE COCCI IN CLUSTERS     Gram Stain Report Called to,Read Back By and Verified With: MCDANIEL M AT 0040 ON [id number] BY FORSYTH K     Performed at [facility]   Report Status PENDING   Incomplete  MRSA PCR SCREENING     Status: None   Collection Time    03/11/13  4:16 AM      Result Value Ref Range Status   MRSA by PCR NEGATIVE  NEGATIVE Final   Comment:            The GeneXpert MRSA Assay (FDA     approved for NASAL specimens     only), is one component of a     comprehensive MRSA colonization     surveillance program. It is not     intended to diagnose MRSA     infection nor to guide or     monitor treatment for     MRSA infections.  CULTURE, BLOOD (ROUTINE X 2)     Status: None   Collection Time  03/12/13  8:30 AM      Result Value Ref Range Status   Specimen Description Blood   Final   Special Requests Normal   Final   Culture NO GROWTH <24 HRS   Final   Report Status PENDING   Incomplete  CULTURE, BLOOD (ROUTINE X 2)     Status: None   Collection Time    03/12/13  8:30 AM      Result Value Ref Range Status   Specimen Description Blood   Final   Special Requests Normal   Final   Culture NO GROWTH <24 HRS   Final   Report Status PENDING   Incomplete   Medical History: Past Medical History  Diagnosis Date  . Coronary atherosclerosis of native coronary artery     a. CABG in 2004 - LIMA to LAD, SVG to diag, SVG to OM, SVG to PDA. EF was 40%. b. Cath 2006 s/p DES to prox RCA.  . Type 2 diabetes mellitus   . Hyperlipidemia   . Essential hypertension, benign   . Atypical atrial flutter     a. Dx 06/2012. b. Not a candidate for anticoag due to fall, tenuous social situation.  . Ischemic cardiomyopathy     a. EF 40% in 2004. b. EF 55% in 06/2012.  Marland Kitchen Aortic stenosis     a. By echo 06/2012, very calcified but mean gradient 43mmHg - will need clinical f/u and serial echoes.  . Low TSH level     a. 06/2012, f/u pcp.  Marland Kitchen Poor social situation    . Cerebral hemorrhage     a. At time of CABG - had post-op encephalopathy and small frontal hemorrhage in 2004.  Marland Kitchen Noncompliance    Assessment: 68 yo male admitted 03/11/13 with stroke and PMH sig for DM, hypertension, CVA, and atrial flutter. Blood cultures drawn on admission now positive for gram positive cocci in clusters.  Estimated Creatinine Clearance: 65.4 ml/min (by C-G formula based on Cr of 1.1).  Goal of Therapy:  Vancomycin troughs 15-20 mcg/ml  Plan:   Vancomycin 1gm IV q12hrs  Check trough at steady state  Monitor labs, renal fxn, and cultures  Hart Robinsons A, RPH 03/12/2013,10:55 AM

## 2013-03-12 NOTE — Progress Notes (Signed)
eLink Physician-Brief Progress Note Patient Name: [name] DOB: 1945/04/15 MRN: [id number]  Date of Service  03/12/2013   HPI/Events of Note   AFRVR despite diltiazem @ 15 mg/hr. Pt on metoprolol PTA  eICU Interventions  PRN IV metoprolol ordered to maintain HR < 120/min   Intervention Category Major Interventions: Arrhythmia - evaluation and management  Merton Border 03/12/2013, 4:57 AM

## 2013-03-12 NOTE — Evaluation (Signed)
Speech Language Pathology Evaluation Patient Details Name: [name] MRN: [id number] DOB: 01/26/1945 Today's Date: 03/12/2013 Time: 1201-1222 SLP Time Calculation (min): 21 min  Problem List:  Patient Active Problem List   Diagnosis Date Noted  . Solitary pulmonary nodule 03/12/2013  . CVA (cerebral infarction) 03/11/2013  . Stroke 03/11/2013  . Unspecified constipation 03/02/2013  . Atrial flutter 03/01/2013  . Fecal impaction 03/01/2013  . Difficulty breathing 11/01/2012  . CHF (congestive heart failure) 11/01/2012  . Tobacco abuse 11/01/2012  . Acute diastolic congestive heart failure 11/01/2012  . Acute respiratory failure with hypoxia 11/01/2012  . COPD exacerbation 11/01/2012  . Tobacco dependence 11/01/2012  . Pulmonary edema 06/05/2012  . Diabetes mellitus 06/05/2012  . Aortic stenosis 06/05/2012  . Coronary artery disease   . Hyperlipidemia   . Hypertension   . Atypical atrial flutter    Past Medical History:  Past Medical History  Diagnosis Date  . Coronary atherosclerosis of native coronary artery     a. CABG in 2004 - LIMA to LAD, SVG to diag, SVG to OM, SVG to PDA. EF was 40%. b. Cath 2006 s/p DES to prox RCA.  . Type 2 diabetes mellitus   . Hyperlipidemia   . Essential hypertension, benign   . Atypical atrial flutter     a. Dx 06/2012. b. Not a candidate for anticoag due to fall, tenuous social situation.  . Ischemic cardiomyopathy     a. EF 40% in 2004. b. EF 55% in 06/2012.  Marland Kitchen Aortic stenosis     a. By echo 06/2012, very calcified but mean gradient 13mmHg - will need clinical f/u and serial echoes.  . Low TSH level     a. 06/2012, f/u pcp.  Marland Kitchen Poor social situation   . Cerebral hemorrhage     a. At time of CABG - had post-op encephalopathy and small frontal hemorrhage in 2004.  Marland Kitchen Noncompliance    Past Surgical History:  Past Surgical History  Procedure Laterality Date  . Coronary artery bypass graft      2004   HPI:  [name] to the  hospital with a left basal ganglia infarct.  He has a hx of right brain stroke, CAD, DM, HTN.  He lives with a roommate and I have no information as to prior functional status.  At the time of admission he displayed right sided weakness, expressive aphasia and left gaze preference.    Assessment / Plan / Recommendation Clinical Impression  Pt presents with severe global aphasia with only a couple of spontaneous utterances heard (ok, yeah). He was able to count in unison with SLP from 1-3 one time, but could not do again.  Pt did not attempt to gesture to communicate, but nodded his head frequently. Pt with a "yes" preference and yes/no responses to simple questions were inconsistent (Are we in a church? nods yes, Are you a woman? nod yes). Pt unable to follow 1-step commands without a model. Pt will need ongoing skilled speech/language intervention to maximize functional recovery and increase independence with communication.    SLP Assessment  Patient needs continued Speech Lanaguage Pathology Services    Follow Up Recommendations  Skilled Nursing facility;Home health SLP;24 hour supervision/assistance    Frequency and Duration min 2x/week  1 week   Pertinent Vitals/Pain    SLP Goals  SLP Goals Potential to Achieve Goals: Fair Potential Considerations: Severity of impairments;Ability to learn/carryover information  SLP Evaluation Prior Functioning  Cognitive/Linguistic Baseline: Information not  available Type of Home: House  Lives With: Family   Cognition  Overall Cognitive Status: Impaired/Different from baseline Arousal/Alertness: Awake/alert Orientation Level: Disoriented to place Problem Solving: Impaired Problem Solving Impairment: Verbal basic Executive Function: Decision Making Decision Making: Impaired Decision Making Impairment: Verbal basic    Comprehension  Auditory Comprehension Overall Auditory Comprehension: Impaired Yes/No Questions: Impaired Basic  Biographical Questions: 26-50% accurate Basic Immediate Environment Questions: 25-49% accurate Commands: Impaired One Step Basic Commands: 0-24% accurate Conversation: Simple Interfering Components: Processing speed;Motor planning EffectiveTechniques: Repetition;Stressing words;Visual/Gestural cues;Pausing;Extra processing time Visual Recognition/Discrimination Discrimination: Not tested Reading Comprehension Reading Status: Not tested    Expression Expression Primary Mode of Expression: Nonverbal - gestures Verbal Expression Overall Verbal Expression: Impaired Initiation: Impaired Automatic Speech: Name;Social Response;Counting (Able to repeat "1, 2, 3" x1) Level of Generative/Spontaneous Verbalization: Word (spontaneous- stereotypical) Repetition: Impaired Level of Impairment: Word level Naming: Impairment Responsive: 0-25% accurate Confrontation: Impaired Convergent: 0-24% accurate Divergent: 0-24% accurate Pragmatics: Unable to assess Effective Techniques:  (To be determined, still very inconsistent) Non-Verbal Means of Communication:  (need to assess picture/symbols) Other Verbal Expression Comments: Severe expressive aphasia Written Expression Dominant Hand: Right Written Expression: Not tested   Oral / Motor Oral Motor/Sensory Function Labial ROM: Reduced right Motor Speech Overall Motor Speech: Impaired Respiration: Within functional limits Phonation: Aphonic Articulation: Impaired Level of Impairment: Word Intelligibility: Intelligibility reduced Word: 25-49% accurate Motor Planning: Impaired Level of Impairment: Word Interfering Components: Inadequate dentition   GO     PORTER,DABNEY 03/12/2013, 10:42 PM

## 2013-03-12 NOTE — Progress Notes (Signed)
[facility] PROGRESS NOTE  [name] [id number] DOB: 1945-06-03 DOA: 03/11/2013 PCP: Carolee Rota, NP   Brief narrative 68 year old male with past medical history of coronary atherosclerosis of native coronary artery, Type 2 diabetes mellitus, Hyperlipidemia; Essential hypertension, benign, Atypical atrial flutter, Ischemic cardiomyopathy, Aortic stenosis, Low TSH level,  poor social situation, hx of cerebral hemorrhage, and  and Noncompliance presented to the ED with chief complaint of weakness of right side and inability to speak since 1 day with findings of bilateral stroke on MRI.  Assessment/Plan:  Acute stroke  Patient has bilateral stroke on head CT and is aphasic with some right sided weakness on exam.  Continue to monitor in ICU. MRI of the brain , MRA head pending. ( unable to get it as he is still requiring cardizem drip for A fib) Patient recently had a 2-D echo showed low normal EF with hypokinesis of basal inferior lateral myocardium and grade 2 diastolic dysfunction.. Given multiple areas of infarct may need TEE but since he is not a candidate for any anticoagulation or intervention with hx of non compliance will hold off on TEE.  -Continue with full dose aspirin.  -Carotid Doppler showing extensive amount of bilateral atherosclerotic plaque.  CT angio of the neck again shows extensive carotid stenosis bilaterally. Will discuss results with neurology..  - neurology consult pending.  -Continue Lipitor  -swallow, speech , PT/ OT eval.  -cardiac diet  A. fib with RVR  Patient still has a rapid A. fib despite high dose of Cardizem and when necessary IV Lopressor. Appreciate cardiology consult and given loading dose of IV digoxin. If unimproved plan on starting him on IV amiodarone.  Diabetes mellitus  . Continue on sliding scale insulin  A1C of 6.6   Hypertension  Allow permissive for tension  Left  upper lobe long nodule CT angio neck shows an enlarging  LUL lung nodule. ( last seen in 2012 with size of 2.4 x 1 .5 cm), given underlying COPD high likely hood for lung ca.  Will obtain CT chest with contrast to assess further.   bacteremia 1/2 blood cx on admission growing GPC. Marland Kitchen Possibly contaminant. Will place on empiric vancomycin . repeat blood culture  Code Status: full  Family Communication: None at bedside. Will try to reach the sister again today.  Disposition Plan: Pending   Consultants:  Neurology cardiology  Procedures:  None   Antibiotics:  None HPI/Subjective:  Patient seen and examined this morning. He is still  aphesic and responds to simple commands only.  Objective: Filed Vitals:   03/12/13 1200  BP:   Pulse: 156  Temp: 98.2 F (36.8 C)  Resp: 21    Intake/Output Summary (Last 24 hours) at 03/12/13 1244 Last data filed at 03/11/13 2000  Gross per 24 hour  Intake 1116.25 ml  Output      0 ml  Net 1116.25 ml   Filed Weights   03/11/13 0209 03/11/13 0424 03/12/13 0500  Weight: 70.761 kg (156 lb) 72.2 kg (159 lb 2.8 oz) 73.7 kg (162 lb 7.7 oz)    Exam:  General: Elderly male lying in bed in no acute distress  HEENT: No pallor, moist oral mucosa,  Chest: clear breath sounds bilaterally  Cardiovascular: S1 and S2 irregularly irregular  Abdomen: Soft, nontender, nondistended, bowel sounds present  Musculoskeletal: Warm, no edema  CNS: aphasic, responds to simple commands, right extremity 4+/5 power   Data Reviewed: Basic Metabolic Panel:  Recent Labs Lab 03/11/13 0135  NA 139  K 3.9  CL 102  CO2 23  GLUCOSE 174*  BUN 16  CREATININE 1.10  CALCIUM 9.5   Liver Function Tests:  Recent Labs Lab 03/11/13 0135  AST 12  ALT 10  ALKPHOS 96  BILITOT 0.6  PROT 7.5  ALBUMIN 3.5   No results found for this basename: LIPASE, AMYLASE,  in the last 168 hours No results found for this basename: AMMONIA,  in the last 168 hours CBC:  Recent Labs Lab 03/11/13 0135  WBC 7.1  NEUTROABS 5.4   HGB 14.3  HCT 43.9  MCV 82.7  PLT 170   Cardiac Enzymes:  Recent Labs Lab 03/11/13 0135  TROPONINI <0.30   BNP (last 3 results)  Recent Labs  06/05/12 0459 11/01/12 0056 11/04/12 2131  PROBNP 4068.0* 7093.0* 6884.0*   CBG:  Recent Labs Lab 03/11/13 1110 03/11/13 1634 03/11/13 2154 03/12/13 0853 03/12/13 1123  GLUCAP 100* 128* 120* 224* 192*    Recent Results (from the past 240 hour(s))  CULTURE, BLOOD (ROUTINE X 2)     Status: None   Collection Time    03/11/13  2:13 AM      Result Value Ref Range Status   Specimen Description BLOOD RIGHT ANTECUBITAL   Final   Special Requests     Final   Value: BOTTLES DRAWN AEROBIC AND ANAEROBIC AEB=4CC ANA=6CC   Culture NO GROWTH 1 DAY   Final   Report Status PENDING   Incomplete  CULTURE, BLOOD (ROUTINE X 2)     Status: None   Collection Time    03/11/13  2:13 AM      Result Value Ref Range Status   Specimen Description BLOOD RIGHT HAND   Final   Special Requests BOTTLES DRAWN AEROBIC AND ANAEROBIC 5CC EACH   Final   Culture     Final   Value: GRAM POSITIVE COCCI IN CLUSTERS     Gram Stain Report Called to,Read Back By and Verified With: MCDANIEL M AT 0040 ON [id number] BY FORSYTH K     Performed at [facility]   Report Status PENDING   Incomplete  MRSA PCR SCREENING     Status: None   Collection Time    03/11/13  4:16 AM      Result Value Ref Range Status   MRSA by PCR NEGATIVE  NEGATIVE Final   Comment:            The GeneXpert MRSA Assay (FDA     approved for NASAL specimens     only), is one component of a     comprehensive MRSA colonization     surveillance program. It is not     intended to diagnose MRSA     infection nor to guide or     monitor treatment for     MRSA infections.  CULTURE, BLOOD (ROUTINE X 2)     Status: None   Collection Time    03/12/13  8:30 AM      Result Value Ref Range Status   Specimen Description Blood   Final   Special Requests Normal   Final   Culture NO GROWTH <24  HRS   Final   Report Status PENDING   Incomplete  CULTURE, BLOOD (ROUTINE X 2)     Status: None   Collection Time    03/12/13  8:30 AM      Result Value Ref Range Status   Specimen Description Blood   Final  Special Requests Normal   Final   Culture NO GROWTH <24 HRS   Final   Report Status PENDING   Incomplete     Studies: Dg Chest 1 View  03/11/2013   CLINICAL DATA Right extremity weakness.  EXAM CHEST - 1 VIEW  COMPARISON November 04, 2012.  FINDINGS Stable cardiomegaly. Status post coronary artery bypass graft. Old right rib fractures are noted. No pneumothorax or pleural effusion is noted. No acute pulmonary disease is noted.  IMPRESSION No acute cardiopulmonary abnormality seen.  SIGNATURE  Electronically Signed   By: Sabino Dick M.D.   On: 03/11/2013 02:44   Ct Head Wo Contrast  03/11/2013   CLINICAL DATA Right arm weakness, expressive aphasia  EXAM CT HEAD WITHOUT CONTRAST  TECHNIQUE Contiguous axial images were obtained from the base of the skull through the vertex without intravenous contrast.  COMPARISON 06/02/2012  FINDINGS Subtle left basal ganglia hypodensity and loss of left insular ribbon series 2, image 18. Right coronal radiata hypodensity is similar and may reflect sequelae of a prior lacunar infarction. Multiple other scattered areas of subcortical and periventricular white matter hypodensity may reflect chronic microangiopathic change. No intraparenchymal hemorrhage, mass, mass effect, or abnormal extra-axial fluid collection. No hydrocephalus. Atherosclerotic vascular calcifications. Paranasal sinuses and mastoid air cells are predominantly clear.  IMPRESSION Left basal ganglia hypodensity and loss of left insular ribbon may reflect an acute infarction.  Right corona radiata remote lacunar infarction and multifocal white matter hypodensities, similar to prior.  Critical Value/emergent results were called by telephone at the time of interpretation on 03/11/2013 at 1:59 AM to Dr.  Jola Schmidt , who verbally acknowledged these results.  SIGNATURE  Electronically Signed   By: Carlos Levering M.D.   On: 03/11/2013 02:02   Ct Angio Neck W/cm &/or Wo/cm  03/12/2013   CLINICAL DATA:  Abnormal carotid Doppler study with extensive irregular atherosclerotic plaque bilaterally but no definite hemodynamically significant stenosis. Stroke.  EXAM: CT ANGIOGRAPHY NECK  TECHNIQUE: Multidetector CT imaging of the neck was performed using the standard protocol during bolus administration of intravenous contrast. Multiplanar CT image reconstructions and MIPs were obtained to evaluate the vascular anatomy. Carotid stenosis measurements (when applicable) are obtained utilizing NASCET criteria, using the distal internal carotid diameter as the denominator.  CONTRAST:  32mL OMNIPAQUE IOHEXOL 350 MG/ML SOLN  COMPARISON:  US CAROTID DUPLEX BILAT dated 03/11/2013; CT HEAD W/O CM dated 03/11/2013; CT C SPINE W/O CM dated 06/02/2012; CT CHEST W/CM dated 08/25/2010  FINDINGS: There is a common origin of the left common carotid artery and the innominate artery. Dense calcific plaque is present in the proximal left subclavian artery, narrowing the lumen to 2.9 mm. The more distal left subclavian artery measures 8.8 mm.  The vertebral arteries originate from the subclavian arteries bilaterally. Calcifications are present adjacent to the vertebral artery ostia without significant stenosis. There are scattered calcifications throughout cervical vertebral arteries. The lumen is narrowed to 1.3 mm on the right at C5. This compares with the more normal distal vessel measuring 3.4 mm at the level of C2. Calcifications are present in the left vertebral artery without significant canal compromise.  Calcifications are present along the right common carotid artery. There is a dense calcification of the level of C5 which narrows the lumen the 3.1 mm. This compares with the more distal common carotid measurement of 7.6 mm proximal to  the bifurcation.  A high-grade calcific stenosis is present at the carotid bifurcation. The lumen is narrowed to  1.5 mm and this compares with more distal luminal measurements of 6.1 mm.  Atherosclerotic calcifications are noted along the left common carotid artery without a significant stenosis. There are additional calcifications at the left carotid bifurcation without a significant stenosis relative to the distal vessel.  Atherosclerotic calcifications are present within the cavernous carotid arteries bilaterally without definite stenosis. Limited imaging of the brain is unremarkable.  The soft tissues of the neck are otherwise unremarkable. A nodular density in the medial left upper lobe demonstrates interval increase in size since 2012. The lesion now measures 25 x 22 x 20 mm. No other focal nodule, mass, or airspace disease is present. There is no significant adenopathy.  Review of the MIP images confirms the above findings.  IMPRESSION: 1. Extensive calcified atherosclerotic changes throughout the neck. 2. 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 3. 60% stenosis of the right vertebral artery at the level of C5. Scattered calcifications are present throughout the vertebral arteries without other significant stenoses. 4. 60% stenosis of the mid right common carotid artery at the level of C5. 5. 75% stenosis of the proximal right internal carotid artery with dense atherosclerotic calcifications. 6. Enlarging 2.5 cm left upper lobe lung nodule. Recommend CT the chest with contrast for further evaluation. PET scanning may be useful as well depending on findings from the remainder of the chest. These results will be called to the ordering clinician or representative by the Radiologist Assistant, and communication documented in the PACS Dashboard.   Electronically Signed   By: Lawrence Santiago M.D.   On: 03/12/2013 10:39   US Carotid Duplex Bilateral  03/11/2013    CLINICAL DATA CVA, hypertension  EXAM BILATERAL CAROTID DUPLEX ULTRASOUND  TECHNIQUE Gray scale imaging, color Doppler and duplex ultrasound were performed of bilateral carotid and vertebral arteries in the neck.  COMPARISON CT HEAD W/O CM dated 03/11/2013  FINDINGS Criteria: Quantification of carotid stenosis is based on velocity parameters that correlate the residual internal carotid diameter with NASCET-based stenosis levels, using the diameter of the distal internal carotid lumen as the denominator for stenosis measurement.  The following velocity measurements were obtained:  RIGHT  ICA:  109/33 cm/sec  CCA:  [id number] cm/sec  SYSTOLIC ICA/CCA RATIO:  2.0  DIASTOLIC ICA/CCA RATIO:  3.4  ECA:  72 cm/sec  LEFT  ICA:  59/14 cm/sec  CCA:  99991111 cm/sec  SYSTOLIC ICA/CCA RATIO:  1.1  DIASTOLIC ICA/CCA RATIO:  1.1  ECA:  112 cm/sec  RIGHT CAROTID ARTERY: There is rather large amount of the eccentric echogenic partially shadowing plaque within the mid aspect of the right common carotid artery (representative image 7) extending to involve the distal aspects of the right common carotid artery (image 11). There is eccentric echogenic partially shadowing plaque within the right carotid bulb (images 14 and 16) extending to involve the origin and proximal aspect of the right internal carotid artery (image 24), not definitely resulting in elevated peak systolic velocities within the interrogated course of the right internal carotid artery to suggest a hemodynamically significant stenosis.  RIGHT VERTEBRAL ARTERY:  Antegrade flow  LEFT CAROTID ARTERY: There is eccentric echogenic partially shadowing plaque within the mid and distal aspects of the left common carotid artery (representative images 42, 45 and 46). There is eccentric mixed echogenic partially shadowing plaque within the left carotid bulb (images 50 and 52), extending to involve the origin and proximal aspect of the left internal carotid artery (image 61),  not resulting  in elevated peak systolic velocities within the interrogated course of the left internal carotid artery to suggest a hemodynamically significant stenosis.  LEFT VERTEBRAL ARTERY:  Antegrade flow  IMPRESSION Rather extensive amount of irregular atherosclerotic plaque bilaterally, not definitely resulting in hemodynamically significant stenosis though could serve as a source of distal embolism. Further evaluation could be performed with CTA as clinically indicated.  SIGNATURE  Electronically Signed   By: Sandi Mariscal M.D.   On: 03/11/2013 16:55    Scheduled Meds: . aspirin  300 mg Rectal Daily   Or  . aspirin  325 mg Oral Daily  . atorvastatin  80 mg Oral q1800  . digoxin  0.25 mg Intravenous Q6H  . digoxin  0.5 mg Intravenous Once  . enoxaparin (LOVENOX) injection  40 mg Subcutaneous Q24H  . insulin aspart  0-9 Units Subcutaneous TID WC  . sodium chloride      . vancomycin  1,000 mg Intravenous Q12H   Continuous Infusions: . sodium chloride 75 mL/hr at 03/11/13 2000  . diltiazem (CARDIZEM) infusion 15 mg/hr (03/12/13 0845)      Time spent: 35 minutes    Renezmae Canlas  [facility] Pager [phone]. If 7PM-7AM, please contact night-coverage at [email or web address], password [facility] 03/12/2013, 12:44 PM  LOS: 1 day

## 2013-03-12 NOTE — Progress Notes (Signed)
ANTIBIOTIC CONSULT NOTE-Preliminary  Pharmacy Consult for Vancomycin Indication: Bacteremia  Allergies  Allergen Reactions  . Benadryl [Diphenhydramine Hcl] Other (See Comments)    Hot Flashes "Like Niaspan"    Patient Measurements: Height: 5\' 10"  (177.8 cm) Weight: 159 lb 2.8 oz (72.2 kg) IBW/kg (Calculated) : 73  Vital Signs: Temp: 98 F (36.7 C) (03/12 0000) Temp src: Axillary (03/12 0000) BP: 127/87 mmHg (03/12 0000) Pulse Rate: 84 (03/11 1700)  Labs:  Recent Labs  03/11/13 0135  WBC 7.1  HGB 14.3  PLT 170  CREATININE 1.10    Estimated Creatinine Clearance: 64.7 ml/min (by C-G formula based on Cr of 1.1).  No results found for this basename: VANCOTROUGH, Corlis Leak, VANCORANDOM, GENTTROUGH, GENTPEAK, GENTRANDOM, TOBRATROUGH, TOBRAPEAK, TOBRARND, AMIKACINPEAK, AMIKACINTROU, AMIKACIN,  in the last 72 hours   Microbiology: Recent Results (from the past 720 hour(s))  MRSA PCR SCREENING     Status: None   Collection Time    03/01/13  6:00 PM      Result Value Ref Range Status   MRSA by PCR NEGATIVE  NEGATIVE Final   Comment:            The GeneXpert MRSA Assay (FDA     approved for NASAL specimens     only), is one component of a     comprehensive MRSA colonization     surveillance program. It is not     intended to diagnose MRSA     infection nor to guide or     monitor treatment for     MRSA infections.  CULTURE, BLOOD (ROUTINE X 2)     Status: None   Collection Time    03/11/13  2:13 AM      Result Value Ref Range Status   Specimen Description BLOOD RIGHT ANTECUBITAL   Final   Special Requests     Final   Value: BOTTLES DRAWN AEROBIC AND ANAEROBIC AEB 4CC ANA 6CC   Culture NO GROWTH <24 HRS   Final   Report Status PENDING   Incomplete  CULTURE, BLOOD (ROUTINE X 2)     Status: None   Collection Time    03/11/13  2:13 AM      Result Value Ref Range Status   Specimen Description BLOOD RIGHT HAND   Final   Special Requests BOTTLES DRAWN AEROBIC AND  ANAEROBIC 5CC EACH   Final   Culture     Final   Value: GRAM POSITIVE COCCI IN CLUSTERS     Gram Stain Report Called to,Read Back By and Verified With: MCDANIEL M AT 0040 ON [id number] BY FORSYTH K   Report Status PENDING   Incomplete  MRSA PCR SCREENING     Status: None   Collection Time    03/11/13  4:16 AM      Result Value Ref Range Status   MRSA by PCR NEGATIVE  NEGATIVE Final   Comment:            The GeneXpert MRSA Assay (FDA     approved for NASAL specimens     only), is one component of a     comprehensive MRSA colonization     surveillance program. It is not     intended to diagnose MRSA     infection nor to guide or     monitor treatment for     MRSA infections.    Medical History: Past Medical History  Diagnosis Date  . Coronary atherosclerosis of native coronary artery  a. CABG in 2004 - LIMA to LAD, SVG to diag, SVG to OM, SVG to PDA. EF was 40%. b. Cath 2006 s/p DES to prox RCA.  . Type 2 diabetes mellitus   . Hyperlipidemia   . Essential hypertension, benign   . Atypical atrial flutter     a. Dx 06/2012. b. Not a candidate for anticoag due to fall, tenuous social situation.  . Ischemic cardiomyopathy     a. EF 40% in 2004. b. EF 55% in 06/2012.  Marland Kitchen Aortic stenosis     a. By echo 06/2012, very calcified but mean gradient 60mmHg - will need clinical f/u and serial echoes.  . Low TSH level     a. 06/2012, f/u pcp.  Marland Kitchen Poor social situation   . Cerebral hemorrhage     a. At time of CABG - had post-op encephalopathy and small frontal hemorrhage in 2004.  Marland Kitchen Noncompliance     Medications:   Assessment: 68 yo male admitted 03/11/13 with stroke and PMH sig for DM, hypertension, CVA, and atrial flutter. Blood cultures drawn on admission now positive for gram positive cocci in clusters.   Goal of Therapy:  Vancomycin troughs 15-20 mcg/ml  Plan:  Preliminary review of pertinent patient information completed.  Protocol will be initiated with a one-time dose of  Vancomycin 1 Gm IV.  Forestine Na clinical pharmacist will complete review during morning rounds to assess patient and finalize treatment regimen.  Norberto Sorenson, [facility] 03/12/2013,1:31 AM

## 2013-03-12 NOTE — Progress Notes (Signed)
Patient lives with Neill Loft and her husband. They have been calling daily to ask about him. Rip Harbour asks that she be notified if he moves to rehab. They are willing to have him return to their home.

## 2013-03-12 NOTE — Consult Note (Signed)
Cardiology attending Note  Case discussed and seen with NP Purcell Nails. 68 yo male hx of CAD with prior CABG, DM2, HL, aflutter, medical non-compliance, admitted with CVA. He did not receive TPA due to prior hx of intracerebral bleed. He had previously not been on anticoag due to hx of head bleed, non-compliance, and frequent falls per chart notes. We are consulted for assistance with afib management. Patient is on high dose dilt drip w/ prn metoprolol still with elevated afib rates. Will stop metoprolol, load with IV digoxin and follow rates. If fails, next step would be IV amio   Carlyle Dolly MD

## 2013-03-13 ENCOUNTER — Inpatient Hospital Stay (HOSPITAL_COMMUNITY): Payer: PRIVATE HEALTH INSURANCE

## 2013-03-13 DIAGNOSIS — I712 Thoracic aortic aneurysm, without rupture, unspecified: Secondary | ICD-10-CM | POA: Diagnosis present

## 2013-03-13 DIAGNOSIS — R918 Other nonspecific abnormal finding of lung field: Secondary | ICD-10-CM | POA: Diagnosis present

## 2013-03-13 DIAGNOSIS — R911 Solitary pulmonary nodule: Secondary | ICD-10-CM

## 2013-03-13 DIAGNOSIS — E278 Other specified disorders of adrenal gland: Secondary | ICD-10-CM | POA: Diagnosis present

## 2013-03-13 DIAGNOSIS — E279 Disorder of adrenal gland, unspecified: Secondary | ICD-10-CM

## 2013-03-13 LAB — GLUCOSE, CAPILLARY
GLUCOSE-CAPILLARY: 110 mg/dL — AB (ref 70–99)
Glucose-Capillary: 142 mg/dL — ABNORMAL HIGH (ref 70–99)
Glucose-Capillary: 183 mg/dL — ABNORMAL HIGH (ref 70–99)
Glucose-Capillary: 218 mg/dL — ABNORMAL HIGH (ref 70–99)

## 2013-03-13 LAB — BASIC METABOLIC PANEL
BUN: 12 mg/dL (ref 6–23)
CALCIUM: 9.2 mg/dL (ref 8.4–10.5)
CO2: 21 mEq/L (ref 19–32)
Chloride: 104 mEq/L (ref 96–112)
Creatinine, Ser: 0.91 mg/dL (ref 0.50–1.35)
GFR calc non Af Amer: 84 mL/min — ABNORMAL LOW (ref >90)
Glucose, Bld: 150 mg/dL — ABNORMAL HIGH (ref 70–99)
Potassium: 3.4 mEq/L — ABNORMAL LOW (ref 3.7–5.3)
Sodium: 141 mEq/L (ref 137–147)

## 2013-03-13 LAB — CULTURE, BLOOD (ROUTINE X 2)

## 2013-03-13 MED ORDER — DILTIAZEM HCL 100 MG IV SOLR
5.0000 mg/h | INTRAVENOUS | Status: DC
  Administered 2013-03-13: 5 mg/h via INTRAVENOUS
  Administered 2013-03-14 (×2): 15 mg/h via INTRAVENOUS
  Filled 2013-03-13: qty 100

## 2013-03-13 MED ORDER — POTASSIUM CHLORIDE CRYS ER 20 MEQ PO TBCR
40.0000 meq | EXTENDED_RELEASE_TABLET | Freq: Once | ORAL | Status: AC
  Administered 2013-03-13: 40 meq via ORAL
  Filled 2013-03-13: qty 2

## 2013-03-13 MED ORDER — DILTIAZEM HCL 30 MG PO TABS
30.0000 mg | ORAL_TABLET | Freq: Four times a day (QID) | ORAL | Status: DC
  Administered 2013-03-13 – 2013-03-14 (×6): 30 mg via ORAL
  Filled 2013-03-13 (×6): qty 1

## 2013-03-13 MED ORDER — IOHEXOL 300 MG/ML  SOLN
80.0000 mL | Freq: Once | INTRAMUSCULAR | Status: AC | PRN
  Administered 2013-03-13: 80 mL via INTRAVENOUS

## 2013-03-13 MED ORDER — METOPROLOL SUCCINATE ER 50 MG PO TB24
50.0000 mg | ORAL_TABLET | Freq: Every day | ORAL | Status: DC
  Administered 2013-03-13 – 2013-03-14 (×2): 50 mg via ORAL
  Filled 2013-03-13 (×2): qty 1

## 2013-03-13 MED ORDER — METOPROLOL TARTRATE 1 MG/ML IV SOLN
5.0000 mg | Freq: Four times a day (QID) | INTRAVENOUS | Status: DC
  Administered 2013-03-13 – 2013-03-14 (×3): 5 mg via INTRAVENOUS
  Filled 2013-03-13 (×4): qty 5

## 2013-03-13 MED ORDER — GLUCERNA SHAKE PO LIQD
237.0000 mL | Freq: Two times a day (BID) | ORAL | Status: DC
  Administered 2013-03-13 – 2013-03-17 (×6): 237 mL via ORAL

## 2013-03-13 NOTE — Progress Notes (Signed)
Physical Therapy Treatment Patient Details Name: [name] MRN: [id number] DOB: 06/22/1945 Today's Date: 03/13/2013 Time: 1071-2524 PT Time Calculation (min): 28 min  PT Assessment / Plan / Recommendation  History of Present Illness Pt does not admit to any problems   PT Comments    Pt continues to be unable to speak but was able to say "yeah".  He appears to be understanding all speech.  He is incontinent of urine.  Balance and gait were assessed and found to be WNL.  His gait endurance was only fair as he indicated that he wanted to return to his room.  It was unclear as to why.  I am recommending HHPT to ensure safety in the home setting.  He should not need any DME  Follow Up Recommendations  Homehealth PT                Equipment Recommendations  None recommended by PT          Progress towards PT Goals Goals met...d/c PT  Plan Discharge plan needs to be updated    Precautions / Restrictions Precautions Precautions: None Restrictions Weight Bearing Restrictions: No       Mobility  Bed Mobility Overal bed mobility: Independent Transfers Overall transfer level: Independent Sit to Stand: Independent Ambulation/Gait Ambulation/Gait assistance: Modified independent (Device/Increase time) Ambulation Distance (Feet): 80 Feet Assistive device: None Gait Pattern/deviations: WFL(Within Functional Limits) Gait velocity interpretation: at or above normal speed for age/gender General Gait Details: pt could have walked further but indicated that he wanted to turn around...he was unable to communicate what the reason was and denied pain, SOB              PT Goals (current goals can now be found in the care plan section)    Visit Information  Last PT Received On: 03/13/13 History of Present Illness: Pt does not admit to any problems         Cognition  Cognition Arousal/Alertness: Awake/alert Behavior During Therapy: WFL for tasks assessed/performed Overall  Cognitive Status: Within Functional Limits for tasks assessed    Balance  Balance Sitting balance-Leahy Scale: Normal Standing balance support: No upper extremity supported Standing balance-Leahy Scale: Good  End of Session PT - End of Session Equipment Utilized During Treatment: Gait belt Activity Tolerance: Patient tolerated treatment well Patient left: in bed;with call bell/phone within reach;with family/visitor present Nurse Communication: Mobility status   GP     [name] 03/13/2013, 1:34 PM

## 2013-03-13 NOTE — Progress Notes (Signed)
INITIAL NUTRITION ASSESSMENT  DOCUMENTATION CODES Per approved criteria  -Not Applicable   INTERVENTION: Provide Glucerna BID between meals, each container provides 220 kcals and 10 grams of protein.  NUTRITION DIAGNOSIS: Inadequate oral intake related to decreased appetite as evidenced by meal completion 50%.   Goal: Pt to meet >/= 90% of their estimated nutrition needs.  Monitor:  PO & supplement intake, weight trends, labs, I/O's  Reason for Assessment: MST  68 y.o. male  Admitting Dx: Stroke  ASSESSMENT: 67 year old male who has a past medical history of Coronary atherosclerosis of native coronary artery; Type 2 diabetes mellitus; Hyperlipidemia; Essential hypertension, benign; Atypical atrial flutter; Ischemic cardiomyopathy; Aortic stenosis; Low TSH level; Poor social situation; Cerebral hemorrhage; and Noncompliance. Presented to the ED with chief complaint of weakness of right side and inability to speak. In the ED, CT scan of the head was done which showed CVA.  Pt reports having a good appetite at home with 3 full meals a day. Currently pt reports having a decreased appetite, which has started a couple of days ago. Meal completion is 50%. Pt reports drinking glucerna at home and is willing to drink it while hospitalized-- will order. Pt reports no recent weight loss and is unsure of usual body weight. Per [facility] weight records, pt weight has increased since 3/02.   Nutrition Focused Physical Exam:  Subcutaneous Fat:  Orbital Region: N/A Upper Arm Region: WNL (noted decreased muscle mass) Thoracic and Lumbar Region: WNL  Muscle:  Temple Region: Severe depletion Clavicle Bone Region: Moderate depletion Clavicle and Acromion Bone Region: Moderate depletion Scapular Bone Region: N/A Dorsal Hand: WNL Patellar Region: WNL  Anterior Thigh Region: WNL Posterior Calf Region: WNL  Edema: Lower extremity edema  Labs: Low potassium (repleted with potassium chloride) and  GFR High Glucose (150 mg/dL)  Height: Ht Readings from Last 1 Encounters:  03/11/13 5\' 10"  (1.778 m)    Weight: Wt Readings from Last 1 Encounters:  03/13/13 171 lb 1.2 oz (77.6 kg)    Ideal Body Weight: 166 lb  % Ideal Body Weight: 103%  Wt Readings from Last 10 Encounters:  03/13/13 171 lb 1.2 oz (77.6 kg)  03/02/13 156 lb 15.5 oz (71.2 kg)  11/04/12 162 lb (73.483 kg)  11/02/12 162 lb 0.6 oz (73.5 kg)  06/06/12 164 lb (74.39 kg)  06/04/12 154 lb 9.6 oz (70.126 kg)  12/17/10 183 lb (83.008 kg)  11/16/10 182 lb (82.555 kg)  10/23/10 185 lb (83.915 kg)  08/25/10 187 lb (84.823 kg)    Usual Body Weight: 180 lb  % Usual Body Weight: 95%  BMI:  Body mass index is 24.55 kg/(m^2).  Estimated Nutritional Needs: Kcal: 1900-2100 Protein: 95-105 grams Fluid: 1.9 L- 2.1 L/day  Skin: Lower extremity edema, diabetic ulcer on left buttocks, diabetic ulcer on coccyx  Diet Order: Dysphagia 3 heart healthy  EDUCATION NEEDS: -Education not appropriate at this time   Intake/Output Summary (Last 24 hours) at 03/13/13 1052 Last data filed at 03/13/13 0900  Gross per 24 hour  Intake 3353.75 ml  Output      0 ml  Net 3353.75 ml    Last BM: 3/12   Labs:   Recent Labs Lab 03/11/13 0135 03/13/13 0550  NA 139 141  K 3.9 3.4*  CL 102 104  CO2 23 21  BUN 16 12  CREATININE 1.10 0.91  CALCIUM 9.5 9.2  GLUCOSE 174* 150*    CBG (last 3)   Recent Labs  03/12/13 1637  03/12/13 2236 03/13/13 0727  GLUCAP 130* 166* 142*    Scheduled Meds: . aspirin  300 mg Rectal Daily   Or  . aspirin  325 mg Oral Daily  . atorvastatin  80 mg Oral q1800  . diltiazem  30 mg Oral QID  . enoxaparin (LOVENOX) injection  40 mg Subcutaneous Q24H  . insulin aspart  0-9 Units Subcutaneous TID WC  . metoprolol succinate  50 mg Oral Daily  . vancomycin  1,000 mg Intravenous Q12H    Continuous Infusions: . diltiazem (CARDIZEM) infusion 15 mg/hr (03/13/13 0700)    Past Medical  History  Diagnosis Date  . Coronary atherosclerosis of native coronary artery     a. CABG in 2004 - LIMA to LAD, SVG to diag, SVG to OM, SVG to PDA. EF was 40%. b. Cath 2006 s/p DES to prox RCA.  . Type 2 diabetes mellitus   . Hyperlipidemia   . Essential hypertension, benign   . Atypical atrial flutter     a. Dx 06/2012. b. Not a candidate for anticoag due to fall, tenuous social situation.  . Ischemic cardiomyopathy     a. EF 40% in 2004. b. EF 55% in 06/2012.  Marland Kitchen Aortic stenosis     a. By echo 06/2012, very calcified but mean gradient 57mmHg - will need clinical f/u and serial echoes.  . Low TSH level     a. 06/2012, f/u pcp.  Marland Kitchen Poor social situation   . Cerebral hemorrhage     a. At time of CABG - had post-op encephalopathy and small frontal hemorrhage in 2004.  Marland Kitchen Noncompliance     Past Surgical History  Procedure Laterality Date  . Coronary artery bypass graft      2004    [facility] Dietetic Intern Pager: [phone]

## 2013-03-13 NOTE — Progress Notes (Signed)
Patient ID: [name], male   DOB: September 02, 1945, 68 y.o.   MRN: [id number]     Subjective:    No events overnight  Objective:   Temp:  [97.6 F (36.4 C)-98.3 F (36.8 C)] 98.3 F (36.8 C) (03/13 0730) Pulse Rate:  [55-156] 88 (03/13 0400) Resp:  [20-34] 25 (03/13 0615) BP: (123-168)/(45-119) 148/92 mmHg (03/13 0615) SpO2:  [83 %-100 %] 100 % (03/13 0400) Weight:  [171 lb 1.2 oz (77.6 kg)] 171 lb 1.2 oz (77.6 kg) (03/13 0500) Last BM Date: 03/12/13  Filed Weights   03/11/13 0424 03/12/13 0500 03/13/13 0500  Weight: 159 lb 2.8 oz (72.2 kg) 162 lb 7.7 oz (73.7 kg) 171 lb 1.2 oz (77.6 kg)    Intake/Output Summary (Last 24 hours) at 03/13/13 0915 Last data filed at 03/13/13 0700  Gross per 24 hour  Intake 3113.75 ml  Output      0 ml  Net 3113.75 ml    Telemetry:Aflutter, bigeminy, 5 beat run of NSVT, aflutter  Exam:  General: NAD  Resp: Clear anteriorally  Cardiac: irreg, rate 110, no m/r/g, no JVD  GI: abdomen soft, NT, ND  MSK: lower extremities are warm, no edema    Lab Results:  Basic Metabolic Panel:  Recent Labs Lab 03/11/13 0135 03/13/13 0550  NA 139 141  K 3.9 3.4*  CL 102 104  CO2 23 21  GLUCOSE 174* 150*  BUN 16 12  CREATININE 1.10 0.91  CALCIUM 9.5 9.2    Liver Function Tests:  Recent Labs Lab 03/11/13 0135  AST 12  ALT 10  ALKPHOS 96  BILITOT 0.6  PROT 7.5  ALBUMIN 3.5    CBC:  Recent Labs Lab 03/11/13 0135  WBC 7.1  HGB 14.3  HCT 43.9  MCV 82.7  PLT 170    Cardiac Enzymes:  Recent Labs Lab 03/11/13 0135  TROPONINI <0.30    BNP:  Recent Labs  06/05/12 0459 11/01/12 0056 11/04/12 2131  PROBNP 4068.0* 7093.0* 6884.0*    Coagulation:  Recent Labs Lab 03/11/13 0135  INR 1.12    ECG:   Medications:   Scheduled Medications: . aspirin  300 mg Rectal Daily   Or  . aspirin  325 mg Oral Daily  . atorvastatin  80 mg Oral q1800  . enoxaparin (LOVENOX) injection  40 mg Subcutaneous Q24H  .  insulin aspart  0-9 Units Subcutaneous TID WC  . potassium chloride  40 mEq Oral Once  . vancomycin  1,000 mg Intravenous Q12H     Infusions: . diltiazem (CARDIZEM) infusion 15 mg/hr (03/13/13 0700)     PRN Medications:       Assessment/Plan   1. Afib/Aflutter - elevated rates yesterday on high dose dilt drip, loaded with IV digoxin for total of 1 mg.   - still on IV dilt drip, patient started taking PO this morning - will start dilt 30mg  po q 6 hrs with hold parameters and instuctions to wean dilt gtt - will hold off on starting maintence digoxin, now that back on oral will resume his Toprol XL given secondary benefits       Carlyle Dolly, M.D., F.A.C.C.

## 2013-03-13 NOTE — Care Management Note (Addendum)
    Page 1 of 2   03/18/2013     2:44:59 PM   CARE MANAGEMENT NOTE 03/18/2013  Patient:  [name], [name]   Account Number:  [id number]  Date Initiated:  03/13/2013  Documentation initiated by:  Theophilus Kinds  Subjective/Objective Assessment:   Pt admitted from home with CVA and a fib. Pt lives with a couple that helps care for him. Pt had been fairly independent with ADL's prior to this hospitalization.     Action/Plan:   PT to eval pt for possible need for SNF. CSW is also aware that pt may need rehab at discharge. Will follow for any discharge planning needs.   Anticipated DC Date:  03/16/2013   Anticipated DC Plan:  SKILLED NURSING FACILITY  In-house referral  Clinical Social Worker      DC Forensic scientist  CM consult      [facility] Choice  HOME HEALTH   Choice offered to / List presented to:  C-1 Patient        [place] arranged  HH-1 RN  [place].   Status of service:  Completed, signed off Medicare Important Message given?  YES (If response is "NO", the following Medicare IM given date fields will be blank) Date Medicare IM given:  03/18/2013 Date Additional Medicare IM given:    Discharge Disposition:  [facility]  Per UR Regulation:    If discussed at Long Length of Stay Meetings, dates discussed:   03/17/2013    Comments:  03/18/13 Claretha Cooper RN BSN CM Redressed topic of [facility] RN. Pt was agreeable. Selected [facility] HH RN. Anticipate DC with foley.  03/16/13 [place], RN BSN CM CM spoke with pt about [facility] arrangements. Pt refuses and HH PT services. Pt refuses any HH involvement at discharge. Pt stated that he will return home with his caregivers and does not need any follow and refuses to allow CM to arrange. Will check again at discharge to see if pt changes his mind.  03/13/13 [place], RN BSN CM

## 2013-03-13 NOTE — Progress Notes (Signed)
[facility] PROGRESS NOTE  [name]:[id number] DOB: 08/12/1945 DOA: 03/11/2013 PCP: Carolee Rota, NP   Brief narrative 68 year old male with past medical history of coronary atherosclerosis of native coronary artery, Type 2 diabetes mellitus, Hyperlipidemia; Essential hypertension, benign, Atypical atrial flutter, Ischemic cardiomyopathy, Aortic stenosis, Low TSH level,  poor social situation, hx of cerebral hemorrhage, and  and Noncompliance presented to the ED with chief complaint of weakness of right side and inability to speak since 1 day with findings of bilateral stroke on MRI.  Assessment/Plan:  Acute stroke  Patient has bilateral stroke on head CT and is aphasic. Continue to monitor in ICU. MRI of the brain , MRA head still pending. Will reorder again. Need to check for any brain mets. Patient recently had a 2-D echo showed low normal EF with hypokinesis of basal inferior lateral myocardium and grade 2 diastolic dysfunction.. Given multiple areas of infarct may need TEE but since he is not a candidate for any anticoagulation or intervention will hold off on TEE.  -Continue with full dose aspirin.  -Carotid Doppler showing extensive amount of bilateral atherosclerotic plaque.  CT angio of the neck again shows extensive carotid stenosis bilaterally.  - neurology consult agree with current plan -Continue Lipitor  -swallow, speech , PT/ OT eval.  -cardiac diet  A. fib with RVR  Patient had persistent rapid A. fib despite high dose of Cardizem and when necessary IV Lopressor. Appreciate cardiology consult and given loading dose of IV digoxin.his HR better controlled now and is off  Cardizem drip.contionue oral cardizem.    Diabetes mellitus  . Continue on sliding scale insulin  A1C of 6.6   Hypertension  Allow permissive hypertension  Left  upper lobe long nodule CT angio neck shows an enlarging LUL lung nodule. ( last seen in 2012 with size of 2.4 x 1 .5 cm),  given underlying COPD high likely hood for lung ca.  CT chest shows increase in size of LUL nodule concerning for malignancy. Patient is a heavy smoker and smokes 2PPD at present. Also shows a right adrenal mass concerning for mets. Will need PET  as outpt before deciding for tissue diagnosis. Will get MRI brain to r/o mets.  informed PCP about the findings on 3/13 who will arrange for outpt follow up and PET scan after discharge.    bacteremia 1/2 blood cx on admission growing GPC. Marland Kitchen Possibly contaminant.  D/c abx  Descending thoracic aortic aneurysm CT showed increase in size of descending thoracic aneurysm from 4.3 cm in 2012 to 5.7 cm.  discussed findings  with vascular surgery Dr. Trula Slade also discussed about 75%  stenosis of right carotid artery. He will arrange for outpt follow up in his office uopn discharge.   Code Status: full  Family Communication: discussed with family friend melinda at bedside. Patient has  an estranged  sister with whom he is no longer in contact. He also has 2 sons both of whom are not been contact with him for many years. patient lives with a family friend and his wife.  Disposition Plan: home possibly early next week  Consultants:  Neurology cardiology  Procedures:  None   Antibiotics:  None HPI/Subjective:  Patient seen and examined this morning. He is still  aphesic and responds to commands only.  Objective: Filed Vitals:   03/13/13 1300  BP: 129/74  Pulse:   Temp:   Resp: 23    Intake/Output Summary (Last 24 hours) at 03/13/13 1702 Last data filed  at 03/13/13 1400  Gross per 24 hour  Intake 4288.75 ml  Output      0 ml  Net 4288.75 ml   Filed Weights   03/11/13 0424 03/12/13 0500 03/13/13 0500  Weight: 72.2 kg (159 lb 2.8 oz) 73.7 kg (162 lb 7.7 oz) 77.6 kg (171 lb 1.2 oz)    Exam:  General: Elderly male lying in bed in no acute distress  HEENT: No pallor, moist oral mucosa,  Chest: clear breath sounds bilaterally   Cardiovascular: S1 and S2 irregularly irregular  Abdomen: Soft, nontender, nondistended, bowel sounds present , palpable mass over the right middle quadrant measuring 6x 6 cm with mild tednerness Musculoskeletal: Warm, no edema  CNS: aphasic, responds to simple commands,normal power in all extremities   Data Reviewed: Basic Metabolic Panel:  Recent Labs Lab 03/11/13 0135 03/13/13 0550  NA 139 141  K 3.9 3.4*  CL 102 104  CO2 23 21  GLUCOSE 174* 150*  BUN 16 12  CREATININE 1.10 0.91  CALCIUM 9.5 9.2   Liver Function Tests:  Recent Labs Lab 03/11/13 0135  AST 12  ALT 10  ALKPHOS 96  BILITOT 0.6  PROT 7.5  ALBUMIN 3.5   No results found for this basename: LIPASE, AMYLASE,  in the last 168 hours No results found for this basename: AMMONIA,  in the last 168 hours CBC:  Recent Labs Lab 03/11/13 0135  WBC 7.1  NEUTROABS 5.4  HGB 14.3  HCT 43.9  MCV 82.7  PLT 170   Cardiac Enzymes:  Recent Labs Lab 03/11/13 0135  TROPONINI <0.30   BNP (last 3 results)  Recent Labs  06/05/12 0459 11/01/12 0056 11/04/12 2131  PROBNP 4068.0* 7093.0* 6884.0*   CBG:  Recent Labs Lab 03/12/13 1637 03/12/13 2236 03/13/13 0727 03/13/13 1144 03/13/13 1631  GLUCAP 130* 166* 142* 183* 110*    Recent Results (from the past 240 hour(s))  CULTURE, BLOOD (ROUTINE X 2)     Status: None   Collection Time    03/11/13  2:13 AM      Result Value Ref Range Status   Specimen Description BLOOD RIGHT ANTECUBITAL   Final   Special Requests     Final   Value: BOTTLES DRAWN AEROBIC AND ANAEROBIC AEB=4CC ANA=6CC   Culture NO GROWTH 2 DAYS   Final   Report Status PENDING   Incomplete  CULTURE, BLOOD (ROUTINE X 2)     Status: None   Collection Time    03/11/13  2:13 AM      Result Value Ref Range Status   Specimen Description BLOOD RIGHT HAND   Final   Special Requests BOTTLES DRAWN AEROBIC AND ANAEROBIC 5CC EACH   Final   Culture  Setup Time     Final   Value: 03/12/2013  14:12     Performed at [facility]   Culture     Final   Value: STAPHYLOCOCCUS SPECIES (COAGULASE NEGATIVE)     Note: THE SIGNIFICANCE OF ISOLATING THIS ORGANISM FROM A SINGLE SET OF BLOOD CULTURES WHEN MULTIPLE SETS ARE DRAWN IS UNCERTAIN. PLEASE NOTIFY THE MICROBIOLOGY DEPARTMENT WITHIN ONE WEEK IF SPECIATION AND SENSITIVITIES ARE REQUIRED.     Note: Gram Stain Report Called to,Read Back By and Verified With: MCDANIEL M AT 0040 ON [id number] BY Mikel Cella K Performed at [facility]     Performed at [facility]   Report Status 03/13/2013 FINAL   Final  MRSA PCR SCREENING  Status: None   Collection Time    03/11/13  4:16 AM      Result Value Ref Range Status   MRSA by PCR NEGATIVE  NEGATIVE Final   Comment:            The GeneXpert MRSA Assay (FDA     approved for NASAL specimens     only), is one component of a     comprehensive MRSA colonization     surveillance program. It is not     intended to diagnose MRSA     infection nor to guide or     monitor treatment for     MRSA infections.  CULTURE, BLOOD (ROUTINE X 2)     Status: None   Collection Time    03/12/13  8:30 AM      Result Value Ref Range Status   Specimen Description BLOOD RIGHT HAND   Final   Special Requests BOTTLES DRAWN AEROBIC AND ANAEROBIC 4CC   Final   Culture NO GROWTH 1 DAY   Final   Report Status PENDING   Incomplete  CULTURE, BLOOD (ROUTINE X 2)     Status: None   Collection Time    03/12/13  8:30 AM      Result Value Ref Range Status   Specimen Description BLOOD RIGHT HAND   Final   Special Requests BOTTLES DRAWN AEROBIC AND ANAEROBIC 4CC   Final   Culture NO GROWTH 1 DAY   Final   Report Status PENDING   Incomplete     Studies: Ct Angio Neck W/cm &/or Wo/cm  03/12/2013   CLINICAL DATA:  Abnormal carotid Doppler study with extensive irregular atherosclerotic plaque bilaterally but no definite hemodynamically significant stenosis. Stroke.  EXAM: CT ANGIOGRAPHY NECK  TECHNIQUE:  Multidetector CT imaging of the neck was performed using the standard protocol during bolus administration of intravenous contrast. Multiplanar CT image reconstructions and MIPs were obtained to evaluate the vascular anatomy. Carotid stenosis measurements (when applicable) are obtained utilizing NASCET criteria, using the distal internal carotid diameter as the denominator.  CONTRAST:  39mL OMNIPAQUE IOHEXOL 350 MG/ML SOLN  COMPARISON:  US CAROTID DUPLEX BILAT dated 03/11/2013; CT HEAD W/O CM dated 03/11/2013; CT C SPINE W/O CM dated 06/02/2012; CT CHEST W/CM dated 08/25/2010  FINDINGS: There is a common origin of the left common carotid artery and the innominate artery. Dense calcific plaque is present in the proximal left subclavian artery, narrowing the lumen to 2.9 mm. The more distal left subclavian artery measures 8.8 mm.  The vertebral arteries originate from the subclavian arteries bilaterally. Calcifications are present adjacent to the vertebral artery ostia without significant stenosis. There are scattered calcifications throughout cervical vertebral arteries. The lumen is narrowed to 1.3 mm on the right at C5. This compares with the more normal distal vessel measuring 3.4 mm at the level of C2. Calcifications are present in the left vertebral artery without significant canal compromise.  Calcifications are present along the right common carotid artery. There is a dense calcification of the level of C5 which narrows the lumen the 3.1 mm. This compares with the more distal common carotid measurement of 7.6 mm proximal to the bifurcation.  A high-grade calcific stenosis is present at the carotid bifurcation. The lumen is narrowed to 1.5 mm and this compares with more distal luminal measurements of 6.1 mm.  Atherosclerotic calcifications are noted along the left common carotid artery without a significant stenosis. There are additional calcifications at the left carotid  bifurcation without a significant stenosis  relative to the distal vessel.  Atherosclerotic calcifications are present within the cavernous carotid arteries bilaterally without definite stenosis. Limited imaging of the brain is unremarkable.  The soft tissues of the neck are otherwise unremarkable. A nodular density in the medial left upper lobe demonstrates interval increase in size since 2012. The lesion now measures 25 x 22 x 20 mm. No other focal nodule, mass, or airspace disease is present. There is no significant adenopathy.  Review of the MIP images confirms the above findings.  IMPRESSION: 1. Extensive calcified atherosclerotic changes throughout the neck. 2. 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 3. 60% stenosis of the right vertebral artery at the level of C5. Scattered calcifications are present throughout the vertebral arteries without other significant stenoses. 4. 60% stenosis of the mid right common carotid artery at the level of C5. 5. 75% stenosis of the proximal right internal carotid artery with dense atherosclerotic calcifications. 6. Enlarging 2.5 cm left upper lobe lung nodule. Recommend CT the chest with contrast for further evaluation. PET scanning may be useful as well depending on findings from the remainder of the chest. These results will be called to the ordering clinician or representative by the Radiologist Assistant, and communication documented in the PACS Dashboard.   Electronically Signed   By: Lawrence Santiago M.D.   On: 03/12/2013 10:39   Ct Chest W Contrast  03/13/2013   CLINICAL DATA:  Left upper lobe nodule identified on recent neck CT.  EXAM: CT CHEST WITH CONTRAST  TECHNIQUE: Multidetector CT imaging of the chest was performed during intravenous contrast administration.  CONTRAST:  79mL OMNIPAQUE IOHEXOL 300 MG/ML  SOLN  COMPARISON:  CT of the neck 03/12/2013.  FINDINGS: Mediastinum: Heart size is mildly enlarged. There is no significant pericardial fluid,  thickening or pericardial calcification. There is atherosclerosis of the thoracic aorta, the great vessels of the mediastinum and the coronary arteries, including calcified atherosclerotic plaque in the left main, left anterior descending, left circumflex and right coronary arteries. Status post median sternotomy for CABG, including LIMA to the LAD. In addition, there is aneurysmal dilatation of the descending thoracic aorta which measures up to 5.7 cm in diameter (significantly increased compared to prior study 08/25/2010, at which point it measured only 4.3 cm in diameter). No pathologically enlarged mediastinal or hilar lymph nodes. Esophagus is unremarkable in appearance.  Lungs/Pleura: In the medial aspect of the apex of the left upper lobe there is an enlarging irregular-shaped masslike opacity that measures up to 4.2 x 2.2 cm (images 7 and 8 of series 3). There is some surrounding architectural distortion and this lesion makes contact with the overlying pleura both medially and anteriorly, where there is some mild pleural retraction. Probable scarring throughout the left lung base. Moderate diffuse bronchial wall thickening. Mild centrilobular emphysema. Small left pleural effusion layering dependently.  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 Diffuse adreniform thickening of the left adrenal gland is similar to the prior examination, although there is a new nodular component of this laterally which measures approximately 1.5 x 2.2 cm (image 55 of series 2), which is indeterminate.  Musculoskeletal: Old T12 compression fracture with approximately 40% loss of anterior vertebral body height appear slightly increased compared to remote prior chest radiograph 09/13/2006. In addition, there is a new compression fracture of T8 with approximately 25% loss of anterior vertebral  body height, which does not appear to be acute.  There are no aggressive appearing lytic or blastic lesions noted in the visualized portions of the skeleton. Multiple old healed right-sided rib fractures. Median sternotomy wires.  IMPRESSION: 1. Interval growth of an irregular-shaped mass like opacity in the medial aspect of the left apex, which currently measures up to 4.2 x 2.2 cm and is highly concerning for a slow-growing neoplasm such as an adenocarcinoma. Correlation with PET-CT and/or biopsy is strongly recommended to exclude neoplasm at this time. 2. Fatty adreniform enlargement of the left adrenal gland is similar to the prior study, although there is a new soft tissue nodule extending off the lateral aspect of the gland which measures 2.2 x 1.5 cm. This is indeterminate on today's examination, and the possibility of a metastatic lesion or primary adrenal lesion warrants consideration. This could be definitively characterized with MRI of the abdomen or noncontrast CT of the abdomen if clinically indicated. 3. Atherosclerosis, including left main and 3 vessel coronary artery disease. In addition, there is increasing aneurysmal dilatation of the descending thoracic aorta and visualized portions of the abdominal aorta, as above. In particular, the descending thoracic aorta has increased from 4.3 cm in diameter on the prior examination from 08/25/2010 to 5.7 cm in diameter on today's examination. Patient is status post median sternotomy for CABG. 4. Diffuse bronchial wall thickening with mild centrilobular emphysema ; imaging findings suggestive of underlying COPD. 5. Additional incidental findings, as above. These results will be called to the ordering clinician or representative by the Radiologist Assistant, and communication documented in the PACS Dashboard.   Electronically Signed   By: Vinnie Langton M.D.   On: 03/13/2013 10:11    Scheduled Meds: . aspirin  300 mg Rectal Daily   Or  . aspirin  325 mg Oral Daily  . atorvastatin  80 mg Oral q1800   . diltiazem  30 mg Oral QID  . enoxaparin (LOVENOX) injection  40 mg Subcutaneous Q24H  . feeding supplement (GLUCERNA SHAKE)  237 mL Oral BID BM  . insulin aspart  0-9 Units Subcutaneous TID WC  . metoprolol succinate  50 mg Oral Daily  . vancomycin  1,000 mg Intravenous Q12H   Continuous Infusions: . diltiazem (CARDIZEM) infusion Stopped (03/13/13 1016)      Time spent: 35 minutes    Asante Ritacco, [place]  [facility] Pager [phone]. If 7PM-7AM, please contact night-coverage at [email or web address], password [facility] 03/13/2013, 5:02 PM  LOS: 2 days

## 2013-03-13 NOTE — Progress Notes (Signed)
Patient HR up to the 160's, tried to get patient to bare down which he would not do. Texted paged mid level.

## 2013-03-13 NOTE — Progress Notes (Signed)
Patient had several episodes of incontinence throughout the shift

## 2013-03-13 NOTE — Progress Notes (Signed)
ANTIBIOTIC CONSULT NOTE- Follow up  Pharmacy Consult for Vancomycin Indication: Bacteremia  Allergies  Allergen Reactions  . Benadryl [Diphenhydramine Hcl] Other (See Comments)    Hot Flashes "Like Niaspan"   Patient Measurements: Height: 5\' 10"  (177.8 cm) Weight: 171 lb 1.2 oz (77.6 kg) IBW/kg (Calculated) : 73  Vital Signs: Temp: 98.3 F (36.8 C) (03/13 0730) Temp src: Oral (03/13 0730) BP: 140/74 mmHg (03/13 1100) Pulse Rate: 84 (03/13 1100)  Labs:  Recent Labs  03/11/13 0135 03/13/13 0550  WBC 7.1  --   HGB 14.3  --   PLT 170  --   CREATININE 1.10 0.91   Estimated Creatinine Clearance: 79.1 ml/min (by C-G formula based on Cr of 0.91).  No results found for this basename: VANCOTROUGH, Corlis Leak, VANCORANDOM, GENTTROUGH, GENTPEAK, GENTRANDOM, TOBRATROUGH, TOBRAPEAK, TOBRARND, AMIKACINPEAK, AMIKACINTROU, AMIKACIN,  in the last 72 hours   Microbiology: Recent Results (from the past 720 hour(s))  MRSA PCR SCREENING     Status: None   Collection Time    03/01/13  6:00 PM      Result Value Ref Range Status   MRSA by PCR NEGATIVE  NEGATIVE Final   Comment:            The GeneXpert MRSA Assay (FDA     approved for NASAL specimens     only), is one component of a     comprehensive MRSA colonization     surveillance program. It is not     intended to diagnose MRSA     infection nor to guide or     monitor treatment for     MRSA infections.  CULTURE, BLOOD (ROUTINE X 2)     Status: None   Collection Time    03/11/13  2:13 AM      Result Value Ref Range Status   Specimen Description BLOOD RIGHT ANTECUBITAL   Final   Special Requests     Final   Value: BOTTLES DRAWN AEROBIC AND ANAEROBIC AEB=4CC ANA=6CC   Culture NO GROWTH 2 DAYS   Final   Report Status PENDING   Incomplete  CULTURE, BLOOD (ROUTINE X 2)     Status: None   Collection Time    03/11/13  2:13 AM      Result Value Ref Range Status   Specimen Description BLOOD RIGHT HAND   Final   Special Requests  BOTTLES DRAWN AEROBIC AND ANAEROBIC 5CC EACH   Final   Culture  Setup Time     Final   Value: 03/12/2013 14:12     Performed at [facility]   Culture     Final   Value: STAPHYLOCOCCUS SPECIES (COAGULASE NEGATIVE)     Note: THE SIGNIFICANCE OF ISOLATING THIS ORGANISM FROM A SINGLE SET OF BLOOD CULTURES WHEN MULTIPLE SETS ARE DRAWN IS UNCERTAIN. PLEASE NOTIFY THE MICROBIOLOGY DEPARTMENT WITHIN ONE WEEK IF SPECIATION AND SENSITIVITIES ARE REQUIRED.     Note: Gram Stain Report Called to,Read Back By and Verified With: MCDANIEL M AT 0040 ON [id number] BY Mikel Cella K Performed at [facility]     Performed at [facility]   Report Status 03/13/2013 FINAL   Final  MRSA PCR SCREENING     Status: None   Collection Time    03/11/13  4:16 AM      Result Value Ref Range Status   MRSA by PCR NEGATIVE  NEGATIVE Final   Comment:  The GeneXpert MRSA Assay (FDA     approved for NASAL specimens     only), is one component of a     comprehensive MRSA colonization     surveillance program. It is not     intended to diagnose MRSA     infection nor to guide or     monitor treatment for     MRSA infections.  CULTURE, BLOOD (ROUTINE X 2)     Status: None   Collection Time    03/12/13  8:30 AM      Result Value Ref Range Status   Specimen Description BLOOD RIGHT HAND   Final   Special Requests BOTTLES DRAWN AEROBIC AND ANAEROBIC 4CC   Final   Culture NO GROWTH 1 DAY   Final   Report Status PENDING   Incomplete  CULTURE, BLOOD (ROUTINE X 2)     Status: None   Collection Time    03/12/13  8:30 AM      Result Value Ref Range Status   Specimen Description BLOOD RIGHT HAND   Final   Special Requests BOTTLES DRAWN AEROBIC AND ANAEROBIC 4CC   Final   Culture NO GROWTH 1 DAY   Final   Report Status PENDING   Incomplete   Medical History: Past Medical History  Diagnosis Date  . Coronary atherosclerosis of native coronary artery     a. CABG in 2004 - LIMA to LAD, SVG to diag,  SVG to OM, SVG to PDA. EF was 40%. b. Cath 2006 s/p DES to prox RCA.  . Type 2 diabetes mellitus   . Hyperlipidemia   . Essential hypertension, benign   . Atypical atrial flutter     a. Dx 06/2012. b. Not a candidate for anticoag due to fall, tenuous social situation.  . Ischemic cardiomyopathy     a. EF 40% in 2004. b. EF 55% in 06/2012.  Marland Kitchen Aortic stenosis     a. By echo 06/2012, very calcified but mean gradient 1mmHg - will need clinical f/u and serial echoes.  . Low TSH level     a. 06/2012, f/u pcp.  Marland Kitchen Poor social situation   . Cerebral hemorrhage     a. At time of CABG - had post-op encephalopathy and small frontal hemorrhage in 2004.  Marland Kitchen Noncompliance    Assessment: 68 yo male admitted 03/11/13 with stroke and PMH sig for DM, hypertension, CVA, and atrial flutter. Blood cultures drawn on admission now positive for gram positive cocci in clusters.  Estimated Creatinine Clearance: 79.1 ml/min (by C-G formula based on Cr of 0.91).  Vancomycin 3/12 >>  Goal of Therapy:  Vancomycin troughs 15-20 mcg/ml  Plan:   Vancomycin 1gm IV q12hrs  Check trough tomorrow am  F/U recommendations from infectious disease service  Monitor labs, renal fxn, and cultures  Hart Robinsons A, RPH 03/13/2013,11:40 AM

## 2013-03-13 NOTE — Progress Notes (Signed)
PT TOOK PO MEDS WELL. MINIMAL DRIBBLING WHEN TAKING W/ SIPS OF GLUCERNA.

## 2013-03-13 NOTE — Progress Notes (Signed)
UR chart review completed.  

## 2013-03-13 NOTE — Progress Notes (Signed)
PT IS ALERT AND COOPERATIVE.

## 2013-03-13 NOTE — Progress Notes (Signed)
I agree with student's assessment.  Jacqueline Spofford A. Jimmye Norman, RD, LDN Pager: [phone]

## 2013-03-13 NOTE — Progress Notes (Signed)
BLADDER SCAN PREFORMED. PT INCONTINENT OF URINE. 51CC URINE PICKED UP ON SCANNER. PT HAS LARGE LUNP AT UMBILICUS THAT IS GREATER THAN A LARGE GRAPEFRUIT. PT EXPRESSES THAT HE HAS HAD THIS FOR A WHILE.

## 2013-03-14 LAB — URINALYSIS, ROUTINE W REFLEX MICROSCOPIC
Bilirubin Urine: NEGATIVE
Glucose, UA: 100 mg/dL — AB
Ketones, ur: NEGATIVE mg/dL
LEUKOCYTES UA: NEGATIVE
NITRITE: NEGATIVE
PROTEIN: NEGATIVE mg/dL
UROBILINOGEN UA: 0.2 mg/dL (ref 0.0–1.0)
pH: 6 (ref 5.0–8.0)

## 2013-03-14 LAB — GLUCOSE, CAPILLARY
GLUCOSE-CAPILLARY: 150 mg/dL — AB (ref 70–99)
Glucose-Capillary: 173 mg/dL — ABNORMAL HIGH (ref 70–99)
Glucose-Capillary: 116 mg/dL — ABNORMAL HIGH (ref 70–99)
Glucose-Capillary: 149 mg/dL — ABNORMAL HIGH (ref 70–99)

## 2013-03-14 LAB — RAPID URINE DRUG SCREEN, HOSP PERFORMED
AMPHETAMINES: NOT DETECTED
BARBITURATES: NOT DETECTED
Benzodiazepines: NOT DETECTED
Cocaine: NOT DETECTED
Opiates: NOT DETECTED
TETRAHYDROCANNABINOL: NOT DETECTED

## 2013-03-14 LAB — URINE MICROSCOPIC-ADD ON

## 2013-03-14 MED ORDER — METOPROLOL TARTRATE 1 MG/ML IV SOLN
5.0000 mg | Freq: Four times a day (QID) | INTRAVENOUS | Status: DC | PRN
  Administered 2013-03-15: 5 mg via INTRAVENOUS
  Filled 2013-03-14 (×2): qty 5

## 2013-03-14 MED ORDER — DILTIAZEM HCL 60 MG PO TABS
120.0000 mg | ORAL_TABLET | Freq: Every day | ORAL | Status: DC
  Administered 2013-03-15 – 2013-03-16 (×2): 120 mg via ORAL
  Filled 2013-03-14 (×2): qty 2

## 2013-03-14 MED ORDER — NICOTINE 21 MG/24HR TD PT24
21.0000 mg | MEDICATED_PATCH | Freq: Every day | TRANSDERMAL | Status: DC
  Administered 2013-03-14 – 2013-03-20 (×7): 21 mg via TRANSDERMAL
  Filled 2013-03-14 (×7): qty 1

## 2013-03-14 NOTE — Progress Notes (Addendum)
[facility] PROGRESS NOTE  [name] [id number] DOB: Jun 04, 1945 DOA: 03/11/2013 PCP: Carolee Rota, NP   Brief narrative 68 year old male with past medical history of coronary atherosclerosis of native coronary artery, Type 2 diabetes mellitus, Hyperlipidemia; Essential hypertension, benign, Atypical atrial flutter, Ischemic cardiomyopathy, Aortic stenosis, , hx of cerebral hemorrhage, and  and Noncompliance presented to the ED with chief complaint of weakness of right side and inability to speak since 1 day with findings of bilateral stroke on MRI.  Assessment/Plan:  Acute stroke  Patient has bilateral stroke on head CT and is aphasic. Continue to monitor in ICU. MRI of the brain , MRA head still pending. Will reorder again( to evaluate extent of stroke and r/o any mets). Need to check for any brain mets. Patient recently had a 2-D echo showed low normal EF with hypokinesis of basal inferior lateral myocardium and grade 2 diastolic dysfunction.. Given multiple areas of infarct may need TEE but since he is not a candidate for any anticoagulation or intervention will hold off on TEE.  -Continue with full dose aspirin.  -Carotid Doppler showing extensive amount of bilateral atherosclerotic plaque.  CT angio of the neck again shows extensive carotid stenosis bilaterally.  - neurology consult agree with current plan. -Continue Lipitor  -swallow, speech , PT/ OT eval.  -cardiac diet  A. fib with RVR  Patient had persistent rapid A. fib despite high dose of Cardizem and when necessary IV Lopressor. Appreciate cardiology consult and given loading dose of IV digoxin.his HR better controlled now and is off  Cardizem drip.contionue oral cardizem and oral metoprolol..    Diabetes mellitus  . Continue on sliding scale insulin  A1C of 6.6   Hypertension  Allow permissive hypertension  Left  upper lobe long nodule CT angio neck shows an enlarging LUL lung nodule. ( last seen in  2012 with size of 2.4 x 1 .5 cm), given underlying COPD high likely hood for lung ca.  CT chest shows increase in size of LUL nodule concerning for malignancy. Patient is a heavy smoker and smokes 2PPD at present. Also shows a right adrenal mass concerning for mets. Will need PET  as outpt before deciding for tissue diagnosis. Will get MRI brain to r/o mets.  informed PCP about the findings on 3/13 who will arrange for outpt follow up and PET scan after discharge.     Descending thoracic aortic aneurysm CT showed increase in size of descending thoracic aneurysm from 4.3 cm in 2012 to 5.7 cm.  discussed findings  with vascular surgery Dr. Trula Slade also discussed about 75%  stenosis of right carotid artery. He will arrange for outpt follow up in his office uopn discharge.   Protein calorie malnutrition Continue supplements.  Tobacco abuse Counseled on smoking cessation. Will order nicotine patch  Code Status: full  Family Communication: discussed with family friend melinda at bedside on 3/13. Patient has  an estranged  sister with whom he is no longer in contact. He also has 2 sons both of whom are not been contact with him for many years. patient lives with a family friend and his wife.  Disposition Plan: home with [place] possibly early next week  Consultants:  Neurology cardiology  Procedures:  None   Antibiotics:  None HPI/Subjective:  Patient seen and examined this morning. He is still  aphesic and responds to commands only.  Objective: Filed Vitals:   03/14/13 1330  BP: 115/76  Pulse:   Temp:   Resp: 24  Intake/Output Summary (Last 24 hours) at 03/14/13 1343 Last data filed at 03/14/13 1330  Gross per 24 hour  Intake 2186.99 ml  Output    150 ml  Net 2036.99 ml   Filed Weights   03/12/13 0500 03/13/13 0500 03/14/13 0500  Weight: 73.7 kg (162 lb 7.7 oz) 77.6 kg (171 lb 1.2 oz) 76.567 kg (168 lb 12.8 oz)    Exam:  General: Elderly male lying in bed in no acute  distress  HEENT: No pallor, moist oral mucosa,  Chest: clear breath sounds bilaterally  Cardiovascular: S1 and S2 irregularly irregular  Abdomen: Soft, nontender, nondistended, bowel sounds present , palpable mass over the right middle quadrant measuring 6x 6 cm with mild tednerness Musculoskeletal: Warm, no edema  CNS: aphasic, responds to simple commands,normal power in all extremities   Data Reviewed: Basic Metabolic Panel:  Recent Labs Lab 03/11/13 0135 03/13/13 0550  NA 139 141  K 3.9 3.4*  CL 102 104  CO2 23 21  GLUCOSE 174* 150*  BUN 16 12  CREATININE 1.10 0.91  CALCIUM 9.5 9.2   Liver Function Tests:  Recent Labs Lab 03/11/13 0135  AST 12  ALT 10  ALKPHOS 96  BILITOT 0.6  PROT 7.5  ALBUMIN 3.5   No results found for this basename: LIPASE, AMYLASE,  in the last 168 hours No results found for this basename: AMMONIA,  in the last 168 hours CBC:  Recent Labs Lab 03/11/13 0135  WBC 7.1  NEUTROABS 5.4  HGB 14.3  HCT 43.9  MCV 82.7  PLT 170   Cardiac Enzymes:  Recent Labs Lab 03/11/13 0135  TROPONINI <0.30   BNP (last 3 results)  Recent Labs  06/05/12 0459 11/01/12 0056 11/04/12 2131  PROBNP 4068.0* 7093.0* 6884.0*   CBG:  Recent Labs Lab 03/13/13 1144 03/13/13 1631 03/13/13 2214 03/14/13 0747 03/14/13 1156  GLUCAP 183* 110* 218* 173* 150*    Recent Results (from the past 240 hour(s))  CULTURE, BLOOD (ROUTINE X 2)     Status: None   Collection Time    03/11/13  2:13 AM      Result Value Ref Range Status   Specimen Description BLOOD RIGHT ANTECUBITAL   Final   Special Requests     Final   Value: BOTTLES DRAWN AEROBIC AND ANAEROBIC AEB=4CC ANA=6CC   Culture NO GROWTH 3 DAYS   Final   Report Status PENDING   Incomplete  CULTURE, BLOOD (ROUTINE X 2)     Status: None   Collection Time    03/11/13  2:13 AM      Result Value Ref Range Status   Specimen Description BLOOD RIGHT HAND   Final   Special Requests BOTTLES DRAWN AEROBIC  AND ANAEROBIC 5CC EACH   Final   Culture  Setup Time     Final   Value: 03/12/2013 14:12     Performed at [facility]   Culture     Final   Value: STAPHYLOCOCCUS SPECIES (COAGULASE NEGATIVE)     Note: THE SIGNIFICANCE OF ISOLATING THIS ORGANISM FROM A SINGLE SET OF BLOOD CULTURES WHEN MULTIPLE SETS ARE DRAWN IS UNCERTAIN. PLEASE NOTIFY THE MICROBIOLOGY DEPARTMENT WITHIN ONE WEEK IF SPECIATION AND SENSITIVITIES ARE REQUIRED.     Note: Gram Stain Report Called to,Read Back By and Verified With: MCDANIEL M AT 0040 ON [id number] BY FORSYTH K Performed at [facility]     Performed at [facility]   Report Status 03/13/2013 FINAL  Final  MRSA PCR SCREENING     Status: None   Collection Time    03/11/13  4:16 AM      Result Value Ref Range Status   MRSA by PCR NEGATIVE  NEGATIVE Final   Comment:            The GeneXpert MRSA Assay (FDA     approved for NASAL specimens     only), is one component of a     comprehensive MRSA colonization     surveillance program. It is not     intended to diagnose MRSA     infection nor to guide or     monitor treatment for     MRSA infections.  CULTURE, BLOOD (ROUTINE X 2)     Status: None   Collection Time    03/12/13  8:30 AM      Result Value Ref Range Status   Specimen Description BLOOD RIGHT HAND   Final   Special Requests BOTTLES DRAWN AEROBIC AND ANAEROBIC 4CC   Final   Culture NO GROWTH 2 DAYS   Final   Report Status PENDING   Incomplete  CULTURE, BLOOD (ROUTINE X 2)     Status: None   Collection Time    03/12/13  8:30 AM      Result Value Ref Range Status   Specimen Description BLOOD RIGHT HAND   Final   Special Requests BOTTLES DRAWN AEROBIC AND ANAEROBIC 4CC   Final   Culture NO GROWTH 2 DAYS   Final   Report Status PENDING   Incomplete     Studies: Ct Chest W Contrast  03/13/2013   CLINICAL DATA:  Left upper lobe nodule identified on recent neck CT.  EXAM: CT CHEST WITH CONTRAST  TECHNIQUE: Multidetector CT  imaging of the chest was performed during intravenous contrast administration.  CONTRAST:  67mL OMNIPAQUE IOHEXOL 300 MG/ML  SOLN  COMPARISON:  CT of the neck 03/12/2013.  FINDINGS: Mediastinum: Heart size is mildly enlarged. There is no significant pericardial fluid, thickening or pericardial calcification. There is atherosclerosis of the thoracic aorta, the great vessels of the mediastinum and the coronary arteries, including calcified atherosclerotic plaque in the left main, left anterior descending, left circumflex and right coronary arteries. Status post median sternotomy for CABG, including LIMA to the LAD. In addition, there is aneurysmal dilatation of the descending thoracic aorta which measures up to 5.7 cm in diameter (significantly increased compared to prior study 08/25/2010, at which point it measured only 4.3 cm in diameter). No pathologically enlarged mediastinal or hilar lymph nodes. Esophagus is unremarkable in appearance.  Lungs/Pleura: In the medial aspect of the apex of the left upper lobe there is an enlarging irregular-shaped masslike opacity that measures up to 4.2 x 2.2 cm (images 7 and 8 of series 3). There is some surrounding architectural distortion and this lesion makes contact with the overlying pleura both medially and anteriorly, where there is some mild pleural retraction. Probable scarring throughout the left lung base. Moderate diffuse bronchial wall thickening. Mild centrilobular emphysema. Small left pleural effusion layering dependently.  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 Diffuse adreniform thickening of the left adrenal gland is similar to the prior examination, although there is a new nodular component of this laterally which measures approximately 1.5 x 2.2 cm (image 55 of series 2), which is indeterminate.  Musculoskeletal: Old T12 compression fracture with approximately 40% loss  of anterior vertebral body height appear slightly increased compared to remote prior chest radiograph 09/13/2006. In addition, there is a new compression fracture of T8 with approximately 25% loss of anterior vertebral body height, which does not appear to be acute. There are no aggressive appearing lytic or blastic lesions noted in the visualized portions of the skeleton. Multiple old healed right-sided rib fractures. Median sternotomy wires.  IMPRESSION: 1. Interval growth of an irregular-shaped mass like opacity in the medial aspect of the left apex, which currently measures up to 4.2 x 2.2 cm and is highly concerning for a slow-growing neoplasm such as an adenocarcinoma. Correlation with PET-CT and/or biopsy is strongly recommended to exclude neoplasm at this time. 2. Fatty adreniform enlargement of the left adrenal gland is similar to the prior study, although there is a new soft tissue nodule extending off the lateral aspect of the gland which measures 2.2 x 1.5 cm. This is indeterminate on today's examination, and the possibility of a metastatic lesion or primary adrenal lesion warrants consideration. This could be definitively characterized with MRI of the abdomen or noncontrast CT of the abdomen if clinically indicated. 3. Atherosclerosis, including left main and 3 vessel coronary artery disease. In addition, there is increasing aneurysmal dilatation of the descending thoracic aorta and visualized portions of the abdominal aorta, as above. In particular, the descending thoracic aorta has increased from 4.3 cm in diameter on the prior examination from 08/25/2010 to 5.7 cm in diameter on today's examination. Patient is status post median sternotomy for CABG. 4. Diffuse bronchial wall thickening with mild centrilobular emphysema ; imaging findings suggestive of underlying COPD. 5. Additional incidental findings, as above. These results will be called to the ordering clinician or representative by the  Radiologist Assistant, and communication documented in the PACS Dashboard.   Electronically Signed   By: Vinnie Langton M.D.   On: 03/13/2013 10:11    Scheduled Meds: . aspirin  300 mg Rectal Daily   Or  . aspirin  325 mg Oral Daily  . atorvastatin  80 mg Oral q1800  . diltiazem  30 mg Oral QID  . enoxaparin (LOVENOX) injection  40 mg Subcutaneous Q24H  . feeding supplement (GLUCERNA SHAKE)  237 mL Oral BID BM  . insulin aspart  0-9 Units Subcutaneous TID WC  . metoprolol  5 mg Intravenous 4 times per day  . metoprolol succinate  50 mg Oral Daily   Continuous Infusions: . diltiazem (CARDIZEM) infusion Stopped (03/13/13 1016)  . diltiazem (CARDIZEM) infusion Stopped (03/14/13 1330)      Time spent: 35 minutes    Walburga Hudman, [place]  [facility] Pager [phone]. If 7PM-7AM, please contact night-coverage at [email or web address], password [facility] 03/14/2013, 1:43 PM  LOS: 3 days

## 2013-03-14 NOTE — Progress Notes (Signed)
PT'S SPEECH IS BECOMING MORE PRONOUNCED AND CORRECT.

## 2013-03-15 DIAGNOSIS — E119 Type 2 diabetes mellitus without complications: Secondary | ICD-10-CM

## 2013-03-15 LAB — URINE CULTURE

## 2013-03-15 LAB — GLUCOSE, CAPILLARY
GLUCOSE-CAPILLARY: 124 mg/dL — AB (ref 70–99)
Glucose-Capillary: 181 mg/dL — ABNORMAL HIGH (ref 70–99)
Glucose-Capillary: 180 mg/dL — ABNORMAL HIGH (ref 70–99)
Glucose-Capillary: 106 mg/dL — ABNORMAL HIGH (ref 70–99)

## 2013-03-15 MED ORDER — METOPROLOL SUCCINATE ER 50 MG PO TB24
100.0000 mg | ORAL_TABLET | Freq: Every day | ORAL | Status: DC
  Administered 2013-03-15: 100 mg via ORAL
  Filled 2013-03-15: qty 2

## 2013-03-15 NOTE — Progress Notes (Signed)
[facility] PROGRESS NOTE  [name] PTW:[id number] DOB: [birth year] DOA: 03/11/2013 PCP: Carolee Rota, NP  Assessment/Plan: 68 year old male with past medical history of CAD, HTN, DM, CHF, PAF not on AC (bleeding, fall), h/o CVA, cerebral hemorrhage, Noncompliance presented to the ED with chief complaint of weakness of right side and inability to speak since 1 day with findings of bilateral stroke on MRI.   1. Acute CVA; Moderate to large left hemispheric infarct presenting with severe aphasia and mild right hemiparesis; patient evaluated by neurology  -Continue with full dose aspirin, Continue Lipitor; not a candidate for anticoagulation at this time  2. A. fib with RVR  -increased metoprolol 100, cont Cardizem  3. Diabetes mellitus; HA1C- 6.6; d/c metformin due to AKI/CKD -Continue on sliding scale insulin  4. Hypertension  Allow permissive hypertension  5. Left upper lobe long nodule likely lung CA -CT chest shows increase in size of LUL nodule concerning for malignancy. Patient is a heavy smoker and smokes 2PPD at present. Also shows a right adrenal mass concerning for mets.  -Will need PET as outpt before deciding for tissue diagnosis. Dr. Clementeen Graham informed PCP about the findings on 3/13 who will arrange for outpt follow up and PET scan after discharge.  6. Descending thoracic aortic aneurysm  -CT showed increase in size of descending thoracic aneurysm from 4.3 cm in 2012 to 5.7 cm.  discussed findings with vascular surgery Dr. Trula Slade also discussed about 75% stenosis of right carotid artery. He will arrange for outpt follow up in his office uopn discharge.  7. Protein calorie malnutrition Continue supplements.   Overall prognosis remains poor with probable underlying Lung CA, recurrent CVA -may  Benefit from hospice care in near future   Code Status: full Family Communication: d/w patient, Patient has an estranged sister with whom he is no longer in contact. He also  has 2 sons both of whom are not been contact with him for many years. patient lives with a family friend and his wife.  (indicate person spoken with, relationship, and if by phone, the number) Disposition Plan: hoem PT   Consultants:  Neurology  Cardiology   Procedures:  None   Antibiotics:  None  (indicate start date, and stop date if known)  HPI/Subjective: Alert, aphasia   Objective: Filed Vitals:   03/15/13 0715  BP: 154/99  Pulse: 62  Temp:   Resp: 36    Intake/Output Summary (Last 24 hours) at 03/15/13 0803 Last data filed at 03/15/13 0600  Gross per 24 hour  Intake   1755 ml  Output    700 ml  Net   1055 ml   Filed Weights   03/13/13 0500 03/14/13 0500 03/15/13 0500  Weight: 77.6 kg (171 lb 1.2 oz) 76.567 kg (168 lb 12.8 oz) 77 kg (169 lb 12.1 oz)    Exam:   General:  Aphasia   Cardiovascular: s1,s2 rrr  Respiratory: CTA BL  Abdomen: soft, nt,nd   Musculoskeletal: no LE edema   Data Reviewed: Basic Metabolic Panel:  Recent Labs Lab 03/11/13 0135 03/13/13 0550  NA 139 141  K 3.9 3.4*  CL 102 104  CO2 23 21  GLUCOSE 174* 150*  BUN 16 12  CREATININE 1.10 0.91  CALCIUM 9.5 9.2   Liver Function Tests:  Recent Labs Lab 03/11/13 0135  AST 12  ALT 10  ALKPHOS 96  BILITOT 0.6  PROT 7.5  ALBUMIN 3.5   No results found for this basename: LIPASE, AMYLASE,  in  the last 168 hours No results found for this basename: AMMONIA,  in the last 168 hours CBC:  Recent Labs Lab 03/11/13 0135  WBC 7.1  NEUTROABS 5.4  HGB 14.3  HCT 43.9  MCV 82.7  PLT 170   Cardiac Enzymes:  Recent Labs Lab 03/11/13 0135  TROPONINI <0.30   BNP (last 3 results)  Recent Labs  06/05/12 0459 11/01/12 0056 11/04/12 2131  PROBNP 4068.0* 7093.0* 6884.0*   CBG:  Recent Labs Lab 03/13/13 2214 03/14/13 0747 03/14/13 1156 03/14/13 1621 03/14/13 2059  GLUCAP 218* 173* 150* 116* 149*    Recent Results (from the past 240 hour(s))  CULTURE,  BLOOD (ROUTINE X 2)     Status: None   Collection Time    03/11/13  2:13 AM      Result Value Ref Range Status   Specimen Description BLOOD RIGHT ANTECUBITAL   Final   Special Requests     Final   Value: BOTTLES DRAWN AEROBIC AND ANAEROBIC AEB=4CC ANA=6CC   Culture NO GROWTH 4 DAYS   Final   Report Status PENDING   Incomplete  CULTURE, BLOOD (ROUTINE X 2)     Status: None   Collection Time    03/11/13  2:13 AM      Result Value Ref Range Status   Specimen Description BLOOD RIGHT HAND   Final   Special Requests BOTTLES DRAWN AEROBIC AND ANAEROBIC 5CC EACH   Final   Culture  Setup Time     Final   Value: 03/12/2013 14:12     Performed at [facility]   Culture     Final   Value: STAPHYLOCOCCUS SPECIES (COAGULASE NEGATIVE)     Note: THE SIGNIFICANCE OF ISOLATING THIS ORGANISM FROM A SINGLE SET OF BLOOD CULTURES WHEN MULTIPLE SETS ARE DRAWN IS UNCERTAIN. PLEASE NOTIFY THE MICROBIOLOGY DEPARTMENT WITHIN ONE WEEK IF SPECIATION AND SENSITIVITIES ARE REQUIRED.     Note: Gram Stain Report Called to,Read Back By and Verified With: MCDANIEL M AT 0040 ON [id number] BY FORSYTH K Performed at [facility]     Performed at [facility]   Report Status 03/13/2013 FINAL   Final  MRSA PCR SCREENING     Status: None   Collection Time    03/11/13  4:16 AM      Result Value Ref Range Status   MRSA by PCR NEGATIVE  NEGATIVE Final   Comment:            The GeneXpert MRSA Assay (FDA     approved for NASAL specimens     only), is one component of a     comprehensive MRSA colonization     surveillance program. It is not     intended to diagnose MRSA     infection nor to guide or     monitor treatment for     MRSA infections.  CULTURE, BLOOD (ROUTINE X 2)     Status: None   Collection Time    03/12/13  8:30 AM      Result Value Ref Range Status   Specimen Description BLOOD RIGHT HAND   Final   Special Requests BOTTLES DRAWN AEROBIC AND ANAEROBIC 4CC   Final   Culture NO GROWTH 3  DAYS   Final   Report Status PENDING   Incomplete  CULTURE, BLOOD (ROUTINE X 2)     Status: None   Collection Time    03/12/13  8:30 AM  Result Value Ref Range Status   Specimen Description BLOOD RIGHT HAND   Final   Special Requests BOTTLES DRAWN AEROBIC AND ANAEROBIC 4CC   Final   Culture NO GROWTH 3 DAYS   Final   Report Status PENDING   Incomplete     Studies: Ct Chest W Contrast  03/13/2013   CLINICAL DATA:  Left upper lobe nodule identified on recent neck CT.  EXAM: CT CHEST WITH CONTRAST  TECHNIQUE: Multidetector CT imaging of the chest was performed during intravenous contrast administration.  CONTRAST:  91mL OMNIPAQUE IOHEXOL 300 MG/ML  SOLN  COMPARISON:  CT of the neck 03/12/2013.  FINDINGS: Mediastinum: Heart size is mildly enlarged. There is no significant pericardial fluid, thickening or pericardial calcification. There is atherosclerosis of the thoracic aorta, the great vessels of the mediastinum and the coronary arteries, including calcified atherosclerotic plaque in the left main, left anterior descending, left circumflex and right coronary arteries. Status post median sternotomy for CABG, including LIMA to the LAD. In addition, there is aneurysmal dilatation of the descending thoracic aorta which measures up to 5.7 cm in diameter (significantly increased compared to prior study 08/25/2010, at which point it measured only 4.3 cm in diameter). No pathologically enlarged mediastinal or hilar lymph nodes. Esophagus is unremarkable in appearance.  Lungs/Pleura: In the medial aspect of the apex of the left upper lobe there is an enlarging irregular-shaped masslike opacity that measures up to 4.2 x 2.2 cm (images 7 and 8 of series 3). There is some surrounding architectural distortion and this lesion makes contact with the overlying pleura both medially and anteriorly, where there is some mild pleural retraction. Probable scarring throughout the left lung base. Moderate diffuse bronchial  wall thickening. Mild centrilobular emphysema. Small left pleural effusion layering dependently.  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 Diffuse adreniform thickening of the left adrenal gland is similar to the prior examination, although there is a new nodular component of this laterally which measures approximately 1.5 x 2.2 cm (image 55 of series 2), which is indeterminate.  Musculoskeletal: Old T12 compression fracture with approximately 40% loss of anterior vertebral body height appear slightly increased compared to remote prior chest radiograph 09/13/2006. In addition, there is a new compression fracture of T8 with approximately 25% loss of anterior vertebral body height, which does not appear to be acute. There are no aggressive appearing lytic or blastic lesions noted in the visualized portions of the skeleton. Multiple old healed right-sided rib fractures. Median sternotomy wires.  IMPRESSION: 1. Interval growth of an irregular-shaped mass like opacity in the medial aspect of the left apex, which currently measures up to 4.2 x 2.2 cm and is highly concerning for a slow-growing neoplasm such as an adenocarcinoma. Correlation with PET-CT and/or biopsy is strongly recommended to exclude neoplasm at this time. 2. Fatty adreniform enlargement of the left adrenal gland is similar to the prior study, although there is a new soft tissue nodule extending off the lateral aspect of the gland which measures 2.2 x 1.5 cm. This is indeterminate on today's examination, and the possibility of a metastatic lesion or primary adrenal lesion warrants consideration. This could be definitively characterized with MRI of the abdomen or noncontrast CT of the abdomen if clinically indicated. 3. Atherosclerosis, including left main and 3 vessel coronary artery disease. In addition, there is increasing aneurysmal dilatation of the descending  thoracic aorta and visualized portions of the abdominal aorta,  as above. In particular, the descending thoracic aorta has increased from 4.3 cm in diameter on the prior examination from 08/25/2010 to 5.7 cm in diameter on today's examination. Patient is status post median sternotomy for CABG. 4. Diffuse bronchial wall thickening with mild centrilobular emphysema ; imaging findings suggestive of underlying COPD. 5. Additional incidental findings, as above. These results will be called to the ordering clinician or representative by the Radiologist Assistant, and communication documented in the PACS Dashboard.   Electronically Signed   By: Vinnie Langton M.D.   On: 03/13/2013 10:11    Scheduled Meds: . aspirin  300 mg Rectal Daily   Or  . aspirin  325 mg Oral Daily  . atorvastatin  80 mg Oral q1800  . diltiazem  120 mg Oral Daily  . enoxaparin (LOVENOX) injection  40 mg Subcutaneous Q24H  . feeding supplement (GLUCERNA SHAKE)  237 mL Oral BID BM  . insulin aspart  0-9 Units Subcutaneous TID WC  . metoprolol succinate  50 mg Oral Daily  . nicotine  21 mg Transdermal Daily   Continuous Infusions:   Principal Problem:   Stroke Active Problems:   Hypertension   Atypical atrial flutter   Diabetes mellitus   CVA (cerebral infarction)   Solitary pulmonary nodule   Thoracic aneurysm without mention of rupture   Adrenal mass, right   Lung mass    Time spent: >35 minutes     Kinnie Feil  [facility] Pager [phone]. If 7PM-7AM, please contact night-coverage at [email or web address], password [facility] 03/15/2013, 8:03 AM  LOS: 4 days

## 2013-03-16 ENCOUNTER — Telehealth: Payer: Self-pay | Admitting: Surgery

## 2013-03-16 ENCOUNTER — Inpatient Hospital Stay (HOSPITAL_COMMUNITY): Payer: PRIVATE HEALTH INSURANCE

## 2013-03-16 ENCOUNTER — Encounter (HOSPITAL_COMMUNITY): Payer: Self-pay

## 2013-03-16 DIAGNOSIS — J9600 Acute respiratory failure, unspecified whether with hypoxia or hypercapnia: Secondary | ICD-10-CM

## 2013-03-16 DIAGNOSIS — J441 Chronic obstructive pulmonary disease with (acute) exacerbation: Secondary | ICD-10-CM

## 2013-03-16 DIAGNOSIS — I359 Nonrheumatic aortic valve disorder, unspecified: Secondary | ICD-10-CM

## 2013-03-16 LAB — BASIC METABOLIC PANEL
BUN: 37 mg/dL — ABNORMAL HIGH (ref 6–23)
CO2: 22 mEq/L (ref 19–32)
Calcium: 9 mg/dL (ref 8.4–10.5)
Chloride: 108 mEq/L (ref 96–112)
Creatinine, Ser: 1.59 mg/dL — ABNORMAL HIGH (ref 0.50–1.35)
GFR calc Af Amer: 49 mL/min — ABNORMAL LOW (ref >90)
GFR calc non Af Amer: 43 mL/min — ABNORMAL LOW (ref >90)
Glucose, Bld: 142 mg/dL — ABNORMAL HIGH (ref 70–99)
Potassium: 3.4 mEq/L — ABNORMAL LOW (ref 3.7–5.3)
Sodium: 146 mEq/L (ref 137–147)

## 2013-03-16 LAB — GLUCOSE, CAPILLARY
Glucose-Capillary: 199 mg/dL — ABNORMAL HIGH (ref 70–99)
Glucose-Capillary: 144 mg/dL — ABNORMAL HIGH (ref 70–99)
Glucose-Capillary: 109 mg/dL — ABNORMAL HIGH (ref 70–99)
Glucose-Capillary: 123 mg/dL — ABNORMAL HIGH (ref 70–99)

## 2013-03-16 LAB — CULTURE, BLOOD (ROUTINE X 2): Culture: NO GROWTH

## 2013-03-16 MED ORDER — METOPROLOL SUCCINATE ER 25 MG PO TB24
125.0000 mg | ORAL_TABLET | Freq: Every day | ORAL | Status: DC
  Administered 2013-03-16 – 2013-03-20 (×5): 125 mg via ORAL
  Filled 2013-03-16 (×2): qty 2
  Filled 2013-03-16: qty 1
  Filled 2013-03-16: qty 2
  Filled 2013-03-16: qty 1
  Filled 2013-03-16 (×2): qty 2
  Filled 2013-03-16 (×3): qty 1

## 2013-03-16 MED ORDER — POTASSIUM CHLORIDE 20 MEQ/15ML (10%) PO LIQD
40.0000 meq | Freq: Two times a day (BID) | ORAL | Status: DC
  Administered 2013-03-16 – 2013-03-17 (×3): 40 meq via ORAL
  Filled 2013-03-16 (×3): qty 30

## 2013-03-16 MED ORDER — SODIUM CHLORIDE 0.9 % IV SOLN
INTRAVENOUS | Status: DC
  Administered 2013-03-16 – 2013-03-17 (×3): via INTRAVENOUS

## 2013-03-16 MED ORDER — LEVOFLOXACIN 500 MG PO TABS
500.0000 mg | ORAL_TABLET | Freq: Every day | ORAL | Status: DC
  Administered 2013-03-16 – 2013-03-17 (×2): 500 mg via ORAL
  Filled 2013-03-16 (×2): qty 1

## 2013-03-16 MED ORDER — ALBUTEROL SULFATE (2.5 MG/3ML) 0.083% IN NEBU: 2.5000 mg | INHALATION_SOLUTION | Freq: Four times a day (QID) | RESPIRATORY_TRACT | Status: DC | PRN

## 2013-03-16 NOTE — Telephone Encounter (Signed)
/  m notifying patient of appt. with dr. Trula Slade on 04-20-13 10 am, mailed np information

## 2013-03-16 NOTE — Progress Notes (Signed)
Speech Language Pathology Treatment: Cognitive-Linquistic - Aphasia  Patient Details Name: [name] MRN: [id number] DOB: 1945-07-26 Today's Date: 03/16/2013 Time: 7782-4235 SLP Time Calculation (min): 45 min  Assessment / Plan / Recommendation Clinical Impression  [name] was seen for ongoing diagnostic and therapeutic aphasia intervention at bedside. Pt last seen on Thursday and now demonstrates improved speech/language skills. Moderate/Severe expressive aphasia and moderate receptive aphasia persist characterized by decreased word finding, decreased spontaneous speech (decreased content and fluency), difficulty with confrontation naming (negatively impacted by perseveration of previous responses) and responsive naming, and decreased auditory and reading comprehension.   [name] is now verbal and generally able to respond in single word utterances and negotiate automatic speech for greetings and self care. He is also following 1-step (and some 2-step) commands with greater consistency. His greatest barrier to effective communication is perseveration. This is evident in both expressive and receptive language areas. He starts out naming items correctly (2-3 items) and then perseverates on a previous response with poor error awareness. He was able to follow several 1-step commands, but then perseverates on previous directions. He also has difficulty initiating speech and does well with sentence completion tasks and when provided a model (ie. For automatics-counting, days of week...). [name] benefits from repetition and gestural cues to increase comprehension. SLP will follow while in acute setting. Pt needs continued SLP services in next venue.   HPI HPI: [name] to the hospital with a left basal ganglia infarct.  He has a hx of right brain stroke, CAD, DM, HTN.  He lives with a roommate and I have no information as to prior functional status.  At the time of admission  he displayed right sided weakness, expressive aphasia and left gaze preference.       SLP Plan  Goals updated    Recommendations  SNF vs Home Health vs Outpatient SLP therapy              Follow up Recommendations: Skilled Nursing facility;Home health SLP;24 hour supervision/assistance Plan: Goals updated       Thank you,  Genene Churn, [place]  [place] 03/16/2013, 11:05 AM

## 2013-03-16 NOTE — Progress Notes (Signed)
Primary cardiologist: Dr. Minus Breeding  Subjective:    Largely aphasic. Does appear alert and responds to questions by nodding his head. He does not endorse any chest pain.  Objective:   Temp:  [97.3 F (36.3 C)-98.9 F (37.2 C)] 97.3 F (36.3 C) (03/16 0904) Pulse Rate:  [41-120] 82 (03/16 0900) Resp:  [16-32] 21 (03/16 0900) BP: (102-151)/(69-112) 129/83 mmHg (03/16 0900) SpO2:  [85 %-98 %] 98 % (03/16 0900) Weight:  [167 lb 5.3 oz (75.9 kg)] 167 lb 5.3 oz (75.9 kg) (03/16 0500) Last BM Date: 03/10/13  Filed Weights   03/14/13 0500 03/15/13 0500 03/16/13 0500  Weight: 168 lb 12.8 oz (76.567 kg) 169 lb 12.1 oz (77 kg) 167 lb 5.3 oz (75.9 kg)    Intake/Output Summary (Last 24 hours) at 03/16/13 1047 Last data filed at 03/16/13 0600  Gross per 24 hour  Intake    960 ml  Output   1225 ml  Net   -265 ml    Telemetry: Atrial flutter with controlled intraocular response.  Exam:  General: Chronically ill-appearing, no distress.  Lungs: Coarse breath sounds with scattered rhonchi.  Cardiac: Distant, irregular.  Extremities: No pitting edema.   Lab Results:  Basic Metabolic Panel:  Recent Labs Lab 03/11/13 0135 03/13/13 0550 03/16/13 0507  NA 139 141 146  K 3.9 3.4* 3.4*  CL 102 104 108  CO2 23 21 22   GLUCOSE 174* 150* 142*  BUN 16 12 37*  CREATININE 1.10 0.91 1.59*  CALCIUM 9.5 9.2 9.0    CBC:  Recent Labs Lab 03/11/13 0135  WBC 7.1  HGB 14.3  HCT 43.9  MCV 82.7  PLT 170    Cardiac Enzymes:  Recent Labs Lab 03/11/13 0135  TROPONINI <0.30     Medications:   Scheduled Medications: . aspirin  300 mg Rectal Daily   Or  . aspirin  325 mg Oral Daily  . atorvastatin  80 mg Oral q1800  . diltiazem  120 mg Oral Daily  . enoxaparin (LOVENOX) injection  40 mg Subcutaneous Q24H  . feeding supplement (GLUCERNA SHAKE)  237 mL Oral BID BM  . insulin aspart  0-9 Units Subcutaneous TID WC  . levofloxacin  500 mg Oral Daily  . metoprolol  succinate  125 mg Oral Daily  . nicotine  21 mg Transdermal Daily  . potassium chloride  40 mEq Oral BID     Infusions: . sodium chloride 100 mL/hr at 03/16/13 0933     PRN Medications:  albuterol   Assessment:   1. Atrial flutter. Management strategy has been rate control, no anticoagulation with history of previous intracranial bleed, noncompliance, and falls.  2. Recent stroke, left hemispheric, evident by head CT with evidence of prior stroke as well.  3. Descending thoracic aortic aneurysm, 5.7 cm.  4. Multivessel CAD status post CABG in 2004 with DES the proximal RCA in 2006. LVEF 50-55% by recent echocardiogram.  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  6. Left apical lung nodule, described as irregular-shaped and enlarging by chest CT, concerning for malignancy.  7. Moderate aortic stenosis.  Plan/Discussion:    Current medications include Toprol-XL 125 mg daily, Cardizem CD 120 mg daily, Lipitor, and aspirin. Rate control adequate this morning. Will follow for further adjustments. As already noted, he is not a good candidate for anticoagulation despite recent stroke. He has other significant comorbidities with workup pending as per hospitalist service  note as well. Overall, prognosis is poor however. Not entirely clear how aggressively these other issues will be pursued.   Satira Sark, M.D., F.A.C.C.

## 2013-03-16 NOTE — Progress Notes (Addendum)
[facility] PROGRESS NOTE  [name] ZJQ:[id number] DOB: 1945-08-28 DOA: 03/11/2013 PCP: Carolee Rota, NP  Assessment/Plan: 68 year old male with past medical history of CAD, HTN, DM, CHF, PAF not on AC (bleeding, fall), h/o CVA, cerebral hemorrhage, Noncompliance presented to the ED with chief complaint of weakness of right side and inability to speak since 1 day with findings of bilateral stroke on MRI.   1. Acute CVA; Moderate to large left hemispheric infarct presenting with severe aphasia and mild right hemiparesis; patient evaluated by neurology  -Continue with full dose aspirin, Continue Lipitor; not a candidate for anticoagulation at this time  2. A. fib with RVR  -increased metoprolol 125 BID, cont Cardizem  3. Diabetes mellitus; HA1C- 6.6; d/c metformin due to AKI/CKD -Continue on sliding scale insulin  4. Hypertension  Allow permissive hypertension  5. Left upper lobe long nodule likely lung CA -CT chest shows increase in size of LUL nodule concerning for malignancy. Patient is a heavy smoker and smokes 2PPD at present. Also shows a right adrenal mass concerning for mets.  -Will need PET as outpt before deciding for tissue diagnosis. Dr. Clementeen Graham informed PCP about the findings on 3/13 who will arrange for outpt follow up and PET scan after discharge.  6. Descending thoracic aortic aneurysm  -CT showed increase in size of descending thoracic aneurysm from 4.3 cm in 2012 to 5.7 cm.  discussed findings with vascular surgery Dr. Trula Slade also discussed about 75% stenosis of right carotid artery. He will arrange for outpt follow up in his office uopn discharge.  7. Protein calorie malnutrition Continue supplements.  8. AKI, likely prerenal; started IVF; recheck renal function in AM  9. Probable COPD, tobacco use; mild wheezing on exam; CXR: probable infiltrate vs atelectasis;  -cont bronchodilators, empiric PO atx;   Overall prognosis remains poor with probable  underlying Lung CA, recurrent CVA -may  Benefit from hospice care in near future   [facility] 3/16  Code Status: full Family Communication: d/w patient, Patient has an estranged sister with whom he is no longer in contact. He also has 2 sons both of whom are not been contact with him for many years. patient lives with a family friend and his wife.  (indicate person spoken with, relationship, and if by phone, the number) Disposition Plan: pend clinical improvement;    Consultants:  Neurology  Cardiology   Procedures:  None   Antibiotics:  None  (indicate start date, and stop date if known)  HPI/Subjective: Alert, aphasia   Objective: Filed Vitals:   03/16/13 0904  BP:   Pulse:   Temp: 97.3 F (36.3 C)  Resp:     Intake/Output Summary (Last 24 hours) at 03/16/13 0951 Last data filed at 03/16/13 0600  Gross per 24 hour  Intake    960 ml  Output   1225 ml  Net   -265 ml   Filed Weights   03/14/13 0500 03/15/13 0500 03/16/13 0500  Weight: 76.567 kg (168 lb 12.8 oz) 77 kg (169 lb 12.1 oz) 75.9 kg (167 lb 5.3 oz)    Exam:   General:  Aphasia   Cardiovascular: s1,s2 rrr  Respiratory: CTA BL  Abdomen: soft, nt,nd   Musculoskeletal: no LE edema   Data Reviewed: Basic Metabolic Panel:  Recent Labs Lab 03/11/13 0135 03/13/13 0550 03/16/13 0507  NA 139 141 146  K 3.9 3.4* 3.4*  CL 102 104 108  CO2 23 21 22   GLUCOSE 174* 150* 142*  BUN 16  12 37*  CREATININE 1.10 0.91 1.59*  CALCIUM 9.5 9.2 9.0   Liver Function Tests:  Recent Labs Lab 03/11/13 0135  AST 12  ALT 10  ALKPHOS 96  BILITOT 0.6  PROT 7.5  ALBUMIN 3.5   No results found for this basename: LIPASE, AMYLASE,  in the last 168 hours No results found for this basename: AMMONIA,  in the last 168 hours CBC:  Recent Labs Lab 03/11/13 0135  WBC 7.1  NEUTROABS 5.4  HGB 14.3  HCT 43.9  MCV 82.7  PLT 170   Cardiac Enzymes:  Recent Labs Lab 03/11/13 0135  TROPONINI <0.30   BNP  (last 3 results)  Recent Labs  06/05/12 0459 11/01/12 0056 11/04/12 2131  PROBNP 4068.0* 7093.0* 6884.0*   CBG:  Recent Labs Lab 03/15/13 0800 03/15/13 1134 03/15/13 1637 03/15/13 2214 03/16/13 0830  GLUCAP 181* 180* 124* 106* 199*    Recent Results (from the past 240 hour(s))  CULTURE, BLOOD (ROUTINE X 2)     Status: None   Collection Time    03/11/13  2:13 AM      Result Value Ref Range Status   Specimen Description BLOOD RIGHT ANTECUBITAL   Final   Special Requests     Final   Value: BOTTLES DRAWN AEROBIC AND ANAEROBIC AEB=4CC ANA=6CC   Culture NO GROWTH 4 DAYS   Final   Report Status PENDING   Incomplete  CULTURE, BLOOD (ROUTINE X 2)     Status: None   Collection Time    03/11/13  2:13 AM      Result Value Ref Range Status   Specimen Description BLOOD RIGHT HAND   Final   Special Requests BOTTLES DRAWN AEROBIC AND ANAEROBIC 5CC EACH   Final   Culture  Setup Time     Final   Value: 03/12/2013 14:12     Performed at [facility]   Culture     Final   Value: STAPHYLOCOCCUS SPECIES (COAGULASE NEGATIVE)     Note: THE SIGNIFICANCE OF ISOLATING THIS ORGANISM FROM A SINGLE SET OF BLOOD CULTURES WHEN MULTIPLE SETS ARE DRAWN IS UNCERTAIN. PLEASE NOTIFY THE MICROBIOLOGY DEPARTMENT WITHIN ONE WEEK IF SPECIATION AND SENSITIVITIES ARE REQUIRED.     Note: Gram Stain Report Called to,Read Back By and Verified With: MCDANIEL M AT 0040 ON [id number] BY FORSYTH K Performed at [facility]     Performed at [facility]   Report Status 03/13/2013 FINAL   Final  MRSA PCR SCREENING     Status: None   Collection Time    03/11/13  4:16 AM      Result Value Ref Range Status   MRSA by PCR NEGATIVE  NEGATIVE Final   Comment:            The GeneXpert MRSA Assay (FDA     approved for NASAL specimens     only), is one component of a     comprehensive MRSA colonization     surveillance program. It is not     intended to diagnose MRSA     infection nor to guide or      monitor treatment for     MRSA infections.  CULTURE, BLOOD (ROUTINE X 2)     Status: None   Collection Time    03/12/13  8:30 AM      Result Value Ref Range Status   Specimen Description BLOOD RIGHT HAND   Final   Special Requests BOTTLES DRAWN  AEROBIC AND ANAEROBIC 4CC   Final   Culture NO GROWTH 3 DAYS   Final   Report Status PENDING   Incomplete  CULTURE, BLOOD (ROUTINE X 2)     Status: None   Collection Time    03/12/13  8:30 AM      Result Value Ref Range Status   Specimen Description BLOOD RIGHT HAND   Final   Special Requests BOTTLES DRAWN AEROBIC AND ANAEROBIC 4CC   Final   Culture NO GROWTH 3 DAYS   Final   Report Status PENDING   Incomplete  URINE CULTURE     Status: None   Collection Time    03/14/13  1:20 AM      Result Value Ref Range Status   Specimen Description URINE, CLEAN CATCH   Final   Special Requests NONE   Final   Culture  Setup Time     Final   Value: 03/14/2013 19:12     Performed at      Final   Value: 4,000 COLONIES/ML     Performed at [facility]   Culture     Final   Value: INSIGNIFICANT GROWTH     Performed at [facility]   Report Status 03/15/2013 FINAL   Final     Studies: Dg Chest Port 1 View  03/16/2013   CLINICAL DATA:  Shortness of breath.  EXAM: PORTABLE CHEST - 1 VIEW  COMPARISON:  CT CHEST W/CM dated 03/13/2013; DG CHEST 1 VIEW dated 03/11/2013  FINDINGS: Sequelae of prior CABG are again identified. Enlargement of the cardiac silhouette is unchanged. Thoracic aortic calcification is noted. Irregular opacity in the left lung apex is better evaluated on recent CT. There is slightly increased linear opacity in the left lung base compared to the prior radiograph, and there is also mildly increased opacity in the right lung base. No large pleural effusion or pneumothorax is identified. No acute osseous abnormality is identified.  IMPRESSION: Increased linear opacity in the left lung base, likely  atelectasis. Slightly increased right basilar opacity may also represent atelectasis although developing infiltrate is not excluded.   Electronically Signed   By: Logan Bores   On: 03/16/2013 08:54    Scheduled Meds: . aspirin  300 mg Rectal Daily   Or  . aspirin  325 mg Oral Daily  . atorvastatin  80 mg Oral q1800  . diltiazem  120 mg Oral Daily  . enoxaparin (LOVENOX) injection  40 mg Subcutaneous Q24H  . feeding supplement (GLUCERNA SHAKE)  237 mL Oral BID BM  . insulin aspart  0-9 Units Subcutaneous TID WC  . metoprolol succinate  100 mg Oral Daily  . nicotine  21 mg Transdermal Daily  . potassium chloride  40 mEq Oral BID   Continuous Infusions: . sodium chloride 100 mL/hr at 03/16/13 7124    Principal Problem:   Stroke Active Problems:   Hypertension   Atypical atrial flutter   Diabetes mellitus   CVA (cerebral infarction)   Solitary pulmonary nodule   Thoracic aneurysm without mention of rupture   Adrenal mass, right   Lung mass    Time spent: >35 minutes     Kinnie Feil  [facility] Pager [phone]. If 7PM-7AM, please contact night-coverage at [email or web address], password [facility] 03/16/2013, 9:51 AM  LOS: 5 days

## 2013-03-16 NOTE — Progress Notes (Signed)
Dr. Merlene Laughter notified of consult.

## 2013-03-17 ENCOUNTER — Inpatient Hospital Stay (HOSPITAL_COMMUNITY): Payer: PRIVATE HEALTH INSURANCE

## 2013-03-17 DIAGNOSIS — N318 Other neuromuscular dysfunction of bladder: Secondary | ICD-10-CM

## 2013-03-17 LAB — GLUCOSE, CAPILLARY
GLUCOSE-CAPILLARY: 244 mg/dL — AB (ref 70–99)
Glucose-Capillary: 175 mg/dL — ABNORMAL HIGH (ref 70–99)
Glucose-Capillary: 184 mg/dL — ABNORMAL HIGH (ref 70–99)
Glucose-Capillary: 234 mg/dL — ABNORMAL HIGH (ref 70–99)

## 2013-03-17 LAB — BASIC METABOLIC PANEL
BUN: 42 mg/dL — ABNORMAL HIGH (ref 6–23)
CALCIUM: 9.2 mg/dL (ref 8.4–10.5)
CHLORIDE: 113 meq/L — AB (ref 96–112)
CO2: 21 meq/L (ref 19–32)
Creatinine, Ser: 1.6 mg/dL — ABNORMAL HIGH (ref 0.50–1.35)
GFR calc Af Amer: 49 mL/min — ABNORMAL LOW (ref >90)
GFR calc non Af Amer: 42 mL/min — ABNORMAL LOW (ref >90)
GLUCOSE: 160 mg/dL — AB (ref 70–99)
Potassium: 4.2 mEq/L (ref 3.7–5.3)
SODIUM: 148 meq/L — AB (ref 137–147)

## 2013-03-17 LAB — CBC
HEMATOCRIT: 52.3 % — AB (ref 39.0–52.0)
Hemoglobin: 17 g/dL (ref 13.0–17.0)
MCH: 26.8 pg (ref 26.0–34.0)
MCHC: 32.5 g/dL (ref 30.0–36.0)
MCV: 82.4 fL (ref 78.0–100.0)
Platelets: 171 10*3/uL (ref 150–400)
RBC: 6.35 MIL/uL — AB (ref 4.22–5.81)
RDW: 16.4 % — ABNORMAL HIGH (ref 11.5–15.5)
WBC: 14.2 10*3/uL — ABNORMAL HIGH (ref 4.0–10.5)

## 2013-03-17 LAB — CULTURE, BLOOD (ROUTINE X 2)
CULTURE: NO GROWTH
CULTURE: NO GROWTH

## 2013-03-17 MED ORDER — HEPARIN SODIUM (PORCINE) 5000 UNIT/ML IJ SOLN
5000.0000 [IU] | Freq: Three times a day (TID) | INTRAMUSCULAR | Status: DC
  Administered 2013-03-17 – 2013-03-20 (×10): 5000 [IU] via SUBCUTANEOUS
  Filled 2013-03-17 (×8): qty 1

## 2013-03-17 MED ORDER — TAMSULOSIN HCL 0.4 MG PO CAPS
0.4000 mg | ORAL_CAPSULE | Freq: Every day | ORAL | Status: DC
  Administered 2013-03-17 – 2013-03-19 (×3): 0.4 mg via ORAL
  Filled 2013-03-17 (×3): qty 1

## 2013-03-17 MED ORDER — DILTIAZEM HCL ER COATED BEADS 180 MG PO CP24
180.0000 mg | ORAL_CAPSULE | Freq: Every day | ORAL | Status: DC
  Administered 2013-03-17 – 2013-03-20 (×4): 180 mg via ORAL
  Filled 2013-03-17 (×4): qty 1

## 2013-03-17 NOTE — Progress Notes (Signed)
SLP Cancellation Note  Patient Details Name: [name] MRN: [id number] DOB: June 08, 1945   Cancelled treatment:       Reason Eval/Treat Not Completed: Patient at procedure or test/unavailable. Urologist needed to see pt.   Corderro Koloski 03/17/2013, 7:35 PM

## 2013-03-17 NOTE — Progress Notes (Signed)
Consulting cardiologist: Rozann Lesches MD Primary Cardiologist: Minus Breeding MD  Subjective:    Denies chest pain or shortness of breath. Aphasic.  Objective:   Temp:  [97.1 F (36.2 C)-97.9 F (36.6 C)] 97.1 F (36.2 C) (03/17 0500) Pulse Rate:  [70-100] 88 (03/17 0747) Resp:  [20-30] 20 (03/17 0747) BP: (127-181)/(74-104) 164/86 mmHg (03/17 0500) SpO2:  [96 %-100 %] 97 % (03/17 0747) Last BM Date: 03/10/13  Filed Weights   03/15/13 0500 03/16/13 0500 03/16/13 1107  Weight: 169 lb 12.1 oz (77 kg) 167 lb 5.3 oz (75.9 kg) 167 lb 8.8 oz (76 kg)    Intake/Output Summary (Last 24 hours) at 03/17/13 1122 Last data filed at 03/17/13 0830  Gross per 24 hour  Intake    945 ml  Output   2650 ml  Net  -1705 ml    Telemetry: Atrial fib in the 90's.   Exam:  General: Frail, ill-appearing, no acute distress.   Lungs: Upper airway wheezes. Diminished in the bases.   Cardiac: No elevated JVP or bruits. IRRR, no gallop or rub.   Extremities: No pitting edema, distal pulses full.   Lab Results:  Basic Metabolic Panel:  Recent Labs Lab 03/13/13 0550 03/16/13 0507 03/17/13 0532  NA 141 146 148*  K 3.4* 3.4* 4.2  CL 104 108 113*  CO2 21 22 21   GLUCOSE 150* 142* 160*  BUN 12 37* 42*  CREATININE 0.91 1.59* 1.60*  CALCIUM 9.2 9.0 9.2    Liver Function Tests:  Recent Labs Lab 03/11/13 0135  AST 12  ALT 10  ALKPHOS 96  BILITOT 0.6  PROT 7.5  ALBUMIN 3.5    CBC:  Recent Labs Lab 03/11/13 0135 03/17/13 0532  WBC 7.1 14.2*  HGB 14.3 17.0  HCT 43.9 52.3*  MCV 82.7 82.4  PLT 170 171    Cardiac Enzymes:  Recent Labs Lab 03/11/13 0135  TROPONINI <0.30    BNP:  Recent Labs  06/05/12 0459 11/01/12 0056 11/04/12 2131  PROBNP 4068.0* 7093.0* 6884.0*    Coagulation:  Recent Labs Lab 03/11/13 0135  INR 1.12    Radiology: Dg Chest Port 1 View  03/16/2013   CLINICAL DATA:  Shortness of breath.  EXAM: PORTABLE CHEST - 1 VIEW   COMPARISON:  CT CHEST W/CM dated 03/13/2013; DG CHEST 1 VIEW dated 03/11/2013  FINDINGS: Sequelae of prior CABG are again identified. Enlargement of the cardiac silhouette is unchanged. Thoracic aortic calcification is noted. Irregular opacity in the left lung apex is better evaluated on recent CT. There is slightly increased linear opacity in the left lung base compared to the prior radiograph, and there is also mildly increased opacity in the right lung base. No large pleural effusion or pneumothorax is identified. No acute osseous abnormality is identified.  IMPRESSION: Increased linear opacity in the left lung base, likely atelectasis. Slightly increased right basilar opacity may also represent atelectasis although developing infiltrate is not excluded.   Electronically Signed   By: Logan Bores   On: 03/16/2013 08:54     Medications:   Scheduled Medications: . aspirin  300 mg Rectal Daily   Or  . aspirin  325 mg Oral Daily  . atorvastatin  80 mg Oral q1800  . diltiazem  180 mg Oral Daily  . enoxaparin (LOVENOX) injection  40 mg Subcutaneous Q24H  . feeding supplement (GLUCERNA SHAKE)  237 mL Oral BID BM  . insulin aspart  0-9 Units Subcutaneous TID WC  .  levofloxacin  500 mg Oral Daily  . metoprolol succinate  125 mg Oral Daily  . nicotine  21 mg Transdermal Daily  . potassium chloride  40 mEq Oral BID  . tamsulosin  0.4 mg Oral QPC supper    Infusions: . sodium chloride 100 mL/hr at 03/16/13 2254    PRN Medications: albuterol   Assessment and Plan:   1. Atrial flutter: Rate essentially controlled. Apical pulse 78 bpm. Telemetry demonstrates HR upwards of 92 bpm. He remains on Metoprolol 125 mg QD, Diltiazem 120mg  QD, and ASA. Not a candidate for anticoagulation with history of intracranial bleeding.   2. Left Hemispheric CVA: Right sided weakness is not severe, he is aphasic.   3. CAD: CABG in 2004 with DES the proximal RCA in 2006. LVEF 50-55% by recent echocardiogram.  Continues on statin, ASA, and BB. No aggressive testing planned this admission.   4. Atherosclerotic thoracocervical arterial disease: This includes 70% proximal left subclavian, 60% right vertebral, 75% RICA  5. Abnormal CT of Chest: Left apical lung nodule, described as irregular-shaped concerning for malignancy Follow up per PCP.   Phill Myron. Purcell Nails NP [facility] 03/17/2013, 11:22 AM   Attending note:  Patient seen and examined. Modified above note by Ms. Lawrence NP. Please see my rounding note from yesterday as well. From a cardiac perspective he remains stable in terms of rate control strategy for atrial flutter. As already noted, we do not plan oral anticoagulation and will continue aspirin. He has significant comorbid illnesses with overall poor prognosis.  Satira Sark, M.D., F.A.C.C.

## 2013-03-17 NOTE — Progress Notes (Signed)
[facility] PROGRESS NOTE  NESTA CUDE [id number] DOB: 1945-02-14 DOA: 03/11/2013 PCP: Carolee Rota, NP  Brief narrative  68 year old male with past medical history of coronary atherosclerosis of native coronary artery, Type 2 diabetes mellitus, Hyperlipidemia; Essential hypertension, benign, Atypical atrial flutter, Ischemic cardiomyopathy, Aortic stenosis, , hx of cerebral hemorrhage, and and Noncompliance presented to the ED with chief complaint of weakness of right side and inability to speak since 1 day with findings of bilateral stroke on MRI.   Assessment/Plan:  Acute stroke  Patient has bilateral stroke on head CT and is aphasic.  Continue to monitor in ICU. MRI of the brain , MRA head still pending???. needs  to evaluate extent of stroke and r/o any mets). Need to check for any brain mets.  Patient recently had a 2-D echo showed low normal EF with hypokinesis of basal inferior lateral myocardium and grade 2 diastolic dysfunction.. Given multiple areas of infarct may need TEE but since he is not a candidate for any anticoagulation or intervention will hold off on TEE.  -Continue with full dose aspirin.  -Carotid Doppler showing extensive amount of bilateral atherosclerotic plaque. CT angio of the neck again shows extensive carotid stenosis bilaterally.  - neurology consult appreciated -Continue Lipitor  - speech , PT/ OT eval.    A. fib with RVR  -Rate controlled on metoprolol and Cardizem. Cardizem dose increased this morning for better blood pressure control.  Diabetes mellitus  . Continue on sliding scale insulin  A1C of 6.6   Hypertension  Adjusted dose of metoprolol and Cardizem.  Left upper lobe long nodule  CT angio neck shows an enlarging LUL lung nodule. ( last seen in 2012 with size of 2.4 x 1 .5 cm), given underlying COPD high likely hood for lung ca.  CT chest shows increase in size of LUL nodule concerning for malignancy. Patient is a heavy smoker  and smokes 2PPD at present. Also shows a right adrenal mass concerning for mets.  Will need PET as outpt before deciding for tissue diagnosis. MRI of the brain pending to rule out metastases. (Given worsening renal function today we'll hold off on daily improved. Cannot use contrast at present) informed PCP about the findings on 3/13 who will arrange for outpt follow up and PET scan after discharge.   Descending thoracic aortic aneurysm  CT showed increase in size of descending thoracic aneurysm from 4.3 cm in 2012 to 5.7 cm.  discussed findings with vascular surgery Dr. Trula Slade also discussed about 75% stenosis of right carotid artery. He will arrange for outpt follow up in his office uopn discharge.   Protein calorie malnutrition, severe Continue supplements.   Bladder outlet obstruction Patient had increaed urinary retention today on bladder scan despite in and out cath. Noted for worsening renal function likely due to outflow obstruction . Foley placed in. Monitor I/O. Check renal ultrasound. -Will add empiric Flomax. -Urology consulted.  Tobacco abuse  Counseled on smoking cessation. Will order nicotine patch  Code Status: full  Family Communication: discussed with family friend melinda at bedside on 3/13. Patient has an estranged sister with whom he is no longer in contact. He also has 2 sons both of whom are not been contact with him for many years. patient lives with a family friend and his wife.  Disposition Plan: home with [facility] once renal fn improves ( needs MRI brain with contrast prior to discharge to r/o mets)  Consultants:  Neurology  Cardiology Urology consulted  Procedures:  None   Antibiotics:  None  HPI/Subjective:  Patient seen and examined this morning. Reported to have increased urinary retention on bladder scan.  Objective: Filed Vitals:   03/17/13 0747  BP:   Pulse: 88  Temp:   Resp: 20    Intake/Output Summary (Last 24 hours) at 03/17/13 1359 Last  data filed at 03/17/13 1340  Gross per 24 hour  Intake    885 ml  Output   5650 ml  Net  -4765 ml   Filed Weights   03/15/13 0500 03/16/13 0500 03/16/13 1107  Weight: 77 kg (169 lb 12.1 oz) 75.9 kg (167 lb 5.3 oz) 76 kg (167 lb 8.8 oz)    Exam:  General: Elderly male lying in bed in no acute distress  HEENT: No pallor, moist oral mucosa,  Chest: clear breath sounds bilaterally  Cardiovascular: S1 and S2 irregularly irregular  Abdomen: Soft, nontender, nondistended, bowel sounds present , palpable mass over the right middle quadrant measuring 6x 6 cm with mild tednerness  Musculoskeletal: Warm, no edema  CNS: aphasic, responds to simple commands,normal power in all extremities   Data Reviewed: Basic Metabolic Panel:  Recent Labs Lab 03/11/13 0135 03/13/13 0550 03/16/13 0507 03/17/13 0532  NA 139 141 146 148*  K 3.9 3.4* 3.4* 4.2  CL 102 104 108 113*  CO2 23 21 22 21   GLUCOSE 174* 150* 142* 160*  BUN 16 12 37* 42*  CREATININE 1.10 0.91 1.59* 1.60*  CALCIUM 9.5 9.2 9.0 9.2   Liver Function Tests:  Recent Labs Lab 03/11/13 0135  AST 12  ALT 10  ALKPHOS 96  BILITOT 0.6  PROT 7.5  ALBUMIN 3.5   No results found for this basename: LIPASE, AMYLASE,  in the last 168 hours No results found for this basename: AMMONIA,  in the last 168 hours CBC:  Recent Labs Lab 03/11/13 0135 03/17/13 0532  WBC 7.1 14.2*  NEUTROABS 5.4  --   HGB 14.3 17.0  HCT 43.9 52.3*  MCV 82.7 82.4  PLT 170 171   Cardiac Enzymes:  Recent Labs Lab 03/11/13 0135  TROPONINI <0.30   BNP (last 3 results)  Recent Labs  06/05/12 0459 11/01/12 0056 11/04/12 2131  PROBNP 4068.0* 7093.0* 6884.0*   CBG:  Recent Labs Lab 03/16/13 1105 03/16/13 1629 03/16/13 2040 03/17/13 0721 03/17/13 1117  GLUCAP 144* 109* 123* 175* 184*    Recent Results (from the past 240 hour(s))  CULTURE, BLOOD (ROUTINE X 2)     Status: None   Collection Time    03/11/13  2:13 AM      Result Value  Ref Range Status   Specimen Description BLOOD RIGHT ANTECUBITAL   Final   Special Requests     Final   Value: BOTTLES DRAWN AEROBIC AND ANAEROBIC AEB=4CC ANA=6CC   Culture NO GROWTH 5 DAYS   Final   Report Status 03/16/2013 FINAL   Final  CULTURE, BLOOD (ROUTINE X 2)     Status: None   Collection Time    03/11/13  2:13 AM      Result Value Ref Range Status   Specimen Description BLOOD RIGHT HAND   Final   Special Requests BOTTLES DRAWN AEROBIC AND ANAEROBIC [facility] EACH   Final   Culture  Setup Time     Final   Value: 03/12/2013 14:12     Performed at [facility]   Culture     Final   Value: STAPHYLOCOCCUS SPECIES (COAGULASE NEGATIVE)  Note: THE SIGNIFICANCE OF ISOLATING THIS ORGANISM FROM A SINGLE SET OF BLOOD CULTURES WHEN MULTIPLE SETS ARE DRAWN IS UNCERTAIN. PLEASE NOTIFY THE MICROBIOLOGY DEPARTMENT WITHIN ONE WEEK IF SPECIATION AND SENSITIVITIES ARE REQUIRED.     Note: Gram Stain Report Called to,Read Back By and Verified With: MCDANIEL M AT 0040 ON [id number] BY FORSYTH K Performed at [facility]     Performed at [facility]   Report Status 03/13/2013 FINAL   Final  MRSA PCR SCREENING     Status: None   Collection Time    03/11/13  4:16 AM      Result Value Ref Range Status   MRSA by PCR NEGATIVE  NEGATIVE Final   Comment:            The GeneXpert MRSA Assay (FDA     approved for NASAL specimens     only), is one component of a     comprehensive MRSA colonization     surveillance program. It is not     intended to diagnose MRSA     infection nor to guide or     monitor treatment for     MRSA infections.  CULTURE, BLOOD (ROUTINE X 2)     Status: None   Collection Time    03/12/13  8:30 AM      Result Value Ref Range Status   Specimen Description BLOOD RIGHT HAND   Final   Special Requests BOTTLES DRAWN AEROBIC AND ANAEROBIC 4CC   Final   Culture NO GROWTH 5 DAYS   Final   Report Status 03/17/2013 FINAL   Final  CULTURE, BLOOD (ROUTINE X 2)      Status: None   Collection Time    03/12/13  8:30 AM      Result Value Ref Range Status   Specimen Description BLOOD RIGHT HAND   Final   Special Requests BOTTLES DRAWN AEROBIC AND ANAEROBIC 4CC   Final   Culture NO GROWTH 5 DAYS   Final   Report Status 03/17/2013 FINAL   Final  URINE CULTURE     Status: None   Collection Time    03/14/13  1:20 AM      Result Value Ref Range Status   Specimen Description URINE, CLEAN CATCH   Final   Special Requests NONE   Final   Culture  Setup Time     Final   Value: 03/14/2013 19:12     Performed at [facility]     Final   Value: 4,000 COLONIES/ML     Performed at [facility]   Culture     Final   Value: INSIGNIFICANT GROWTH     Performed at [facility]   Report Status 03/15/2013 FINAL   Final     Studies: Dg Chest Port 1 View  03/16/2013   CLINICAL DATA:  Shortness of breath.  EXAM: PORTABLE CHEST - 1 VIEW  COMPARISON:  CT CHEST W/CM dated 03/13/2013; DG CHEST 1 VIEW dated 03/11/2013  FINDINGS: Sequelae of prior CABG are again identified. Enlargement of the cardiac silhouette is unchanged. Thoracic aortic calcification is noted. Irregular opacity in the left lung apex is better evaluated on recent CT. There is slightly increased linear opacity in the left lung base compared to the prior radiograph, and there is also mildly increased opacity in the right lung base. No large pleural effusion or pneumothorax is identified. No acute osseous abnormality is identified.  IMPRESSION: Increased linear opacity in the left lung base, likely atelectasis. Slightly increased right basilar opacity may also represent atelectasis although developing infiltrate is not excluded.   Electronically Signed   By: Logan Bores   On: 03/16/2013 08:54    Scheduled Meds: . aspirin  300 mg Rectal Daily   Or  . aspirin  325 mg Oral Daily  . atorvastatin  80 mg Oral q1800  . diltiazem  180 mg Oral Daily  . enoxaparin (LOVENOX)  injection  40 mg Subcutaneous Q24H  . feeding supplement (GLUCERNA SHAKE)  237 mL Oral BID BM  . insulin aspart  0-9 Units Subcutaneous TID WC  . levofloxacin  500 mg Oral Daily  . metoprolol succinate  125 mg Oral Daily  . nicotine  21 mg Transdermal Daily  . potassium chloride  40 mEq Oral BID  . tamsulosin  0.4 mg Oral QPC supper   Continuous Infusions: . sodium chloride 100 mL/hr at 03/17/13 1322       Time spent: 25 minutes    Madie Cahn, [facility] Pager [phone] If 7PM-7AM, please contact night-coverage at [email or web address], password [facility] 03/17/2013, 1:59 PM  LOS: 6 days

## 2013-03-17 NOTE — Progress Notes (Signed)
Patient bed linens saturated, abdomen distented, bladder scanned for greater than 1000cc, in and out cath done, output 2000cc amber urine, post void residual still greater than 600cc, day shift nurse notified, hospitalist text paged.

## 2013-03-17 NOTE — Progress Notes (Signed)
Occupational Therapy Treatment Patient Details Name: [name] MRN: [id number] DOB: [birth year] Today's Date: 03/17/2013 Time: 1443-1540 OT Time Calculation (min): 14 min  OT Assessment / Plan / Recommendation  History of present illness Pt indicated that he is feeling better than previoulsy during his hospital stay. Pt demonstrated greater ability to follow and understand commands this date, though still has a 'yes' preference.   OT comments  Pt is demonstrating improved receptive skills from previous session, with ability to follow commands for isometric exercises.  He became frustrated at inability to complete clock drawing, but was able to write his name. Pt would continue to benefit from skilled OT services, but home environemnt must be deemed safe prior to determining if pt is elibigle for SNF vs HHOT.   Follow Up Recommendations   (To be determined based on pt's home situation)             Frequency Min 3X/week   Progress towards OT Goals Progress towards OT goals: Progressing toward goals  Plan Other (comment)    Precautions / Restrictions Restrictions Weight Bearing Restrictions: No   Pertinent Vitals/Pain     ADL  Eating/Feeding: Supervision/safety Grooming: Brushing hair;Supervision/safety (Pt able to realize appropariate use for a comb this session, and demonstrated ability to comb hair) Where Assessed - Grooming: Unsupported sitting ADL Comments: Pt has improved cognition and aphasia this session, and was able to correctly indentify the purpose of a comb. He had no difficulty opening plastic wrap on pudding bowl and demonstrating ability to eat 3 bites with spoon with supervision.  Cognition is improving, but pt remains with increased expressive aphasia over receptive aphasia. He utteres one instance of 'ok' during session, but was otherwise nonverbal. pt was able to grasp pen and write his name with min legibility. Pt could not demonstrate ability to draw a clock,  though could draw a partial circle upon request. t could not indicate the time when asked. Pt was becoming frustrated at inability to express.    OT Diagnosis:    OT Problem List:   OT Treatment Interventions:     OT Goals(current goals can now be found in the care plan section)    Visit Information  Last OT Received On: 03/17/13 History of Present Illness: Pt indicated that he is feeling better than previoulsy during his hospital stay. Pt demonstrated greater ability to follow and understand commands this date, though still has a 'yes' preference.    Subjective Data   "ok"   Prior Functioning       Cognition  Cognition Arousal/Alertness: Awake/alert Behavior During Therapy: WFL for tasks assessed/performed Overall Cognitive Status: Impaired/Different from baseline Area of Impairment: Attention;Following commands;Problem solving Following Commands: Follows one step commands with increased time Problem Solving: Slow processing;Requires verbal cues    Mobility  Bed Mobility Overal bed mobility: Independent    Exercises  Shoulder Exercises Elbow Flexion: Both;Seated;Strengthening (Isometrics 3x 5 seconds) Elbow Extension: Both;Seated;Strengthening (isometrics 3x 5 seconds)   Balance Balance Sitting-balance support: No upper extremity supported;Feet supported Sitting balance-Leahy Scale: Normal  End of Session OT - End of Session Activity Tolerance: Patient tolerated treatment well Patient left: in bed;with call bell/phone within reach  [place], [place], OTR/L [phone]  03/17/2013, 1:36 PM

## 2013-03-17 NOTE — Progress Notes (Signed)
Looked in pt's chart to assist Dr. Michela Pitcher as pt's RN is currently working with another pt.

## 2013-03-17 NOTE — Consult Note (Signed)
Note [phone]

## 2013-03-18 ENCOUNTER — Encounter (HOSPITAL_COMMUNITY): Payer: Self-pay | Admitting: *Deleted

## 2013-03-18 ENCOUNTER — Encounter (HOSPITAL_COMMUNITY)
Admission: EM | Disposition: A | Discharge status: Home Health | Payer: Self-pay | Source: Home / Self Care | Attending: Internal Medicine

## 2013-03-18 ENCOUNTER — Inpatient Hospital Stay (HOSPITAL_COMMUNITY): Payer: PRIVATE HEALTH INSURANCE

## 2013-03-18 DIAGNOSIS — N319 Neuromuscular dysfunction of bladder, unspecified: Secondary | ICD-10-CM | POA: Diagnosis present

## 2013-03-18 DIAGNOSIS — R5381 Other malaise: Secondary | ICD-10-CM | POA: Diagnosis not present

## 2013-03-18 DIAGNOSIS — I6350 Cerebral infarction due to unspecified occlusion or stenosis of unspecified cerebral artery: Secondary | ICD-10-CM | POA: Diagnosis not present

## 2013-03-18 HISTORY — PX: CYSTOSCOPY: SHX5120

## 2013-03-18 LAB — BASIC METABOLIC PANEL
BUN: 34 mg/dL — AB (ref 6–23)
CO2: 23 mEq/L (ref 19–32)
CREATININE: 1.19 mg/dL (ref 0.50–1.35)
Calcium: 9.2 mg/dL (ref 8.4–10.5)
Chloride: 113 mEq/L — ABNORMAL HIGH (ref 96–112)
GFR, EST AFRICAN AMERICAN: 70 mL/min — AB (ref >90)
GFR, EST NON AFRICAN AMERICAN: 61 mL/min — AB (ref >90)
Glucose, Bld: 222 mg/dL — ABNORMAL HIGH (ref 70–99)
POTASSIUM: 3.7 meq/L (ref 3.7–5.3)
Sodium: 150 mEq/L — ABNORMAL HIGH (ref 137–147)

## 2013-03-18 LAB — GLUCOSE, CAPILLARY
GLUCOSE-CAPILLARY: 225 mg/dL — AB (ref 70–99)
Glucose-Capillary: 142 mg/dL — ABNORMAL HIGH (ref 70–99)
Glucose-Capillary: 112 mg/dL — ABNORMAL HIGH (ref 70–99)
Glucose-Capillary: 203 mg/dL — ABNORMAL HIGH (ref 70–99)

## 2013-03-18 LAB — SURGICAL PCR SCREEN
MRSA, PCR: NEGATIVE
Staphylococcus aureus: POSITIVE — AB

## 2013-03-18 SURGERY — CYSTOSCOPY, FLEXIBLE
Anesthesia: LOCAL

## 2013-03-18 MED ORDER — ATORVASTATIN CALCIUM 40 MG PO TABS: 40.0000 mg | ORAL_TABLET | Freq: Every day | ORAL | Status: DC

## 2013-03-18 MED ORDER — CHLORHEXIDINE GLUCONATE CLOTH 2 % EX PADS
6.0000 | MEDICATED_PAD | Freq: Every day | CUTANEOUS | Status: DC
  Administered 2013-03-18 – 2013-03-20 (×2): 6 via TOPICAL

## 2013-03-18 MED ORDER — GADOBENATE DIMEGLUMINE 529 MG/ML IV SOLN
15.0000 mL | Freq: Once | INTRAVENOUS | Status: AC | PRN
  Administered 2013-03-18: 15 mL via INTRAVENOUS

## 2013-03-18 MED ORDER — LIDOCAINE HCL 2 % EX GEL
CUTANEOUS | Status: DC | PRN
  Administered 2013-03-18: 1

## 2013-03-18 MED ORDER — SODIUM CHLORIDE 0.9 % IR SOLN
Status: DC | PRN
  Administered 2013-03-18: 3000 mL

## 2013-03-18 MED ORDER — DEXTROSE 5 % IV SOLN
INTRAVENOUS | Status: DC
  Administered 2013-03-18: 75 mL via INTRAVENOUS
  Administered 2013-03-19: 19:00:00 via INTRAVENOUS

## 2013-03-18 MED ORDER — STERILE WATER FOR IRRIGATION IR SOLN
Status: DC | PRN
  Administered 2013-03-18: 1000 mL

## 2013-03-18 MED ORDER — MUPIROCIN 2 % EX OINT
1.0000 "application " | TOPICAL_OINTMENT | Freq: Two times a day (BID) | CUTANEOUS | Status: DC
  Administered 2013-03-18 – 2013-03-20 (×6): 1 via NASAL
  Filled 2013-03-18: qty 22

## 2013-03-18 MED ORDER — METFORMIN HCL 500 MG PO TABS: 500.0000 mg | ORAL_TABLET | Freq: Two times a day (BID) | ORAL | Status: DC

## 2013-03-18 MED ORDER — VITAMIN D (ERGOCALCIFEROL) 1.25 MG (50000 UNIT) PO CAPS
50000.0000 [IU] | ORAL_CAPSULE | ORAL | Status: DC
  Administered 2013-03-19: 50000 [IU] via ORAL
  Filled 2013-03-18: qty 1

## 2013-03-18 MED ORDER — GLUCERNA SHAKE PO LIQD
237.0000 mL | Freq: Three times a day (TID) | ORAL | Status: DC
  Administered 2013-03-18 – 2013-03-20 (×6): 237 mL via ORAL

## 2013-03-18 MED ORDER — METOPROLOL SUCCINATE ER 50 MG PO TB24: 50.0000 mg | ORAL_TABLET | Freq: Every day | ORAL | Status: DC

## 2013-03-18 MED ORDER — LIDOCAINE HCL 2 % EX GEL
CUTANEOUS | Status: AC
  Filled 2013-03-18: qty 10

## 2013-03-18 SURGICAL SUPPLY — 19 items
CLOTH BEACON ORANGE TIMEOUT ST (SAFETY) ×2 IMPLANT
DRAPE LAPAROTOMY TRNSV 102X78 (DRAPE) ×2 IMPLANT
DRAPE PROXIMA HALF (DRAPES) ×2 IMPLANT
GLOVE BIO SURGEON STRL SZ7 (GLOVE) ×2 IMPLANT
GLOVE BIOGEL PI IND STRL 7.0 (GLOVE) IMPLANT
GLOVE BIOGEL PI INDICATOR 7.0 (GLOVE) ×1
GLOVE EXAM NITRILE MD LF STRL (GLOVE) ×1 IMPLANT
GLOVE SS BIOGEL STRL SZ 6.5 (GLOVE) IMPLANT
GLOVE SUPERSENSE BIOGEL SZ 6.5 (GLOVE) ×1
GOWN STRL REUS W/TWL LRG LVL3 (GOWN DISPOSABLE) ×4 IMPLANT
IV NS IRRIG 3000ML ARTHROMATIC (IV SOLUTION) ×2 IMPLANT
MARKER SKIN DUAL TIP RULER LAB (MISCELLANEOUS) ×2 IMPLANT
SET IRRIG Y TYPE TUR BLADDER L (SET/KITS/TRAYS/PACK) ×2 IMPLANT
SPONGE GAUZE 4X4 12PLY (GAUZE/BANDAGES/DRESSINGS) ×2 IMPLANT
TOWEL OR 17X26 4PK STRL BLUE (TOWEL DISPOSABLE) ×2 IMPLANT
TRAY FOLEY CATH 16FR SILVER (SET/KITS/TRAYS/PACK) ×2 IMPLANT
VALVE DISPOSABLE (MISCELLANEOUS) ×2 IMPLANT
WATER STERILE IRR 1000ML POUR (IV SOLUTION) ×2 IMPLANT
YANKAUER SUCT BULB TIP 10FT TU (MISCELLANEOUS) ×2 IMPLANT

## 2013-03-18 NOTE — Consult Note (Signed)
[name], [name] NO.:  [id number]  MEDICAL RECORD NO.:  [id number]  LOCATION:  R154                          FACILITY:  [facility]  PHYSICIAN:  Marissa Nestle, M.D.DATE OF BIRTH:  Feb 24, 1945  DATE OF CONSULTATION: DATE OF DISCHARGE:                                CONSULTATION   HISTORY OF PRESENT ILLNESS:  A 68 year old gentleman.  He denies any urinary complaints, but when the Foley catheter was inserted, he had about 2 L of urine in his bladder.  The patient is a very poor historian.  I reviewed his records.  Most of the history was obtained from his medical records.  He has multiple other medical problems, and the patient is primarily admitted with stroke, left CVA with weakness on the right side, and he cannot speak, but he answers all the questions very intelligently.  He has multiple other medical problem.  He has type 2 diabetes, hyperlipidemia, essential hypertension, benign atrial flutter, ischemic cardiomyopathy, aortic stenosis, low TSH level, poor social situation, cerebral hemorrhage, and noncompliance, so I have told them his physical examination pertaining to GU tract is essentially negative.  Has Foley catheter in, which is draining clear urine.  I reviewed his renal ultrasound, which shows that he has bilateral medical renal disease.  There is little hydronephrosis on the left side.  It could be just full renal pelvis.  They could not see any ureteral jet on either side.  He came to the emergency room with a chief complaint of weakness of his right side, inability to speak.  The patient was recently discharged from the hospital on March 3rd after he was treated for fecal impaction.  He has history of atrial flutter and was started on aspirin and no anticoagulation as per Cardiology due to history of noncompliance in the past.  He has non-insulin-dependent diabetes.  PAST SURGICAL HISTORY:  CABG in 2004 and after the CABG he  developed stroke.  PHYSICAL EXAMINATION:  GENERAL:  Moderately built male. VITAL SIGNS:  Blood pressure is 115/84, temperature 98.3. ABDOMEN:  Soft, flat.  Liver, spleen, kidneys not palpable.  He is sitting comfortably in the bed, answering questions, shaking his head no or yes. GU:  Foley catheter is in place.  He is circumcised.  Testicles are normal. RECTAL:  Deferred.  I will do at the time of cystoscopy.  LABORATORY DATA:  WBC count is 7.1, hematocrit is 43.9.  Sodium 139, potassium 3.9, chloride 102, CO2 is 23.  His BUN is 16, creatinine 1.10. Renal ultrasound was done today.  Left kidney shows no hydronephrosis, some fullness of the right pelvicaliceal system with mild hydronephrosis.  Catheter is in place, but the urinary bladder is somewhat distended.  Neither an ureteral jet is noted.  Bilateral renal medical disease.  Urine culture showed insignificant growth.  Urine culture was done on March 14th.  IMPRESSION:  Urinary retention, rule out possible neurogenic bladder.  RECOMMENDATIONS: Recommend cystoscopy under local anesthesia with flexible cystoscope tomorrow.  I have discussed with the patient, he is agreeable, so we will do that cysto tomorrow under local anesthesia.     Marissa Nestle, M.D.     MIJ/MEDQ  D:  03/17/2013  T:  03/18/2013  Job:  [id number]

## 2013-03-18 NOTE — Progress Notes (Signed)
Note: This document was prepared with digital dictation and possible smart phrase technology. Any transcriptional errors that result from this process are unintentional.   [name] [id number] DOB: 1945-03-14 DOA: 03/11/2013 PCP: Carolee Rota, NP  Brief narrative: 68 y/o ?, known admitted early am 03/11/13 c R sided weakness and inability to speak-patient recently been seen 03/03/13 because of paroxysmal atrial flutter [admitted for fecal impaction at that time 3/1-3/3] was discharged on aspirin alone secondary to noncompliance with medical therapies.   He carries a history of extensive CAD with CABG 2004, proximal RCA 2006 Ultimately found to have on CT of the head moderate to large left hemispheric infarct CT abdomen =  left upper lobe lung nodule [probably cancer], right adrenal mass [probably cancer ], increase in size of descending thoracic aneurysm to 5.7 cm from prior measurement 4.3 (2012)  Patient seen by cardiology who made recommendations for rate control    Past medical history-As per Problem list Chart reviewed as below-  Consultants:  Cardiology  Procedures:  None  Antibiotics:  None   Subjective  Patient talks slowly and haltingly but answers somewhat appropriately yes or no Cannot orient to time/place/person/year/season/president No spontaneous verbal complaints Nursing reports he fed himself today Has been out of bed today Just returned from cystoscopy    Objective    Interim History: None  Telemetry: Unremarkable   Objective: Filed Vitals:   03/18/13 1247 03/18/13 1250 03/18/13 1253 03/18/13 1312  BP:    128/89  Pulse:    73  Temp:    97.7 F (36.5 C)  TempSrc:      Resp: 24 24 24 20   Height:      Weight:      SpO2:    98%    Intake/Output Summary (Last 24 hours) at 03/18/13 1623 Last data filed at 03/18/13 1240  Gross per 24 hour  Intake   2585 ml  Output   3875 ml  Net  -1290 ml    Exam:  General: Frail disheveled  pleasant quiet Caucasian male Cardiovascular: S1-S2 no murmur rub gallop Respiratory: Clinically clear Abdomen: Soft nontender nondistended Skin no lower extremity edema Neuro range of motion intact, power 5/5, reflexes brisk biceps knee jerk brachioradialis no clonus Gait not assessed Vision by direct confrontation is normal  Data Reviewed: Basic Metabolic Panel:  Recent Labs Lab 03/13/13 0550 03/16/13 0507 03/17/13 0532 03/18/13 0541  NA 141 146 148* 150*  K 3.4* 3.4* 4.2 3.7  CL 104 108 113* 113*  CO2 21 22 21 23   GLUCOSE 150* 142* 160* 222*  BUN 12 37* 42* 34*  CREATININE 0.91 1.59* 1.60* 1.19  CALCIUM 9.2 9.0 9.2 9.2   Liver Function Tests: No results found for this basename: AST, ALT, ALKPHOS, BILITOT, PROT, ALBUMIN,  in the last 168 hours No results found for this basename: LIPASE, AMYLASE,  in the last 168 hours No results found for this basename: AMMONIA,  in the last 168 hours CBC:  Recent Labs Lab 03/17/13 0532  WBC 14.2*  HGB 17.0  HCT 52.3*  MCV 82.4  PLT 171   Cardiac Enzymes: No results found for this basename: CKTOTAL, CKMB, CKMBINDEX, TROPONINI,  in the last 168 hours BNP: No components found with this basename: POCBNP,  CBG:  Recent Labs Lab 03/17/13 1117 03/17/13 1635 03/17/13 2111 03/18/13 0746 03/18/13 1152  GLUCAP 184* 244* 234* 142* 112*    Recent Results (from the past 240 hour(s))  CULTURE, BLOOD (ROUTINE X 2)  Status: None   Collection Time    03/11/13  2:13 AM      Result Value Ref Range Status   Specimen Description BLOOD RIGHT ANTECUBITAL   Final   Special Requests     Final   Value: BOTTLES DRAWN AEROBIC AND ANAEROBIC AEB=4CC ANA=6CC   Culture NO GROWTH 5 DAYS   Final   Report Status 03/16/2013 FINAL   Final  CULTURE, BLOOD (ROUTINE X 2)     Status: None   Collection Time    03/11/13  2:13 AM      Result Value Ref Range Status   Specimen Description BLOOD RIGHT HAND   Final   Special Requests BOTTLES DRAWN  AEROBIC AND ANAEROBIC 5CC EACH   Final   Culture  Setup Time     Final   Value: 03/12/2013 14:12     Performed at [facility]   Culture     Final   Value: STAPHYLOCOCCUS SPECIES (COAGULASE NEGATIVE)     Note: THE SIGNIFICANCE OF ISOLATING THIS ORGANISM FROM A SINGLE SET OF BLOOD CULTURES WHEN MULTIPLE SETS ARE DRAWN IS UNCERTAIN. PLEASE NOTIFY THE MICROBIOLOGY DEPARTMENT WITHIN ONE WEEK IF SPECIATION AND SENSITIVITIES ARE REQUIRED.     Note: Gram Stain Report Called to,Read Back By and Verified With: MCDANIEL M AT 0040 ON [id number] BY FORSYTH K Performed at [facility]     Performed at [facility]   Report Status 03/13/2013 FINAL   Final  MRSA PCR SCREENING     Status: None   Collection Time    03/11/13  4:16 AM      Result Value Ref Range Status   MRSA by PCR NEGATIVE  NEGATIVE Final   Comment:            The GeneXpert MRSA Assay (FDA     approved for NASAL specimens     only), is one component of a     comprehensive MRSA colonization     surveillance program. It is not     intended to diagnose MRSA     infection nor to guide or     monitor treatment for     MRSA infections.  CULTURE, BLOOD (ROUTINE X 2)     Status: None   Collection Time    03/12/13  8:30 AM      Result Value Ref Range Status   Specimen Description BLOOD RIGHT HAND   Final   Special Requests BOTTLES DRAWN AEROBIC AND ANAEROBIC 4CC   Final   Culture NO GROWTH 5 DAYS   Final   Report Status 03/17/2013 FINAL   Final  CULTURE, BLOOD (ROUTINE X 2)     Status: None   Collection Time    03/12/13  8:30 AM      Result Value Ref Range Status   Specimen Description BLOOD RIGHT HAND   Final   Special Requests BOTTLES DRAWN AEROBIC AND ANAEROBIC 4CC   Final   Culture NO GROWTH 5 DAYS   Final   Report Status 03/17/2013 FINAL   Final  URINE CULTURE     Status: None   Collection Time    03/14/13  1:20 AM      Result Value Ref Range Status   Specimen Description URINE, CLEAN CATCH   Final    Special Requests NONE   Final   Culture  Setup Time     Final   Value: 03/14/2013 19:12     Performed at [facility]   Colony Count     Final   Value: 4,000 COLONIES/ML     Performed at [facility]     Final   Value: INSIGNIFICANT GROWTH     Performed at [facility]   Report Status 03/15/2013 FINAL   Final  SURGICAL PCR SCREEN     Status: Abnormal   Collection Time    03/17/13  9:32 PM      Result Value Ref Range Status   MRSA, PCR NEGATIVE  NEGATIVE Final   Staphylococcus aureus POSITIVE (*) NEGATIVE Final   Comment:            The Xpert SA Assay (FDA     approved for NASAL specimens     in patients over 67 years of age),     is one component of     a comprehensive surveillance     program.  Test performance has     been validated by [employer] for patients greater     than or equal to 82 year old.     It is not intended     to diagnose infection nor to     guide or monitor treatment.     RESULT CALLED TO, READ BACK BY AND VERIFIED WITH:     WILSON A AT 0104 ON [id number] BY FORSYTH K     Studies:              All Imaging reviewed and is as per above notation   Scheduled Meds: . aspirin  300 mg Rectal Daily   Or  . aspirin  325 mg Oral Daily  . atorvastatin  80 mg Oral q1800  . Chlorhexidine Gluconate Cloth  6 each Topical Daily  . diltiazem  180 mg Oral Daily  . feeding supplement (GLUCERNA SHAKE)  237 mL Oral TID BM  . heparin subcutaneous  5,000 Units Subcutaneous 3 times per day  . insulin aspart  0-9 Units Subcutaneous TID WC  . metFORMIN  500 mg Oral BID WC  . metoprolol succinate  125 mg Oral Daily  . mupirocin ointment  1 application Nasal BID  . nicotine  21 mg Transdermal Daily  . tamsulosin  0.4 mg Oral QPC supper  . [START ON 03/19/2013] Vitamin D (Ergocalciferol)  50,000 Units Oral Q7 days   Continuous Infusions: . dextrose 75 mL (03/18/13 0855)     Assessment/Plan:  1. Acute left basal ganglia  stroke-await MRI to rule out metastases from presumed lesion in right upper lung. This has been reordered 3 times. Patient diffusely has improved he is verbalizing however he is confused as to date time year and place. In my opinion he does not have capacity. Continue aspirin 325 mg, Lipitor 80 mg dose. appreciate neurology input 2. Rate controlled atrial fibrillation CHad2Vasc2 score= 4-5 point-not suitable candidate for systemic anticoagulation, as risk for falls, poor compliance. Continue aspirin full dose as above, in addition to Cardizem 180, metoprolol 125 3. LUL nodule with concern for Lung cancer in setting of Lung nodule 2012-Needs PET scan-await MRI/A brain 4. Diabetes mellitus-A1c 6.6. Blood sugar is 112-203.  Hold oral metformin 500 twice a day. 5. Desc. thoracic Aortic aneurysm-will need outpatient followup with vascular surgeon Dr.Brabham for this as well as some 5% stenosis right carotid artery 6. Bladder outlet obstruction-appreciate urology input, had Cystoscopy 3/18 with replacement of Foley catheter and noted mild BPH.  Continue Flomax  7. COPD-stable 8. Severe PEM-supplement 9. Hypernatremia-likely secondary to poor input of fluids and poor by mouth intake.  Started D5 saline 75 cc per hour. Repeat labs in a.m.  Code Status: Full Family Communication: Cobb,Eric Other [phone]-Called Neill Loft, Friend whio he lives with.  There is a sister who is not involved and is 52 and it is unclear if she is still alive Disposition Plan: D/c soon with plan to follow up with PCP   Verneita Griffes, MD  [facility] Pager [phone] 03/18/2013, 4:23 PM    LOS: 7 days

## 2013-03-18 NOTE — Progress Notes (Addendum)
NUTRITION FOLLOW UP  Intervention:    Increase Glucerna to TID between meals, each supplement provides 220 kcal and 10 grams protein  Nutrition Dx:   Inadequate oral intake related to decreased appetite as evidenced by meal completion 50%, ongoing  Goal:   Pt to meet >/= 90% of their estimated nutrition needs, progressing  Monitor:   PO intake and supplement acceptance, weight trends, labs, I/Os  Assessment:   68 year old male who has a past medical history of Coronary atherosclerosis of native coronary artery; Type 2 diabetes mellitus; Hyperlipidemia; Essential hypertension, benign; Atypical atrial flutter; Ischemic cardiomyopathy; Aortic stenosis; Low TSH level; Poor social situation; Cerebral hemorrhage; and Noncompliance. Presented to the ED with chief complaint of weakness of right side and inability to speak. In the ED, CT scan of the head was done which showed CVA  From previous nutritional management note on 3/13: Pt reports having a good appetite at home with 3 full meals a day. Currently pt reports having a decreased appetite, which has started a couple of days ago. Meal completion is 50%. Pt reports drinking glucerna at home and is willing to drink it while hospitalized-- will order. Pt reports no recent weight loss and is unsure of usual body weight. Per EPIC weight records, pt weight has increased since 3/02.  Attempted to meet with patient multiple times. Patient was out of room for cystoscopy flexible, rectal exam. Chart Reviewed. Patient continues to have poor intake with meal completion at 25-50%. Glucerna Shakes still appropriate and pt is accepting well. SLP met with patient today. Per SLP note, recommend Dysphagia 3 (mechanical soft) diet with thin liquids.  Sodium and Chloride elevated. BUN elevated at 34 mg/dL CBGs elevated at 244, 234, and 142 mg/dL  Height: Ht Readings from Last 1 Encounters:  03/11/13 $RemoveB'5\' 10"'uHQTJyNo$  (1.778 m)    Weight Status:   Wt Readings from Last  1 Encounters:  03/16/13 167 lb 8.8 oz (76 kg)    Re-estimated needs:  Kcal: 1900-2100 Protein: 95-105 grams Fluid: 1.9-2.1 L  Skin: Diabetic ulcer on left buttocks and coccyx; RLE and LLE non-pitting edema  Diet Order: Dysphagia 3 with thin liquids   Intake/Output Summary (Last 24 hours) at 03/18/13 0952 Last data filed at 03/18/13 0700  Gross per 24 hour  Intake   2705 ml  Output   6325 ml  Net  -3620 ml    Last BM: 3/16   Labs:   Recent Labs Lab 03/16/13 0507 03/17/13 0532 03/18/13 0541  NA 146 148* 150*  K 3.4* 4.2 3.7  CL 108 113* 113*  CO2 $Re'22 21 23  'KmC$ BUN 37* 42* 34*  CREATININE 1.59* 1.60* 1.19  CALCIUM 9.0 9.2 9.2  GLUCOSE 142* 160* 222*    CBG (last 3)   Recent Labs  03/17/13 1635 03/17/13 2111 03/18/13 0746  GLUCAP 244* 234* 142*    Scheduled Meds: . aspirin  300 mg Rectal Daily   Or  . aspirin  325 mg Oral Daily  . atorvastatin  80 mg Oral q1800  . Chlorhexidine Gluconate Cloth  6 each Topical Daily  . diltiazem  180 mg Oral Daily  . feeding supplement (GLUCERNA SHAKE)  237 mL Oral BID BM  . heparin subcutaneous  5,000 Units Subcutaneous 3 times per day  . insulin aspart  0-9 Units Subcutaneous TID WC  . metoprolol succinate  125 mg Oral Daily  . mupirocin ointment  1 application Nasal BID  . nicotine  21 mg Transdermal Daily  .  tamsulosin  0.4 mg Oral QPC supper    Continuous Infusions: . dextrose 75 mL (03/18/13 0855)    [name], Dietetic Intern Pager: [phone]   [name], RD, LDN Pager: [phone]

## 2013-03-18 NOTE — Progress Notes (Addendum)
Inpatient Diabetes Program Recommendations  AACE/ADA: New Consensus Statement on Inpatient Glycemic Control (2013)  Target Ranges:  Prepandial:   less than 140 mg/dL      Peak postprandial:   less than 180 mg/dL (1-2 hours)      Critically ill patients:  140 - 180 mg/dL   Results for BAZIL, DHANANI (MRN [id number]) as of 03/18/2013 09:52  Ref. Range 03/17/2013 07:21 03/17/2013 11:17 03/17/2013 16:35 03/17/2013 21:11 03/18/2013 07:46  Glucose-Capillary Latest Range: 70-99 mg/dL 175 (H) 184 (H) 244 (H) 234 (H) 142 (H)   Diabetes history: DM2 Outpatient Diabetes medications: Metformin 500 mg BID Current orders for Inpatient glycemic control: Novolog 0-9 units AC  Inpatient Diabetes Program Recommendations Correction (SSI): Please consider increasing Novolog correction scale to moderate scale.  Thanks, Barnie Alderman, RN, MSN, CCRN Diabetes Coordinator Inpatient Diabetes Program [phone] (Team Pager) [phone] ([facility] office) [phone] [facility] office)

## 2013-03-18 NOTE — Progress Notes (Signed)
Occupational Therapy Treatment Patient Details Name: [name] MRN: [id number] DOB: 1945/02/17 Today's Date: 03/18/2013 Time: 2993-7169 OT Time Calculation (min): 27 min  OT Assessment / Plan / Recommendation  History of present illness Pt again has increased communication and coordination this session.  When questioned about [employer] basketball and his preferred team, pt was able to respond with '[place].' Pt still has difficulty sequencing ADL tasks, but is improved from eval.   OT comments  Pt has improved cognition and verbal skills this session, and has overall improved coordination.  Pt remains with difficulty sequencing tasks in order to safely complete ADLs. Skilled OT services  will continue to work with pt on improved ADL status.                 Progress towards OT Goals Progress towards OT goals: Progressing toward goals  Plan Frequency remains appropriate;Other (comment) (pt would benefit from further skilled OT - venue to de determined based upon safety of home situation.)    Precautions / Restrictions Precautions Precautions: None Restrictions Weight Bearing Restrictions: No   Pertinent Vitals/Pain     ADL  Eating/Feeding: Supervision/safety Grooming: Wash/dry hands;Minimal assistance (Pt required verbal cues to use soap and ensure he had scrubbed everywhere.) Toilet Transfer: Modified independent Toilet Transfer Method: Sit to Loss adjuster, chartered: Bedside commode Toileting - Clothing Manipulation and Hygiene: Minimal assistance (Pt was able to wipe self using right hand, but required assist to ensure he was completely clean.) Where Assessed - Toileting Clothing Manipulation and Hygiene: Standing ADL Comments: Pt is demonstrating improved ADL skills, but remains with difficulty sequencing tasks and completing tasks to full extent needed. Pt requires verbal cues to initiate.  Pt has significanly improved coordination.  when pt was requested  to wipe himself for toilet hyginene, pt at first took cloth to his face - with one more verbal and gestural cue pt understood and wiped self, but not to full extend needed.  when requested to wash face earlier in the session, pt only wet hands and touched beard. Pt declined further ADL tasks.    OT Diagnosis:    OT Problem List:   OT Treatment Interventions:     OT Goals(current goals can now be found in the care plan section)    Visit Information  Last OT Received On: 03/18/13 History of Present Illness: Pt again has increased communication and coordination this session.  When questioned about [employer] and his preferred team, pt was able to respond with '[place].' Pt still has difficulty sequencing ADL tasks, but is improved from eval.    Subjective Data   "[place]" when asked about [facility] basketball team preference   Prior Functioning       Cognition  Cognition Arousal/Alertness: Awake/alert Behavior During Therapy: WFL for tasks assessed/performed Overall Cognitive Status: Impaired/Different from baseline Area of Impairment: Attention;Following commands;Problem solving Following Commands: Follows one step commands with increased time Problem Solving: Slow processing;Requires verbal cues;Difficulty sequencing             End of Session OT - End of Session Equipment Utilized During Treatment: Oxygen Activity Tolerance: Patient tolerated treatment well Patient left: in bed;with call bell/phone within reach;with bed alarm set  [place], [place], OTR/L [phone]  03/18/2013, 9:15 AM

## 2013-03-18 NOTE — Progress Notes (Signed)
Speech Language Pathology Treatment: Dysphagia;Cognitive-Linquistic  Patient Details Name: [name] MRN: [id number] DOB: 14-Dec-1945 Today's Date: 03/18/2013 Time: 0115-0140 SLP Time Calculation (min): 25 min  Assessment / Plan / Recommendation Clinical Impression  Pt was seen in room today to f/u for diet tolerance and address aphasia. Pt was able to consume mechanical soft textures with thin liquids given straw presentations, self-fed, without deficit. His communication was primarily through head nods and yes/no utterances. With mod to max cueing, there were little verbal responses at the word level other than yes/no. Pt able to answer simple and complex yes/no questions with 75% accuracy. It is difficult to distinguish whether pt's incorrect responses are language or memory related at times. SLP educated pt on compensatory strategies as well as general safety related to swallow function. Recommend continuation of POC with STx at next level of care as well.    HPI HPI: [name] to the hospital with a left basal ganglia infarct.  He has a hx of right brain stroke, CAD, DM, HTN.  He lives with a roommate and I have no information as to prior functional status.  At the time of admission he displayed right sided weakness, expressive aphasia and left gaze preference.    Pertinent Vitals   SLP Plan  Continue with current plan of care    Recommendations Diet recommendations: Dysphagia 3 (mechanical soft);Thin liquid Liquids provided via: Cup;Straw Medication Administration: Whole meds with puree Supervision: Patient able to self feed;Intermittent supervision to cue for compensatory strategies Compensations: Slow rate;Small sips/bites Postural Changes and/or Swallow Maneuvers: Seated upright 90 degrees;Upright 30-60 min after meal;Out of bed for meals              General recommendations: Rehab consult Oral Care Recommendations: Oral care BID Follow up Recommendations: Skilled  Nursing facility;Home health SLP;24 hour supervision/assistance Plan: Continue with current plan of care    GO     Patte Winkel S 03/18/2013, 1:42 PM

## 2013-03-18 NOTE — Progress Notes (Signed)
     Reviewed telemetry and he remains in atrial flutter rates controlled in the 60's. He should continue on current medical regimen, metoprolol 125 mg QD, Diltiazem 120mg  QD, and ASA.   1. Atrial flutter. Management strategy has been rate control, no anticoagulation with history of previous intracranial bleed, noncompliance, and falls.   2. Recent stroke, left hemispheric, evident by head CT with evidence of prior stroke as well.   3. Descending thoracic aortic aneurysm, 5.7 cm.   4. Multivessel CAD status post CABG in 2004 with DES the proximal RCA in 2006. LVEF 50-55% by recent echocardiogram.   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   6. Left apical lung nodule, described as irregular-shaped and enlarging by chest CT, concerning for malignancy.   7. Moderate aortic stenosis.  He is to continue to follow with Dr. Percival Spanish in [place], [place].  Satira Sark, M.D., F.A.C.C.

## 2013-03-18 NOTE — Brief Op Note (Signed)
03/11/2013 - 03/18/2013  12:50 PM  PATIENT:  Docia Furl  68 y.o. male  PRE-OPERATIVE DIAGNOSIS:  urinary retention  POST-OPERATIVE DIAGNOSIS:  neurogenic bladder, mild benign prostatic hypertrophy  PROCEDURE:  Procedure(s): CYSTOSCOPY FLEXIBLE, RECTAL EXAM (N/A)  SURGEON:  Surgeon(s) and Role:    * Marissa Nestle, MD - Primary  PHYSICIAN ASSISTANT:   ASSISTANTS: none   ANESTHESIA:   local  EBL:  Total I/O In: -  Out: 550 [Urine:550]  BLOOD ADMINISTERED:none  DRAINS: Urinary Catheter (Foley)   LOCAL MEDICATIONS USED:  NONE  SPECIMEN:  No Specimen  DISPOSITION OF SPECIMEN:  N/A  COUNTS:  YES  TOURNIQUET:  * No tourniquets in log *  DICTATION: .Other Dictation: Dictation Number dictation #[id number]  PLAN OF CARE: Admit to inpatient   PATIENT DISPOSITION:  PACU - hemodynamically stable.   Delay start of Pharmacological VTE agent (>24hrs) due to surgical blood loss or risk of bleeding:

## 2013-03-19 LAB — BASIC METABOLIC PANEL WITH GFR
BUN: 20 mg/dL (ref 6–23)
CO2: 26 meq/L (ref 19–32)
Calcium: 9 mg/dL (ref 8.4–10.5)
Chloride: 104 meq/L (ref 96–112)
Creatinine, Ser: 0.95 mg/dL (ref 0.50–1.35)
GFR calc Af Amer: >90 mL/min
GFR calc non Af Amer: 83 mL/min — ABNORMAL LOW
Glucose, Bld: 170 mg/dL — ABNORMAL HIGH (ref 70–99)
Potassium: 3.2 meq/L — ABNORMAL LOW (ref 3.7–5.3)
Sodium: 143 meq/L (ref 137–147)

## 2013-03-19 LAB — CBC
HCT: 49 % (ref 39.0–52.0)
HEMOGLOBIN: 15.9 g/dL (ref 13.0–17.0)
MCH: 27 pg (ref 26.0–34.0)
MCHC: 32.4 g/dL (ref 30.0–36.0)
MCV: 83.2 fL (ref 78.0–100.0)
Platelets: 187 10*3/uL (ref 150–400)
RBC: 5.89 MIL/uL — AB (ref 4.22–5.81)
RDW: 16.1 % — ABNORMAL HIGH (ref 11.5–15.5)
WBC: 10 10*3/uL (ref 4.0–10.5)

## 2013-03-19 LAB — GLUCOSE, CAPILLARY
GLUCOSE-CAPILLARY: 159 mg/dL — AB (ref 70–99)
GLUCOSE-CAPILLARY: 189 mg/dL — AB (ref 70–99)
GLUCOSE-CAPILLARY: 198 mg/dL — AB (ref 70–99)
Glucose-Capillary: 242 mg/dL — ABNORMAL HIGH (ref 70–99)

## 2013-03-19 MED ORDER — POTASSIUM CHLORIDE CRYS ER 20 MEQ PO TBCR
20.0000 meq | EXTENDED_RELEASE_TABLET | Freq: Two times a day (BID) | ORAL | Status: DC
  Administered 2013-03-19 – 2013-03-20 (×2): 20 meq via ORAL
  Filled 2013-03-19 (×2): qty 1

## 2013-03-19 NOTE — Op Note (Signed)
NAMERAMELO, OETKEN             ACCOUNT NO.:  [id number]  MEDICAL RECORD NO.:  [id number]  LOCATION:                                 FACILITY:  PHYSICIAN:  Marissa Nestle, M.D.DATE OF BIRTH:  Mar 06, 1945  DATE OF PROCEDURE:  03/18/2013 DATE OF DISCHARGE:                              OPERATIVE REPORT   PREOPERATIVE DIAGNOSIS:  Urinary retention.  POSTOPERATIVE DIAGNOSIS:  Mild benign prostatic hypertrophy and neurogenic bladder, with fecal impaction.  PROCEDURE:  Cystoscopy.  DESCRIPTION OF PROCEDURE:  The patient was placed in supine position. After usual prep and drape, flexible cystoscope was introduced after injecting Xylocaine jelly into the urethra, waiting adequate time. Anterior urethra looks normal.  Prostatic urethra is partially obstructed with lateral lobe hypertrophy.  Bladder is smooth.  No tumor, stone, foreign body seen in the bladder.  There was a catheter reaction in the bladder.  Bladder was filled up.  He has no sensation.  He probably has a neurogenic bladder and cystoscope was removed.  Foley catheter was reinserted.  I did a digital rectal exam at the end, looks like he has a fecal impaction.  His prostate is about 15-20 g, which is not very much enlarged and cystoscope was removed, and I reinserted the Foley catheter.  The patient left the operating room in satisfactory condition.     Marissa Nestle, M.D.     MIJ/MEDQ  D:  03/18/2013  T:  03/18/2013  Job:  [id number]

## 2013-03-19 NOTE — Progress Notes (Signed)
Comment:  Notified by Dr Jobe Igo with radiology service at [phone] regarding MRI brain results. Findings c/w acute/subacute (L) MCA infarct that is associated w/ areas of hemorrhage w/ in the insular cortex and operculum. There are no obvious findings to suggest metastasis. Spoke w/ RN caring for pt this evening who reports that pt is at baseline and nearing discharge. Discussed MRI findings w/ Dr Ernestina Patches at [facility] who recommended discussing MRI findings w/ neuro-hospitalists for recommended changes in current plan, if any. Consulted w/ Dr Nicole Kindred w/ the Neuro-hospitalists service who recommends no changes in current plan. Will continue to monitor closely.  Jeryl [place], NP-C [facility] Pager [phone]

## 2013-03-19 NOTE — Progress Notes (Signed)
Note: This document was prepared with digital dictation and possible smart phrase technology. Any transcriptional errors that result from this process are unintentional.   [name] [id number] DOB: 1945/08/11 DOA: 03/11/2013 PCP: Carolee Rota, NP  Brief narrative: 68 y/o ?, known admitted early am 03/11/13 c R sided weakness and inability to speak-patient recently been seen 03/03/13 because of paroxysmal atrial flutter [admitted for fecal impaction at that time 3/1-3/3] was discharged on aspirin alone secondary to noncompliance with medical therapies.   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 Excellent caregiver at bedside Eating and drinking fairly No chest pain no other issues    Objective    Interim History: None  Telemetry: Unremarkable   Objective: Filed Vitals:   03/18/13 1312 03/18/13 2156 03/19/13 0542 03/19/13 1549  BP: 128/89 156/92 155/93 122/75  Pulse: 73 86 84 67  Temp: 97.7 F (36.5 C) 98.2 F (36.8 C) 97.4 F (36.3 C) 97.6 F (36.4 C)  TempSrc:  Oral    Resp: 20 20 20 20   Height:      Weight:      SpO2: 98% 98% 97% 94%    Intake/Output Summary (Last 24 hours) at 03/19/13 1630 Last data filed at 03/19/13 0849  Gross per 24 hour  Intake    240 ml  Output   2000 ml  Net  -1760 ml    Exam:  General: Frail disheveled pleasant quiet Caucasian male Cardiovascular: S1-S2 no murmur rub  gallop Respiratory: Clinically clear Abdomen: Soft nontender nondistended Skin no lower extremity edema Neuro range of motion intact, power 5/5, reflexes brisk biceps knee jerk brachioradialis no clonus Gait not assessed Vision by direct confrontation is normal  Data Reviewed: Basic Metabolic Panel:  Recent Labs Lab 03/13/13 0550 03/16/13 0507 03/17/13 0532 03/18/13 0541 03/19/13 0530  NA 141 146 148* 150* 143  K 3.4* 3.4* 4.2 3.7 3.2*  CL 104 108 113* 113* 104  CO2 21 22 21 23 26   GLUCOSE 150* 142* 160* 222* 170*  BUN 12 37* 42* 34* 20  CREATININE 0.91 1.59* 1.60* 1.19 0.95  CALCIUM 9.2 9.0 9.2 9.2 9.0   Liver Function Tests: No results found for this basename: AST, ALT, ALKPHOS, BILITOT, PROT, ALBUMIN,  in the last 168 hours No results found for this basename: LIPASE, AMYLASE,  in the last 168 hours No results found for this basename: AMMONIA,  in the last 168 hours CBC:  Recent Labs Lab 03/17/13 0532 03/19/13 0530  WBC 14.2* 10.0  HGB 17.0 15.9  HCT 52.3* 49.0  MCV 82.4 83.2  PLT 171 187   Cardiac Enzymes: No results found for this basename: CKTOTAL, CKMB, CKMBINDEX, TROPONINI,  in the last 168 hours BNP: No components found with this basename: POCBNP,  CBG:  Recent Labs Lab 03/18/13 1152 03/18/13 1653 03/18/13 2159 03/19/13 0742 03/19/13 1145  GLUCAP 112* 203* 225* 159* 189*    Recent Results (from the past  240 hour(s))  CULTURE, BLOOD (ROUTINE X 2)     Status: None   Collection Time    03/11/13  2:13 AM      Result Value Ref Range Status   Specimen Description BLOOD RIGHT ANTECUBITAL   Final   Special Requests     Final   Value: BOTTLES DRAWN AEROBIC AND ANAEROBIC AEB=4CC ANA=6CC   Culture NO GROWTH 5 DAYS   Final   Report Status 03/16/2013 FINAL   Final  CULTURE, BLOOD (ROUTINE X 2)     Status: None   Collection Time    03/11/13  2:13 AM      Result Value Ref Range Status   Specimen Description BLOOD RIGHT HAND   Final   Special Requests  BOTTLES DRAWN AEROBIC AND ANAEROBIC 5CC EACH   Final   Culture  Setup Time     Final   Value: 03/12/2013 14:12     Performed at [facility]   Culture     Final   Value: STAPHYLOCOCCUS SPECIES (COAGULASE NEGATIVE)     Note: THE SIGNIFICANCE OF ISOLATING THIS ORGANISM FROM A SINGLE SET OF BLOOD CULTURES WHEN MULTIPLE SETS ARE DRAWN IS UNCERTAIN. PLEASE NOTIFY THE MICROBIOLOGY DEPARTMENT WITHIN ONE WEEK IF SPECIATION AND SENSITIVITIES ARE REQUIRED.     Note: Gram Stain Report Called to,Read Back By and Verified With: MCDANIEL M AT 0040 ON [id number] BY FORSYTH K Performed at [facility]     Performed at [facility]   Report Status 03/13/2013 FINAL   Final  MRSA PCR SCREENING     Status: None   Collection Time    03/11/13  4:16 AM      Result Value Ref Range Status   MRSA by PCR NEGATIVE  NEGATIVE Final   Comment:            The GeneXpert MRSA Assay (FDA     approved for NASAL specimens     only), is one component of a     comprehensive MRSA colonization     surveillance program. It is not     intended to diagnose MRSA     infection nor to guide or     monitor treatment for     MRSA infections.  CULTURE, BLOOD (ROUTINE X 2)     Status: None   Collection Time    03/12/13  8:30 AM      Result Value Ref Range Status   Specimen Description BLOOD RIGHT HAND   Final   Special Requests BOTTLES DRAWN AEROBIC AND ANAEROBIC 4CC   Final   Culture NO GROWTH 5 DAYS   Final   Report Status 03/17/2013 FINAL   Final  CULTURE, BLOOD (ROUTINE X 2)     Status: None   Collection Time    03/12/13  8:30 AM      Result Value Ref Range Status   Specimen Description BLOOD RIGHT HAND   Final   Special Requests BOTTLES DRAWN AEROBIC AND ANAEROBIC 4CC   Final   Culture NO GROWTH 5 DAYS   Final   Report Status 03/17/2013 FINAL   Final  URINE CULTURE     Status: None   Collection Time    03/14/13  1:20 AM      Result Value Ref Range Status   Specimen Description URINE, CLEAN CATCH    Final   Special Requests NONE   Final   Culture  Setup Time     Final  Value: 03/14/2013 19:12     Performed at [place]     Final   Value: 4,000 COLONIES/ML     Performed at [facility]   Culture     Final   Value: INSIGNIFICANT GROWTH     Performed at [facility]   Report Status 03/15/2013 FINAL   Final  SURGICAL PCR SCREEN     Status: Abnormal   Collection Time    03/17/13  9:32 PM      Result Value Ref Range Status   MRSA, PCR NEGATIVE  NEGATIVE Final   Staphylococcus aureus POSITIVE (*) NEGATIVE Final   Comment:            The Xpert SA Assay (FDA     approved for NASAL specimens     in patients over 44 years of age),     is one component of     a comprehensive surveillance     program.  Test performance has     been validated by [employer] for patients greater     than or equal to 44 year old.     It is not intended     to diagnose infection nor to     guide or monitor treatment.     RESULT CALLED TO, READ BACK BY AND VERIFIED WITH:     WILSON A AT 0104 ON [id number] BY FORSYTH K     Studies:              All Imaging reviewed and is as per above notation   Scheduled Meds: . aspirin  300 mg Rectal Daily   Or  . aspirin  325 mg Oral Daily  . atorvastatin  80 mg Oral q1800  . Chlorhexidine Gluconate Cloth  6 each Topical Daily  . diltiazem  180 mg Oral Daily  . feeding supplement (GLUCERNA SHAKE)  237 mL Oral TID BM  . heparin subcutaneous  5,000 Units Subcutaneous 3 times per day  . insulin aspart  0-9 Units Subcutaneous TID WC  . metoprolol succinate  125 mg Oral Daily  . mupirocin ointment  1 application Nasal BID  . nicotine  21 mg Transdermal Daily  . tamsulosin  0.4 mg Oral QPC supper  . Vitamin D (Ergocalciferol)  50,000 Units Oral Q7 days   Continuous Infusions: . dextrose 75 mL (03/18/13 0855)     Assessment/Plan:  1. Acute left basal ganglia strok with question o days of hemorrhage in insular  cortex and operculum f-Continue aspirin 325 mg, Lipitor 80 mg dose. No acute roll to discontinue antiplatelet agent. 2. Rate controlled atrial fibrillation CHad2Vasc2 score= 4-5 point-not suitable candidate for systemic anticoagulation, as risk for falls, poor compliance and prior head.. Continue aspirin full dose as above, in addition to Cardizem 180, metoprolol 125 3. LUL nodule with concern for Lung cancer in setting of Lung nodule 2012-discussed with oncology PA 3/19, who has kindly scheduled him for outpatient appointment to discuss potential treatment of the same 4. Diabetes mellitus-A1c 6.6. Blood sugar is 112-203.  Hold oral metformin 500 twice a day. 5. Desc. thoracic Aortic aneurysm-will need outpatient followup with vascular surgeon Dr.Brabham for this as well as some 5% stenosis right carotid artery 6. Bladder outlet obstruction-appreciate urology input, had Cystoscopy 3/18 with replacement of Foley catheter and noted mild BPH.  Continue Flomax-deferred to urology regarding removal of catheter. Potentially as an outpatient 7. COPD-stable 8. Severe  PEM-supplement 9. Hypernatremia-likely secondary to poor input of fluids and poor by mouth intake.  Started D5 saline 75 cc per hour. Repeat labs in a.m.  Code Status: Full Family Communication: Long discussion with surrogate decision maker at bedside-requests full workup and all the possible modalities of treatment Disposition Plan: Likely discharge in a.m. if all stable   Verneita Griffes, MD  [facility] Pager [phone] 03/19/2013, 4:30 PM    LOS: 8 days

## 2013-03-20 ENCOUNTER — Encounter (HOSPITAL_COMMUNITY): Payer: Self-pay | Admitting: Urology

## 2013-03-20 LAB — CBC
HEMATOCRIT: 43.8 % (ref 39.0–52.0)
Hemoglobin: 14.6 g/dL (ref 13.0–17.0)
MCH: 26.7 pg (ref 26.0–34.0)
MCHC: 33.3 g/dL (ref 30.0–36.0)
MCV: 80.2 fL (ref 78.0–100.0)
PLATELETS: 183 10*3/uL (ref 150–400)
RBC: 5.46 MIL/uL (ref 4.22–5.81)
RDW: 15.7 % — AB (ref 11.5–15.5)
WBC: 9.9 10*3/uL (ref 4.0–10.5)

## 2013-03-20 LAB — BASIC METABOLIC PANEL
BUN: 20 mg/dL (ref 6–23)
CALCIUM: 8.3 mg/dL — AB (ref 8.4–10.5)
CO2: 25 mEq/L (ref 19–32)
CREATININE: 0.95 mg/dL (ref 0.50–1.35)
Chloride: 98 mEq/L (ref 96–112)
GFR calc non Af Amer: 83 mL/min — ABNORMAL LOW (ref >90)
Glucose, Bld: 170 mg/dL — ABNORMAL HIGH (ref 70–99)
Potassium: 3.2 mEq/L — ABNORMAL LOW (ref 3.7–5.3)
Sodium: 135 mEq/L — ABNORMAL LOW (ref 137–147)

## 2013-03-20 LAB — GLUCOSE, CAPILLARY
Glucose-Capillary: 168 mg/dL — ABNORMAL HIGH (ref 70–99)
Glucose-Capillary: 209 mg/dL — ABNORMAL HIGH (ref 70–99)

## 2013-03-20 MED ORDER — ASPIRIN 325 MG PO TABS: 325.0000 mg | ORAL_TABLET | Freq: Every day | ORAL | Status: AC

## 2013-03-20 MED ORDER — NICOTINE 21 MG/24HR TD PT24: 21.0000 mg | MEDICATED_PATCH | Freq: Every day | TRANSDERMAL | Status: DC

## 2013-03-20 MED ORDER — POTASSIUM CHLORIDE CRYS ER 20 MEQ PO TBCR: 20.0000 meq | EXTENDED_RELEASE_TABLET | Freq: Two times a day (BID) | ORAL | Status: DC

## 2013-03-20 MED ORDER — DILTIAZEM HCL ER COATED BEADS 180 MG PO CP24: 180.0000 mg | ORAL_CAPSULE | Freq: Every day | ORAL | Status: DC

## 2013-03-20 MED ORDER — METOPROLOL SUCCINATE ER 25 MG PO TB24: 125.0000 mg | ORAL_TABLET | Freq: Every day | ORAL | Status: DC

## 2013-03-20 MED ORDER — TAMSULOSIN HCL 0.4 MG PO CAPS: 0.4000 mg | ORAL_CAPSULE | Freq: Every day | ORAL | Status: AC

## 2013-03-20 NOTE — Progress Notes (Signed)
Discharge instructions and prescriptions given to caregiver who verbalized understanding, instructed on how to empty foley catheter and care of catheter, verbalized understanding, out in stable condition via w/c with staff.

## 2013-03-20 NOTE — Discharge Summary (Signed)
Physician Discharge Summary  [name] [id number] DOB: 1945/02/09 DOA: 03/11/2013  PCP: Carolee Rota, NP  Admit date: 03/11/2013 Discharge date: 03/20/2013  Time spent: 40 minutes  Recommendations for Outpatient Follow-up:  1. Continue aspirin 325 mg doses 2. Please note metoprolol as well as Cardizem dose had not titrated given he had rapid ventricular response 3. Recommend outpatient goals of care discussion after oncology sees him if he is not for any specific type of chemotherapy or radiation therapy 4. Please arrange followup with Dr. Michela Pitcher of urology with regards to outlet obstruction-Foley is necessary at this stage 5. Home health RN will be ordered to assist with the patient's care as well as an 8 6. Would not aggressively control his diabetes mellitus  Discharge Diagnoses:  Principal Problem:   Stroke Active Problems:   Hypertension   Atypical atrial flutter   Diabetes mellitus   CVA (cerebral infarction)   Solitary pulmonary nodule   Thoracic aneurysm without mention of rupture   Adrenal mass, right   Lung mass   Bladder dysfunction   Discharge Condition: Guarded  Diet recommendation: Regular  Filed Weights   03/15/13 0500 03/16/13 0500 03/16/13 1107  Weight: 77 kg (169 lb 12.1 oz) 75.9 kg (167 lb 5.3 oz) 76 kg (167 lb 8.8 oz)    History of present illness:  68 y/o ?, known admitted early am 03/11/13 c R sided weakness and inability to speak-patient recently been seen 03/03/13 because of paroxysmal atrial flutter [admitted for fecal impaction at that time 3/1-3/3] was discharged on aspirin alone secondary to noncompliance with medical therapies.  He carries a history of extensive CAD with CABG 2004, proximal RCA 2006  Ultimately found to have on CT of the head moderate to large left hemispheric infarct  CT abdomen = left upper lobe lung nodule [probably cancer], right adrenal mass [probably cancer ], increase in size of descending thoracic aneurysm to 5.7  cm from prior measurement 4.3 (2012)  Patient seen by cardiology who made recommendations for rate control   Hospital Course:   1. Acute left basal ganglia strok with question o days of hemorrhage in insular cortex and operculum -Continue aspirin 325 mg, Lipitor 80 mg dose. No acute roll to discontinue antiplatelet agent-the risks benefits and alternatives have been discussed with patient who has poor understanding of what is going on as well as surrogate health care decision maker 2. Rate controlled atrial fibrillation CHad2Vasc2 score= 4-5 point-not suitable candidate for systemic anticoagulation, as risk for falls, poor compliance and prior head. Continue aspirin full dose as above, in addition to Cardizem 180, metoprolol 125 3. LUL nodule with concern for Lung cancer in setting of Lung nodule 2012-discussed with oncology PA 3/19, who has kindly scheduled him for outpatient appointment to discuss potential treatment of the same--please see above appointment date 4. Diabetes mellitus-A1c 6.6. Blood sugar is 112-203. May continue metformin on discharge however would not aggressively controlled and poor prognosis overall 5. Desc. thoracic Aortic aneurysm-will need outpatient followup with vascular surgeon Dr.Brabham for this as well as some 5% stenosis right carotid artery-this will need to be arranged by primary care practitioner 6. Bladder outlet obstruction-appreciate urology input, had Cystoscopy 3/18 with replacement of Foley catheter and noted mild BPH. Continue Flomax 0.4 mg-deferred to urology regarding removal of catheter in about 2-3 weeks at urologist's office 7. COPD-stable 8. Severe PEM-supplement 9. Hypernatremia-likely secondary to poor input of fluids and poor by mouth intake. Started D5 saline 75 cc per hour 03/18/2013. His  last sodium on 3/20 was 135. He will need to be encouraged to drink fluids   Consultants:  Cardiology Procedures:  None Antibiotics:  None  Discharge  Exam: Filed Vitals:   03/20/13 0425  BP: 119/76  Pulse: 78  Temp: 97.5 F (36.4 C)  Resp: 20   Pleasant unclear if he completely understands  General: EOMI, NCAT Cardiovascular: S1-S2 no murmur rub or gallop Respiratory: Clinically clear  Discharge Instructions  Discharge Orders   Future Appointments Provider Department Dept Phone   03/26/2013 2:30 PM Ap-Acapa Covering Provider [place] [phone]   04/15/2013 3:30 PM Minus Breeding, MD [place] [phone]   04/20/2013 10:00 AM Serafina Mitchell, MD [facility] [phone]   Future Orders Complete By Expires   Diet - low sodium heart healthy  As directed    Discharge instructions  As directed    Comments:     Medications were changed-please look at the dosages carefully He will need a catheter in place until he can see urologist Dr. Michela Pitcher in the outpatient setting in about 3 weeks. By that time hopefully the catheter will not be needed after he is seen  He'll need close followup at [place] 03/26/13 to determine if there is any role for chemotherapy or radiation therapy for his lungs  He should keep his scheduled appointment with cardiology He will need labs in about a week at his primary care physician's office He is to stop smoking completely   Increase activity slowly  As directed        Medication List    STOP taking these medications       aspirin 81 MG EC tablet  Replaced by:  aspirin 325 MG tablet      TAKE these medications       ALPRAZolam 1 MG tablet  Commonly known as:  XANAX  Take 1 mg by mouth 4 (four) times daily.     aspirin 325 MG tablet  Take 1 tablet (325 mg total) by mouth daily.     atorvastatin 40 MG tablet  Commonly known as:  LIPITOR  Take 40 mg by mouth daily.     diltiazem 180 MG 24 hr capsule  Commonly known as:  CARDIZEM CD  Take 1 capsule (180 mg total) by mouth daily.     metFORMIN 500 MG tablet  Commonly known  as:  GLUCOPHAGE  Take 500 mg by mouth 2 (two) times daily with a meal.     metoprolol succinate 25 MG 24 hr tablet  Commonly known as:  TOPROL-XL  Take 5 tablets (125 mg total) by mouth daily.     nicotine 21 mg/24hr patch  Commonly known as:  NICODERM CQ - dosed in mg/24 hours  Place 1 patch (21 mg total) onto the skin daily.     potassium chloride SA 20 MEQ tablet  Commonly known as:  K-DUR,KLOR-CON  Take 1 tablet (20 mEq total) by mouth 2 (two) times daily.     rosuvastatin 40 MG tablet  Commonly known as:  CRESTOR  Take 40 mg by mouth daily.     tamsulosin 0.4 MG Caps capsule  Commonly known as:  FLOMAX  Take 1 capsule (0.4 mg total) by mouth daily after supper.     Vitamin D (Ergocalciferol) 50000 UNITS Caps capsule  Commonly known as:  DRISDOL  Take 50,000 Units by mouth every 7 (seven) days.       Allergies  Allergen  Reactions  . Benadryl [Diphenhydramine Hcl] Other (See Comments)    Hot Flashes "Like Niaspan"       Follow-up Information   Follow up with Minus Breeding, MD On 04/15/2013. (3:30 pm-See in [place] office)    Specialty:  Cardiology   Contact information:   [id number] [place] [address] [phone]       Follow up with [place]. [facility] RN, will call to schedule appointment)    Contact information:   [address] [place] [address] [phone]      Follow up with [facility] On 03/26/2013. (MD initial visit at 2:30.  Arrive at 2 PM)    Contact information:   [address] [place] [address]        The results of significant diagnostics from this hospitalization (including imaging, microbiology, ancillary and laboratory) are listed below for reference.    Significant Diagnostic Studies: Dg Chest 1 View  03/11/2013   CLINICAL DATA Right extremity weakness.  EXAM CHEST - 1 VIEW  COMPARISON November 04, 2012.  FINDINGS Stable cardiomegaly. Status post coronary artery bypass  graft. Old right rib fractures are noted. No pneumothorax or pleural effusion is noted. No acute pulmonary disease is noted.  IMPRESSION No acute cardiopulmonary abnormality seen.  SIGNATURE  Electronically Signed   By: Sabino Dick M.D.   On: 03/11/2013 02:44   Dg Abd 1 View  03/02/2013   CLINICAL DATA:  Constipation  EXAM: ABDOMEN - 1 VIEW  COMPARISON:  DG ABDOMEN 1V dated 03/01/2013; CT CHEST W/CM dated 08/25/2010  FINDINGS: Previous large bone of stool in the rectal month has cleared. Gas is present in the proximal colon without colonic distention noted. No small bowel dilatation is currently evident.  Vascular calcifications appear chronic.  IMPRESSION: 1. Interval clearance of the prominent stool in the rectal vault. 2. Atherosclerosis.   Electronically Signed   By: Sherryl Barters M.D.   On: 03/02/2013 07:39   Dg Abd 1 View  03/01/2013   CLINICAL DATA:  Constipation, abdominal pain and cramping.  EXAM: ABDOMEN - 1 VIEW  COMPARISON:  Lumbar spine radiographs performed 11/16/2010  FINDINGS: The visualized bowel gas pattern is unremarkable. Scattered air and stool filled loops of colon are seen; no abnormal dilatation of small bowel loops is seen to suggest small bowel obstruction. No free intra-abdominal air is identified, though evaluation for free air is limited on a single supine view.  The rectum is distended with stool, measuring 10.2 cm in transverse dimension, raising concern for fecal impaction.  The visualized osseous structures are within normal limits; the sacroiliac joints are unremarkable in appearance. Scattered vascular calcifications are seen.  IMPRESSION: 1. Rectum distended with stool to 10.2 cm in transverse dimension, raising concern for fecal impaction. 2. Relatively small amount of stool noted in the remainder of the colon. Unremarkable bowel gas pattern; no free intra-abdominal air seen.   Electronically Signed   By: Garald Balding M.D.   On: 03/01/2013 05:24   Ct Head Wo  Contrast  03/11/2013   CLINICAL DATA Right arm weakness, expressive aphasia  EXAM CT HEAD WITHOUT CONTRAST  TECHNIQUE Contiguous axial images were obtained from the base of the skull through the vertex without intravenous contrast.  COMPARISON 06/02/2012  FINDINGS Subtle left basal ganglia hypodensity and loss of left insular ribbon series 2, image 18. Right coronal radiata hypodensity is similar and may reflect sequelae of a prior lacunar infarction. Multiple other  scattered areas of subcortical and periventricular white matter hypodensity may reflect chronic microangiopathic change. No intraparenchymal hemorrhage, mass, mass effect, or abnormal extra-axial fluid collection. No hydrocephalus. Atherosclerotic vascular calcifications. Paranasal sinuses and mastoid air cells are predominantly clear.  IMPRESSION Left basal ganglia hypodensity and loss of left insular ribbon may reflect an acute infarction.  Right corona radiata remote lacunar infarction and multifocal white matter hypodensities, similar to prior.  Critical Value/emergent results were called by telephone at the time of interpretation on 03/11/2013 at 1:59 AM to Dr. Jola Schmidt , who verbally acknowledged these results.  SIGNATURE  Electronically Signed   By: Carlos Levering M.D.   On: 03/11/2013 02:02   Ct Angio Neck W/cm &/or Wo/cm  03/12/2013   CLINICAL DATA:  Abnormal carotid Doppler study with extensive irregular atherosclerotic plaque bilaterally but no definite hemodynamically significant stenosis. Stroke.  EXAM: CT ANGIOGRAPHY NECK  TECHNIQUE: Multidetector CT imaging of the neck was performed using the standard protocol during bolus administration of intravenous contrast. Multiplanar CT image reconstructions and MIPs were obtained to evaluate the vascular anatomy. Carotid stenosis measurements (when applicable) are obtained utilizing NASCET criteria, using the distal internal carotid diameter as the denominator.  CONTRAST:  18mL OMNIPAQUE  IOHEXOL 350 MG/ML SOLN  COMPARISON:  US CAROTID DUPLEX BILAT dated 03/11/2013; CT HEAD W/O CM dated 03/11/2013; CT C SPINE W/O CM dated 06/02/2012; CT CHEST W/CM dated 08/25/2010  FINDINGS: There is a common origin of the left common carotid artery and the innominate artery. Dense calcific plaque is present in the proximal left subclavian artery, narrowing the lumen to 2.9 mm. The more distal left subclavian artery measures 8.8 mm.  The vertebral arteries originate from the subclavian arteries bilaterally. Calcifications are present adjacent to the vertebral artery ostia without significant stenosis. There are scattered calcifications throughout cervical vertebral arteries. The lumen is narrowed to 1.3 mm on the right at C5. This compares with the more normal distal vessel measuring 3.4 mm at the level of C2. Calcifications are present in the left vertebral artery without significant canal compromise.  Calcifications are present along the right common carotid artery. There is a dense calcification of the level of C5 which narrows the lumen the 3.1 mm. This compares with the more distal common carotid measurement of 7.6 mm proximal to the bifurcation.  A high-grade calcific stenosis is present at the carotid bifurcation. The lumen is narrowed to 1.5 mm and this compares with more distal luminal measurements of 6.1 mm.  Atherosclerotic calcifications are noted along the left common carotid artery without a significant stenosis. There are additional calcifications at the left carotid bifurcation without a significant stenosis relative to the distal vessel.  Atherosclerotic calcifications are present within the cavernous carotid arteries bilaterally without definite stenosis. Limited imaging of the brain is unremarkable.  The soft tissues of the neck are otherwise unremarkable. A nodular density in the medial left upper lobe demonstrates interval increase in size since 2012. The lesion now measures 25 x 22 x 20 mm. No other  focal nodule, mass, or airspace disease is present. There is no significant adenopathy.  Review of the MIP images confirms the above findings.  IMPRESSION: 1. Extensive calcified atherosclerotic changes throughout the neck. 2. 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 3. 60% stenosis of the right vertebral artery at the level of C5. Scattered calcifications are present throughout the vertebral arteries without other significant stenoses. 4. 60% stenosis of the mid right  common carotid artery at the level of C5. 5. 75% stenosis of the proximal right internal carotid artery with dense atherosclerotic calcifications. 6. Enlarging 2.5 cm left upper lobe lung nodule. Recommend CT the chest with contrast for further evaluation. PET scanning may be useful as well depending on findings from the remainder of the chest. These results will be called to the ordering clinician or representative by the Radiologist Assistant, and communication documented in the PACS Dashboard.   Electronically Signed   By: Lawrence Santiago M.D.   On: 03/12/2013 10:39   Ct Chest W Contrast  03/13/2013   CLINICAL DATA:  Left upper lobe nodule identified on recent neck CT.  EXAM: CT CHEST WITH CONTRAST  TECHNIQUE: Multidetector CT imaging of the chest was performed during intravenous contrast administration.  CONTRAST:  67mL OMNIPAQUE IOHEXOL 300 MG/ML  SOLN  COMPARISON:  CT of the neck 03/12/2013.  FINDINGS: Mediastinum: Heart size is mildly enlarged. There is no significant pericardial fluid, thickening or pericardial calcification. There is atherosclerosis of the thoracic aorta, the great vessels of the mediastinum and the coronary arteries, including calcified atherosclerotic plaque in the left main, left anterior descending, left circumflex and right coronary arteries. Status post median sternotomy for CABG, including LIMA to the LAD. In addition, there is aneurysmal dilatation of the  descending thoracic aorta which measures up to 5.7 cm in diameter (significantly increased compared to prior study 08/25/2010, at which point it measured only 4.3 cm in diameter). No pathologically enlarged mediastinal or hilar lymph nodes. Esophagus is unremarkable in appearance.  Lungs/Pleura: In the medial aspect of the apex of the left upper lobe there is an enlarging irregular-shaped masslike opacity that measures up to 4.2 x 2.2 cm (images 7 and 8 of series 3). There is some surrounding architectural distortion and this lesion makes contact with the overlying pleura both medially and anteriorly, where there is some mild pleural retraction. Probable scarring throughout the left lung base. Moderate diffuse bronchial wall thickening. Mild centrilobular emphysema. Small left pleural effusion layering dependently.  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 Diffuse adreniform thickening of the left adrenal gland is similar to the prior examination, although there is a new nodular component of this laterally which measures approximately 1.5 x 2.2 cm (image 55 of series 2), which is indeterminate.  Musculoskeletal: Old T12 compression fracture with approximately 40% loss of anterior vertebral body height appear slightly increased compared to remote prior chest radiograph 09/13/2006. In addition, there is a new compression fracture of T8 with approximately 25% loss of anterior vertebral body height, which does not appear to be acute. There are no aggressive appearing lytic or blastic lesions noted in the visualized portions of the skeleton. Multiple old healed right-sided rib fractures. Median sternotomy wires.  IMPRESSION: 1. Interval growth of an irregular-shaped mass like opacity in the medial aspect of the left apex, which currently measures up to 4.2 x 2.2 cm and is highly concerning for a slow-growing neoplasm such as an  adenocarcinoma. Correlation with PET-CT and/or biopsy is strongly recommended to exclude neoplasm at this time. 2. Fatty adreniform enlargement of the left adrenal gland is similar to the prior study, although there is a new soft tissue nodule extending off the lateral aspect of the gland which measures 2.2 x 1.5 cm. This is indeterminate on today's examination, and the possibility of a metastatic lesion or primary adrenal lesion warrants consideration. This could be definitively  characterized with MRI of the abdomen or noncontrast CT of the abdomen if clinically indicated. 3. Atherosclerosis, including left main and 3 vessel coronary artery disease. In addition, there is increasing aneurysmal dilatation of the descending thoracic aorta and visualized portions of the abdominal aorta, as above. In particular, the descending thoracic aorta has increased from 4.3 cm in diameter on the prior examination from 08/25/2010 to 5.7 cm in diameter on today's examination. Patient is status post median sternotomy for CABG. 4. Diffuse bronchial wall thickening with mild centrilobular emphysema ; imaging findings suggestive of underlying COPD. 5. Additional incidental findings, as above. These results will be called to the ordering clinician or representative by the Radiologist Assistant, and communication documented in the PACS Dashboard.   Electronically Signed   By: Vinnie Langton M.D.   On: 03/13/2013 10:11   Mr Jeri Cos NG Contrast  03/18/2013   CLINICAL DATA:  NEW DIAGNOSIS OF LUNG CANCER. Rule out metastases. Left basal ganglia infarct.  EXAM: MRI HEAD WITHOUT AND WITH CONTRAST  TECHNIQUE: Multiplanar, multiecho pulse sequences of the brain and surrounding structures were obtained without and with intravenous contrast.  CONTRAST:  51mL MULTIHANCE GADOBENATE DIMEGLUMINE 529 MG/ML IV SOLN  COMPARISON:  CT ANGIO NECK W/CM &/OR WO/CM dated 03/12/2013; CT HEAD W/O CM dated 03/11/2013  FINDINGS: A prominent acute/ subacute left  MCA territory infarct involves the left insular cortex and operculum. There is some involvement of the caudate nucleus. The posterior lentiform nucleus is affected. Areas of hemorrhage are noted within the insular cortex and operculum. More remote areas of nonhemorrhagic infarct are present within the left caudate head. There are remote lacunar infarcts in the right coronal radiata. Least 1 focal area of remote blood products is present in the anterior right corona read out as well. A remote infarct is evident within left paramedian pons.  Remote lacunar infarcts are present in the basal ganglia by scratch the remote lacunar infarcts are present cerebellum bilaterally.  Postcontrast images demonstrate enhancement throughout areas of subacute infarction compatible with luxury perfusion. No enhancing mass lesion is evident to suggest metastasis. Although a small enhancing metastasis could certainly be lost amongst this cortical enhancement. The correspondence to diffusion abnormality in and timing is all compatible with a subacute infarct.  IMPRESSION: 1. Acute/subacute left MCA territory infarct involves the left insular cortex, operculum, posterior left lentiform nucleus and posterior caudate nucleus. Additional cortical areas over the left frontal lobe are involved. 2. Enhancement within the infarct territories is compatible with luxury perfusion. 3. Areas of hemorrhage are noted within the left insular cortex and operculum. 4. Remote lacunar infarcts involve the right coronal radiata and bilateral cerebellum. Critical Value/emergent results were called by telephone at the time of interpretation on 03/18/2013 at 7:29 PM to K. Schorr, who verbally acknowledged these results.   Electronically Signed   By: Lawrence Santiago M.D.   On: 03/18/2013 19:29   US Renal  03/17/2013   CLINICAL DATA:  Renal failure  EXAM: RENAL/URINARY TRACT ULTRASOUND COMPLETE  COMPARISON:  None.  FINDINGS: Right Kidney:  Length: 11.4 cm. There  is some fullness of the right pelvocaliceal system and mild hydronephrosis is a consideration.  Left Kidney:  Length: 9.2 cm. No hydronephrosis is seen. The left kidney is more difficult to visualize due to bowel gas and both kidneys may be slightly echogenic. Correlation with renal laboratory values is recommended to assess for chronic renal medical disease.  Bladder:  A urinary bladder Foley catheter is present but the  urinary bladder is somewhat distended. Neither ureteral jet is noted.  IMPRESSION: 1. Slight fullness of the right pelvocaliceal system. Cannot exclude mild right hydronephrosis. 2. Somewhat echogenic renal parenchyma left more so than right may indicate chronic renal medical disease. Correlate with renal laboratory values. 3. Foley catheter within urinary bladder. No definite ureteral jet is seen.   Electronically Signed   By: Ivar Drape M.D.   On: 03/17/2013 15:06   US Carotid Duplex Bilateral  03/11/2013   CLINICAL DATA CVA, hypertension  EXAM BILATERAL CAROTID DUPLEX ULTRASOUND  TECHNIQUE Gray scale imaging, color Doppler and duplex ultrasound were performed of bilateral carotid and vertebral arteries in the neck.  COMPARISON CT HEAD W/O CM dated 03/11/2013  FINDINGS Criteria: Quantification of carotid stenosis is based on velocity parameters that correlate the residual internal carotid diameter with NASCET-based stenosis levels, using the diameter of the distal internal carotid lumen as the denominator for stenosis measurement.  The following velocity measurements were obtained:  RIGHT  ICA:  109/33 cm/sec  CCA:  78/93 cm/sec  SYSTOLIC ICA/CCA RATIO:  2.0  DIASTOLIC ICA/CCA RATIO:  3.4  ECA:  72 cm/sec  LEFT  ICA:  59/14 cm/sec  CCA:  81/01 cm/sec  SYSTOLIC ICA/CCA RATIO:  1.1  DIASTOLIC ICA/CCA RATIO:  1.1  ECA:  112 cm/sec  RIGHT CAROTID ARTERY: There is rather large amount of the eccentric echogenic partially shadowing plaque within the mid aspect of the right common carotid artery  (representative image 7) extending to involve the distal aspects of the right common carotid artery (image 11). There is eccentric echogenic partially shadowing plaque within the right carotid bulb (images 14 and 16) extending to involve the origin and proximal aspect of the right internal carotid artery (image 24), not definitely resulting in elevated peak systolic velocities within the interrogated course of the right internal carotid artery to suggest a hemodynamically significant stenosis.  RIGHT VERTEBRAL ARTERY:  Antegrade flow  LEFT CAROTID ARTERY: There is eccentric echogenic partially shadowing plaque within the mid and distal aspects of the left common carotid artery (representative images 42, 45 and 46). There is eccentric mixed echogenic partially shadowing plaque within the left carotid bulb (images 50 and 52), extending to involve the origin and proximal aspect of the left internal carotid artery (image 61), not resulting in elevated peak systolic velocities within the interrogated course of the left internal carotid artery to suggest a hemodynamically significant stenosis.  LEFT VERTEBRAL ARTERY:  Antegrade flow  IMPRESSION Rather extensive amount of irregular atherosclerotic plaque bilaterally, not definitely resulting in hemodynamically significant stenosis though could serve as a source of distal embolism. Further evaluation could be performed with CTA as clinically indicated.  SIGNATURE  Electronically Signed   By: Sandi Mariscal M.D.   On: 03/11/2013 16:55   Dg Chest Port 1 View  03/16/2013   CLINICAL DATA:  Shortness of breath.  EXAM: PORTABLE CHEST - 1 VIEW  COMPARISON:  CT CHEST W/CM dated 03/13/2013; DG CHEST 1 VIEW dated 03/11/2013  FINDINGS: Sequelae of prior CABG are again identified. Enlargement of the cardiac silhouette is unchanged. Thoracic aortic calcification is noted. Irregular opacity in the left lung apex is better evaluated on recent CT. There is slightly increased linear opacity  in the left lung base compared to the prior radiograph, and there is also mildly increased opacity in the right lung base. No large pleural effusion or pneumothorax is identified. No acute osseous abnormality is identified.  IMPRESSION: Increased linear opacity in the  left lung base, likely atelectasis. Slightly increased right basilar opacity may also represent atelectasis although developing infiltrate is not excluded.   Electronically Signed   By: Logan Bores   On: 03/16/2013 08:54    Microbiology: Recent Results (from the past 240 hour(s))  CULTURE, BLOOD (ROUTINE X 2)     Status: None   Collection Time    03/11/13  2:13 AM      Result Value Ref Range Status   Specimen Description BLOOD RIGHT ANTECUBITAL   Final   Special Requests     Final   Value: BOTTLES DRAWN AEROBIC AND ANAEROBIC AEB=4CC ANA=6CC   Culture NO GROWTH 5 DAYS   Final   Report Status 03/16/2013 FINAL   Final  CULTURE, BLOOD (ROUTINE X 2)     Status: None   Collection Time    03/11/13  2:13 AM      Result Value Ref Range Status   Specimen Description BLOOD RIGHT HAND   Final   Special Requests BOTTLES DRAWN AEROBIC AND ANAEROBIC 5CC EACH   Final   Culture  Setup Time     Final   Value: 03/12/2013 14:12     Performed at [facility]   Culture     Final   Value: STAPHYLOCOCCUS SPECIES (COAGULASE NEGATIVE)     Note: THE SIGNIFICANCE OF ISOLATING THIS ORGANISM FROM A SINGLE SET OF BLOOD CULTURES WHEN MULTIPLE SETS ARE DRAWN IS UNCERTAIN. PLEASE NOTIFY THE MICROBIOLOGY DEPARTMENT WITHIN ONE WEEK IF SPECIATION AND SENSITIVITIES ARE REQUIRED.     Note: Gram Stain Report Called to,Read Back By and Verified With: MCDANIEL M AT 0040 ON [id number] BY FORSYTH K Performed at [facility]     Performed at [facility]   Report Status 03/13/2013 FINAL   Final  MRSA PCR SCREENING     Status: None   Collection Time    03/11/13  4:16 AM      Result Value Ref Range Status   MRSA by PCR NEGATIVE  NEGATIVE Final    Comment:            The GeneXpert MRSA Assay (FDA     approved for NASAL specimens     only), is one component of a     comprehensive MRSA colonization     surveillance program. It is not     intended to diagnose MRSA     infection nor to guide or     monitor treatment for     MRSA infections.  CULTURE, BLOOD (ROUTINE X 2)     Status: None   Collection Time    03/12/13  8:30 AM      Result Value Ref Range Status   Specimen Description BLOOD RIGHT HAND   Final   Special Requests BOTTLES DRAWN AEROBIC AND ANAEROBIC 4CC   Final   Culture NO GROWTH 5 DAYS   Final   Report Status 03/17/2013 FINAL   Final  CULTURE, BLOOD (ROUTINE X 2)     Status: None   Collection Time    03/12/13  8:30 AM      Result Value Ref Range Status   Specimen Description BLOOD RIGHT HAND   Final   Special Requests BOTTLES DRAWN AEROBIC AND ANAEROBIC 4CC   Final   Culture NO GROWTH 5 DAYS   Final   Report Status 03/17/2013 FINAL   Final  URINE CULTURE     Status: None   Collection Time  03/14/13  1:20 AM      Result Value Ref Range Status   Specimen Description URINE, CLEAN CATCH   Final   Special Requests NONE   Final   Culture  Setup Time     Final   Value: 03/14/2013 19:12     Performed at [facility]     Final   Value: 4,000 COLONIES/ML     Performed at [facility]   Culture     Final   Value: INSIGNIFICANT GROWTH     Performed at [facility]   Report Status 03/15/2013 FINAL   Final  SURGICAL PCR SCREEN     Status: Abnormal   Collection Time    03/17/13  9:32 PM      Result Value Ref Range Status   MRSA, PCR NEGATIVE  NEGATIVE Final   Staphylococcus aureus POSITIVE (*) NEGATIVE Final   Comment:            The Xpert SA Assay (FDA     approved for NASAL specimens     in patients over 60 years of age),     is one component of     a comprehensive surveillance     program.  Test performance has     been validated by [employer] for patients  greater     than or equal to 18 year old.     It is not intended     to diagnose infection nor to     guide or monitor treatment.     RESULT CALLED TO, READ BACK BY AND VERIFIED WITH:     WILSON A AT 0104 ON [id number] BY FORSYTH K     Labs: Basic Metabolic Panel:  Recent Labs Lab 03/16/13 0507 03/17/13 0532 03/18/13 0541 03/19/13 0530 03/20/13 0540  NA 146 148* 150* 143 135*  K 3.4* 4.2 3.7 3.2* 3.2*  CL 108 113* 113* 104 98  CO2 22 21 23 26 25   GLUCOSE 142* 160* 222* 170* 170*  BUN 37* 42* 34* 20 20  CREATININE 1.59* 1.60* 1.19 0.95 0.95  CALCIUM 9.0 9.2 9.2 9.0 8.3*   Liver Function Tests: No results found for this basename: AST, ALT, ALKPHOS, BILITOT, PROT, ALBUMIN,  in the last 168 hours No results found for this basename: LIPASE, AMYLASE,  in the last 168 hours No results found for this basename: AMMONIA,  in the last 168 hours CBC:  Recent Labs Lab 03/17/13 0532 03/19/13 0530 03/20/13 0540  WBC 14.2* 10.0 9.9  HGB 17.0 15.9 14.6  HCT 52.3* 49.0 43.8  MCV 82.4 83.2 80.2  PLT 171 187 183   Cardiac Enzymes: No results found for this basename: CKTOTAL, CKMB, CKMBINDEX, TROPONINI,  in the last 168 hours BNP: BNP (last 3 results)  Recent Labs  06/05/12 0459 11/01/12 0056 11/04/12 2131  PROBNP 4068.0* 7093.0* 6884.0*   CBG:  Recent Labs Lab 03/19/13 0742 03/19/13 1145 03/19/13 1607 03/19/13 2125 03/20/13 0721  GLUCAP 159* 189* 198* 242* 168*       Signed:  Nita Sells  [facility] 03/20/2013, 10:26 AM

## 2013-03-26 ENCOUNTER — Telehealth (HOSPITAL_COMMUNITY): Payer: Self-pay | Admitting: Hematology and Oncology

## 2013-03-26 ENCOUNTER — Ambulatory Visit (HOSPITAL_COMMUNITY): Payer: PRIVATE HEALTH INSURANCE

## 2013-03-26 DIAGNOSIS — N400 Enlarged prostate without lower urinary tract symptoms: Secondary | ICD-10-CM | POA: Insufficient documentation

## 2013-03-26 DIAGNOSIS — J449 Chronic obstructive pulmonary disease, unspecified: Secondary | ICD-10-CM | POA: Insufficient documentation

## 2013-03-27 NOTE — Progress Notes (Signed)
This encounter was created in error - please disregard.

## 2013-03-30 ENCOUNTER — Encounter: Payer: Self-pay | Admitting: *Deleted

## 2013-03-30 ENCOUNTER — Encounter (HOSPITAL_COMMUNITY): Payer: Self-pay

## 2013-04-03 ENCOUNTER — Inpatient Hospital Stay (HOSPITAL_COMMUNITY)
Admission: EM | Admit: 2013-04-03 | Discharge: 2013-04-09 | DRG: 309 | Disposition: A | Discharge status: Skilled Nursing Facility | Payer: PRIVATE HEALTH INSURANCE | Attending: Internal Medicine | Admitting: Internal Medicine

## 2013-04-03 ENCOUNTER — Emergency Department (HOSPITAL_COMMUNITY): Payer: PRIVATE HEALTH INSURANCE

## 2013-04-03 ENCOUNTER — Other Ambulatory Visit: Payer: Self-pay

## 2013-04-03 ENCOUNTER — Encounter (HOSPITAL_COMMUNITY): Payer: Self-pay | Admitting: Emergency Medicine

## 2013-04-03 DIAGNOSIS — I714 Abdominal aortic aneurysm, without rupture, unspecified: Secondary | ICD-10-CM | POA: Diagnosis present

## 2013-04-03 DIAGNOSIS — I4891 Unspecified atrial fibrillation: Principal | ICD-10-CM | POA: Diagnosis present

## 2013-04-03 DIAGNOSIS — N390 Urinary tract infection, site not specified: Secondary | ICD-10-CM | POA: Diagnosis not present

## 2013-04-03 DIAGNOSIS — J449 Chronic obstructive pulmonary disease, unspecified: Secondary | ICD-10-CM

## 2013-04-03 DIAGNOSIS — R918 Other nonspecific abnormal finding of lung field: Secondary | ICD-10-CM

## 2013-04-03 DIAGNOSIS — Z66 Do not resuscitate: Secondary | ICD-10-CM | POA: Diagnosis present

## 2013-04-03 DIAGNOSIS — Z9861 Coronary angioplasty status: Secondary | ICD-10-CM

## 2013-04-03 DIAGNOSIS — R911 Solitary pulmonary nodule: Secondary | ICD-10-CM

## 2013-04-03 DIAGNOSIS — K5900 Constipation, unspecified: Secondary | ICD-10-CM

## 2013-04-03 DIAGNOSIS — J811 Chronic pulmonary edema: Secondary | ICD-10-CM

## 2013-04-03 DIAGNOSIS — N320 Bladder-neck obstruction: Secondary | ICD-10-CM | POA: Diagnosis present

## 2013-04-03 DIAGNOSIS — Z8249 Family history of ischemic heart disease and other diseases of the circulatory system: Secondary | ICD-10-CM

## 2013-04-03 DIAGNOSIS — K5641 Fecal impaction: Secondary | ICD-10-CM

## 2013-04-03 DIAGNOSIS — I712 Thoracic aortic aneurysm, without rupture, unspecified: Secondary | ICD-10-CM | POA: Diagnosis present

## 2013-04-03 DIAGNOSIS — I5031 Acute diastolic (congestive) heart failure: Secondary | ICD-10-CM

## 2013-04-03 DIAGNOSIS — C61 Malignant neoplasm of prostate: Secondary | ICD-10-CM | POA: Diagnosis present

## 2013-04-03 DIAGNOSIS — Z9181 History of falling: Secondary | ICD-10-CM

## 2013-04-03 DIAGNOSIS — I2589 Other forms of chronic ischemic heart disease: Secondary | ICD-10-CM | POA: Diagnosis present

## 2013-04-03 DIAGNOSIS — I69920 Aphasia following unspecified cerebrovascular disease: Secondary | ICD-10-CM

## 2013-04-03 DIAGNOSIS — Z79899 Other long term (current) drug therapy: Secondary | ICD-10-CM

## 2013-04-03 DIAGNOSIS — R0689 Other abnormalities of breathing: Secondary | ICD-10-CM

## 2013-04-03 DIAGNOSIS — F17200 Nicotine dependence, unspecified, uncomplicated: Secondary | ICD-10-CM | POA: Diagnosis present

## 2013-04-03 DIAGNOSIS — E119 Type 2 diabetes mellitus without complications: Secondary | ICD-10-CM | POA: Diagnosis present

## 2013-04-03 DIAGNOSIS — I6350 Cerebral infarction due to unspecified occlusion or stenosis of unspecified cerebral artery: Secondary | ICD-10-CM

## 2013-04-03 DIAGNOSIS — Z91199 Patient's noncompliance with other medical treatment and regimen due to unspecified reason: Secondary | ICD-10-CM

## 2013-04-03 DIAGNOSIS — Y846 Urinary catheterization as the cause of abnormal reaction of the patient, or of later complication, without mention of misadventure at the time of the procedure: Secondary | ICD-10-CM | POA: Diagnosis not present

## 2013-04-03 DIAGNOSIS — I509 Heart failure, unspecified: Secondary | ICD-10-CM | POA: Diagnosis present

## 2013-04-03 DIAGNOSIS — E440 Moderate protein-calorie malnutrition: Secondary | ICD-10-CM | POA: Diagnosis present

## 2013-04-03 DIAGNOSIS — T83511A Infection and inflammatory reaction due to indwelling urethral catheter, initial encounter: Secondary | ICD-10-CM | POA: Diagnosis not present

## 2013-04-03 DIAGNOSIS — I4892 Unspecified atrial flutter: Secondary | ICD-10-CM | POA: Diagnosis present

## 2013-04-03 DIAGNOSIS — N319 Neuromuscular dysfunction of bladder, unspecified: Secondary | ICD-10-CM

## 2013-04-03 DIAGNOSIS — I10 Essential (primary) hypertension: Secondary | ICD-10-CM | POA: Diagnosis present

## 2013-04-03 DIAGNOSIS — E278 Other specified disorders of adrenal gland: Secondary | ICD-10-CM

## 2013-04-03 DIAGNOSIS — I69320 Aphasia following cerebral infarction: Secondary | ICD-10-CM

## 2013-04-03 DIAGNOSIS — I2510 Atherosclerotic heart disease of native coronary artery without angina pectoris: Secondary | ICD-10-CM | POA: Diagnosis present

## 2013-04-03 DIAGNOSIS — Z681 Body mass index (BMI) 19 or less, adult: Secondary | ICD-10-CM

## 2013-04-03 DIAGNOSIS — I484 Atypical atrial flutter: Secondary | ICD-10-CM

## 2013-04-03 DIAGNOSIS — Z7982 Long term (current) use of aspirin: Secondary | ICD-10-CM

## 2013-04-03 DIAGNOSIS — I5032 Chronic diastolic (congestive) heart failure: Secondary | ICD-10-CM | POA: Diagnosis present

## 2013-04-03 DIAGNOSIS — E785 Hyperlipidemia, unspecified: Secondary | ICD-10-CM | POA: Diagnosis present

## 2013-04-03 DIAGNOSIS — E279 Disorder of adrenal gland, unspecified: Secondary | ICD-10-CM | POA: Diagnosis present

## 2013-04-03 DIAGNOSIS — I350 Nonrheumatic aortic (valve) stenosis: Secondary | ICD-10-CM

## 2013-04-03 DIAGNOSIS — I69998 Other sequelae following unspecified cerebrovascular disease: Secondary | ICD-10-CM

## 2013-04-03 DIAGNOSIS — J441 Chronic obstructive pulmonary disease with (acute) exacerbation: Secondary | ICD-10-CM

## 2013-04-03 DIAGNOSIS — N400 Enlarged prostate without lower urinary tract symptoms: Secondary | ICD-10-CM | POA: Diagnosis present

## 2013-04-03 DIAGNOSIS — Z720 Tobacco use: Secondary | ICD-10-CM

## 2013-04-03 DIAGNOSIS — I639 Cerebral infarction, unspecified: Secondary | ICD-10-CM | POA: Diagnosis present

## 2013-04-03 DIAGNOSIS — J9601 Acute respiratory failure with hypoxia: Secondary | ICD-10-CM

## 2013-04-03 DIAGNOSIS — I739 Peripheral vascular disease, unspecified: Secondary | ICD-10-CM

## 2013-04-03 DIAGNOSIS — Z951 Presence of aortocoronary bypass graft: Secondary | ICD-10-CM

## 2013-04-03 DIAGNOSIS — Z9119 Patient's noncompliance with other medical treatment and regimen: Secondary | ICD-10-CM

## 2013-04-03 HISTORY — DX: Cerebral infarction, unspecified: I63.9

## 2013-04-03 LAB — CBC WITH DIFFERENTIAL/PLATELET
Basophils Absolute: 0 10*3/uL (ref 0.0–0.1)
Basophils Relative: 0 % (ref 0–1)
EOS ABS: 0.3 10*3/uL (ref 0.0–0.7)
Eosinophils Relative: 4 % (ref 0–5)
HCT: 44.7 % (ref 39.0–52.0)
Hemoglobin: 15 g/dL (ref 13.0–17.0)
LYMPHS ABS: 1.6 10*3/uL (ref 0.7–4.0)
LYMPHS PCT: 19 % (ref 12–46)
MCH: 27.3 pg (ref 26.0–34.0)
MCHC: 33.6 g/dL (ref 30.0–36.0)
MCV: 81.3 fL (ref 78.0–100.0)
Monocytes Absolute: 1 10*3/uL (ref 0.1–1.0)
Monocytes Relative: 11 % (ref 3–12)
NEUTROS PCT: 66 % (ref 43–77)
Neutro Abs: 5.8 10*3/uL (ref 1.7–7.7)
PLATELETS: 256 10*3/uL (ref 150–400)
RBC: 5.5 MIL/uL (ref 4.22–5.81)
RDW: 15.8 % — ABNORMAL HIGH (ref 11.5–15.5)
WBC: 8.8 10*3/uL (ref 4.0–10.5)

## 2013-04-03 LAB — BASIC METABOLIC PANEL
BUN: 17 mg/dL (ref 6–23)
CO2: 23 meq/L (ref 19–32)
Calcium: 9.7 mg/dL (ref 8.4–10.5)
Chloride: 97 mEq/L (ref 96–112)
Creatinine, Ser: 1.02 mg/dL (ref 0.50–1.35)
GFR calc Af Amer: 85 mL/min — ABNORMAL LOW (ref >90)
GFR, EST NON AFRICAN AMERICAN: 73 mL/min — AB (ref >90)
GLUCOSE: 142 mg/dL — AB (ref 70–99)
Potassium: 4.4 mEq/L (ref 3.7–5.3)
SODIUM: 135 meq/L — AB (ref 137–147)

## 2013-04-03 LAB — MAGNESIUM: MAGNESIUM: 2.1 mg/dL (ref 1.5–2.5)

## 2013-04-03 LAB — GLUCOSE, CAPILLARY: Glucose-Capillary: 169 mg/dL — ABNORMAL HIGH (ref 70–99)

## 2013-04-03 LAB — URINALYSIS, ROUTINE W REFLEX MICROSCOPIC
BILIRUBIN URINE: NEGATIVE
Glucose, UA: NEGATIVE mg/dL
Ketones, ur: NEGATIVE mg/dL
Nitrite: POSITIVE — AB
Protein, ur: NEGATIVE mg/dL
SPECIFIC GRAVITY, URINE: 1.025 (ref 1.005–1.030)
UROBILINOGEN UA: 1 mg/dL (ref 0.0–1.0)
pH: 5.5 (ref 5.0–8.0)

## 2013-04-03 LAB — URINE MICROSCOPIC-ADD ON

## 2013-04-03 LAB — TROPONIN I: Troponin I: <0.3 ng/mL (ref <0.30)

## 2013-04-03 MED ORDER — POTASSIUM CHLORIDE CRYS ER 20 MEQ PO TBCR
20.0000 meq | EXTENDED_RELEASE_TABLET | Freq: Two times a day (BID) | ORAL | Status: DC
  Administered 2013-04-03 – 2013-04-09 (×12): 20 meq via ORAL
  Filled 2013-04-03 (×12): qty 1

## 2013-04-03 MED ORDER — ONDANSETRON HCL 4 MG/2ML IJ SOLN: 4.0000 mg | Freq: Four times a day (QID) | INTRAMUSCULAR | Status: DC | PRN

## 2013-04-03 MED ORDER — METOPROLOL SUCCINATE ER 50 MG PO TB24
50.0000 mg | ORAL_TABLET | Freq: Every day | ORAL | Status: DC
  Administered 2013-04-03 – 2013-04-04 (×2): 50 mg via ORAL
  Filled 2013-04-03 (×2): qty 1

## 2013-04-03 MED ORDER — DILTIAZEM HCL 100 MG IV SOLR: 5.0000 mg/h | INTRAVENOUS | Status: DC

## 2013-04-03 MED ORDER — TAMSULOSIN HCL 0.4 MG PO CAPS
0.4000 mg | ORAL_CAPSULE | Freq: Every day | ORAL | Status: DC
  Administered 2013-04-03 – 2013-04-08 (×6): 0.4 mg via ORAL
  Filled 2013-04-03 (×6): qty 1

## 2013-04-03 MED ORDER — DILTIAZEM LOAD VIA INFUSION
10.0000 mg | Freq: Once | INTRAVENOUS | Status: AC
  Administered 2013-04-03: 10 mg via INTRAVENOUS

## 2013-04-03 MED ORDER — DILTIAZEM HCL 100 MG IV SOLR
5.0000 mg/h | Freq: Once | INTRAVENOUS | Status: AC
  Administered 2013-04-03 – 2013-04-04 (×2): 5 mg/h via INTRAVENOUS

## 2013-04-03 MED ORDER — SODIUM CHLORIDE 0.9 % IJ SOLN
3.0000 mL | Freq: Two times a day (BID) | INTRAMUSCULAR | Status: DC
  Administered 2013-04-03 – 2013-04-08 (×9): 3 mL via INTRAVENOUS

## 2013-04-03 MED ORDER — INSULIN ASPART 100 UNIT/ML ~~LOC~~ SOLN
0.0000 [IU] | Freq: Three times a day (TID) | SUBCUTANEOUS | Status: DC
  Administered 2013-04-04: 3 [IU] via SUBCUTANEOUS
  Administered 2013-04-05 – 2013-04-06 (×3): 2 [IU] via SUBCUTANEOUS
  Administered 2013-04-06 – 2013-04-07 (×2): 3 [IU] via SUBCUTANEOUS
  Administered 2013-04-08 (×2): 2 [IU] via SUBCUTANEOUS
  Administered 2013-04-09: 3 [IU] via SUBCUTANEOUS

## 2013-04-03 MED ORDER — ACETAMINOPHEN 325 MG PO TABS: 650.0000 mg | ORAL_TABLET | Freq: Four times a day (QID) | ORAL | Status: DC | PRN

## 2013-04-03 MED ORDER — SODIUM CHLORIDE 0.9 % IV SOLN
INTRAVENOUS | Status: DC
  Administered 2013-04-03: 15:00:00 via INTRAVENOUS

## 2013-04-03 MED ORDER — VITAMIN D (ERGOCALCIFEROL) 1.25 MG (50000 UNIT) PO CAPS
50000.0000 [IU] | ORAL_CAPSULE | ORAL | Status: DC
  Administered 2013-04-06: 50000 [IU] via ORAL
  Filled 2013-04-03: qty 1

## 2013-04-03 MED ORDER — SODIUM CHLORIDE 0.9 % IV SOLN
INTRAVENOUS | Status: DC
  Administered 2013-04-04 (×2): via INTRAVENOUS

## 2013-04-03 MED ORDER — SODIUM CHLORIDE 0.9 % IV SOLN: INTRAVENOUS | Status: DC

## 2013-04-03 MED ORDER — ACETAMINOPHEN 650 MG RE SUPP: 650.0000 mg | Freq: Four times a day (QID) | RECTAL | Status: DC | PRN

## 2013-04-03 MED ORDER — ONDANSETRON HCL 4 MG PO TABS: 4.0000 mg | ORAL_TABLET | Freq: Four times a day (QID) | ORAL | Status: DC | PRN

## 2013-04-03 MED ORDER — ENOXAPARIN SODIUM 40 MG/0.4ML ~~LOC~~ SOLN
40.0000 mg | SUBCUTANEOUS | Status: DC
  Administered 2013-04-03: 40 mg via SUBCUTANEOUS
  Filled 2013-04-03: qty 0.4

## 2013-04-03 MED ORDER — ONDANSETRON HCL 4 MG PO TABS: 4.0000 mg | ORAL_TABLET | Freq: Three times a day (TID) | ORAL | Status: DC | PRN

## 2013-04-03 MED ORDER — BIOTENE DRY MOUTH MT LIQD
15.0000 mL | Freq: Two times a day (BID) | OROMUCOSAL | Status: DC
  Administered 2013-04-03 – 2013-04-05 (×4): 15 mL via OROMUCOSAL

## 2013-04-03 MED ORDER — NITROGLYCERIN 0.4 MG SL SUBL: 0.4000 mg | SUBLINGUAL_TABLET | SUBLINGUAL | Status: DC | PRN

## 2013-04-03 MED ORDER — ATORVASTATIN CALCIUM 40 MG PO TABS
80.0000 mg | ORAL_TABLET | Freq: Every day | ORAL | Status: DC
  Administered 2013-04-04 – 2013-04-08 (×5): 80 mg via ORAL
  Filled 2013-04-03 (×5): qty 2

## 2013-04-03 MED ORDER — ALPRAZOLAM 0.5 MG PO TABS
1.0000 mg | ORAL_TABLET | Freq: Four times a day (QID) | ORAL | Status: DC
  Administered 2013-04-03 – 2013-04-04 (×2): 1 mg via ORAL
  Filled 2013-04-03 (×2): qty 2

## 2013-04-03 MED ORDER — ASPIRIN 325 MG PO TABS
325.0000 mg | ORAL_TABLET | Freq: Every day | ORAL | Status: DC
  Administered 2013-04-04 – 2013-04-09 (×6): 325 mg via ORAL
  Filled 2013-04-03 (×7): qty 1

## 2013-04-03 MED ORDER — GLUCERNA 1.2 CAL PO LIQD
237.0000 mL | Freq: Three times a day (TID) | ORAL | Status: DC
  Administered 2013-04-03: 237 mL via ORAL
  Filled 2013-04-03 (×9): qty 237

## 2013-04-03 NOTE — Progress Notes (Signed)
Spoke with Dr. Cydney Ok about patients home medications. Informed him that per caretaker patient has not been taking metoprolol but has been taking lasix. MD stated to continue metoprolol as ordered and not to add lasix at this time.

## 2013-04-03 NOTE — H&P (Signed)
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 Patient denied any symptoms. In the ED he was found to be in rapid A. fib with heart rate up to 170s. Remaining vitals were stable. Because of the face the patient is not able to provide further details. Patient was started on Cardizem drip and his heart rate improved. [facility] consulted for admission to step down. Cardiology consulted as well. Patient is unable to give a good history but is able to answer in yes and no and follow commands well. Patient denies headache, dizziness, fever, chills, nausea , vomiting, chest pain, palpitations, SOB, abdominal pain, bowel or urinary symptoms. No sick contacts or any illness following recent discharge. No change in appetite. Reportedly taking his medications regularly.  Course in the ED Patient in rapid A. fib with heart rate up to 173. Blood pressure, RR  and O2 sats stable. Patient afebrile. Blood work done unremarkable. Initial troponin was negative. EKG showed rapid A. fib with rate of 155 it. Chest x-ray was unremarkable.  Review of Systems:  As outlined in history of present illness. Review of systems Limited due to patient's Aphasia    Past  Medical History  Diagnosis Date  . Coronary atherosclerosis of native coronary artery     a. CABG in 2004 - LIMA to LAD, SVG to diag, SVG to OM, SVG to PDA. EF was 40%. b. Cath 2006 s/p DES to prox RCA.  . Type 2 diabetes mellitus   . Hyperlipidemia   . Essential hypertension, benign   . Atypical atrial flutter     a. Dx 06/2012. b. Not a candidate for anticoag due to fall, tenuous social situation.  . Ischemic cardiomyopathy     a. EF 40% in 2004. b. EF 55% in 06/2012.  Marland Kitchen Aortic stenosis     a. By echo 06/2012, very calcified but mean gradient 33mmHg - will need clinical f/u and serial echoes.  . Low TSH level     a. 06/2012, f/u pcp.  Marland Kitchen Poor social situation   . Cerebral hemorrhage     a. At time of CABG - had post-op encephalopathy and small frontal hemorrhage in 2004.  Marland Kitchen Noncompliance   . Stroke    Past Surgical History  Procedure Laterality Date  . Coronary artery bypass graft      2004  . Cystoscopy N/A 03/18/2013    Procedure: CYSTOSCOPY FLEXIBLE, RECTAL EXAM;  Surgeon: Marissa Nestle, MD;  Location: [facility];  Service: Urology;  Laterality: N/A;   Social History:  reports that he has been smoking Cigarettes.  He has a 12.5 pack-year smoking history. He has never used smokeless tobacco. He reports that he does not drink alcohol or use illicit drugs.  Allergies  Allergen Reactions  . Benadryl [Diphenhydramine  Hcl] Other (See Comments)    Hot Flashes "Like Niaspan"    Family History  Problem Relation Age of Onset  . CAD Father 46    Prior to Admission medications   Medication Sig Start Date End Date Taking? Authorizing Provider  ALPRAZolam Duanne Moron) 1 MG tablet Take 1 mg by mouth 4 (four) times daily.   Yes Historical Provider, MD  aspirin 325 MG tablet Take 1 tablet (325 mg total) by mouth daily. 03/20/13  Yes Nita Sells, MD  diltiazem (CARDIZEM CD) 180 MG 24 hr capsule Take 1 capsule (180 mg total) by mouth daily. 03/20/13  Yes Nita Sells, MD  metFORMIN  (GLUCOPHAGE) 500 MG tablet Take 500 mg by mouth 2 (two) times daily with a meal.   Yes Historical Provider, MD  metoprolol succinate (TOPROL-XL) 50 MG 24 hr tablet Take 50 mg by mouth daily. Take with or immediately following a meal.   Yes Historical Provider, MD  ondansetron (ZOFRAN) 8 MG tablet Take by mouth every 8 (eight) hours as needed for nausea or vomiting.   Yes Historical Provider, MD  potassium chloride SA (K-DUR,KLOR-CON) 20 MEQ tablet Take 1 tablet (20 mEq total) by mouth 2 (two) times daily. 03/20/13  Yes Nita Sells, MD  rosuvastatin (CRESTOR) 40 MG tablet Take 40 mg by mouth daily.   Yes Historical Provider, MD  tamsulosin (FLOMAX) 0.4 MG CAPS capsule Take 1 capsule (0.4 mg total) by mouth daily after supper. 03/20/13  Yes Nita Sells, MD  nitroGLYCERIN (NITROSTAT) 0.4 MG SL tablet Place 0.4 mg under the tongue every 5 (five) minutes as needed for chest pain.    Historical Provider, MD  Vitamin D, Ergocalciferol, (DRISDOL) 50000 UNITS CAPS capsule Take 50,000 Units by mouth every 7 (seven) days.    Historical Provider, MD     Physical Exam:  Filed Vitals:   04/03/13 1600 04/03/13 1615 04/03/13 1626 04/03/13 1630  BP: 112/87 118/77  126/79  Pulse: 92 89 85 85  Temp:      TempSrc:      Resp: 21 21 26 26   Height:      Weight:      SpO2: 98% 98% 97% 97%    Constitutional: Vital signs reviewed.  Patient is an elderly male in no acute distress HEENT: Temporal wasting, no pallor, no icterus, moist oral mucosa, no cervical lymphadenopathy Cardiovascular: S1 and S2 regular,  no murmurs rub or gallop Chest: CTAB, no wheezes, rales, or rhonchi Abdominal: Soft. Non-tender, non-distended, bowel sounds are normal, no masses, organomegaly, or guarding present. Foley in place Ext: warm, no edema Neurological: Alert and awake, answers in yes and no with impaired speech, follows commands well. non focal  Labs on Admission:  Basic Metabolic Panel:  Recent Labs Lab  04/03/13 1415  NA 135*  K 4.4  CL 97  CO2 23  GLUCOSE 142*  BUN 17  CREATININE 1.02  CALCIUM 9.7  MG 2.1   Liver Function Tests: No results found for this basename: AST, ALT, ALKPHOS, BILITOT, PROT, ALBUMIN,  in the last 168 hours No results found for this basename: LIPASE, AMYLASE,  in the last 168 hours No results found for this basename: AMMONIA,  in the last 168 hours CBC:  Recent Labs Lab 04/03/13 1415  WBC 8.8  NEUTROABS 5.8  HGB 15.0  HCT 44.7  MCV 81.3  PLT 256   Cardiac Enzymes:  Recent Labs Lab 04/03/13 1415  TROPONINI <0.30   BNP: No components found with this basename:  POCBNP,  CBG: No results found for this basename: GLUCAP,  in the last 168 hours  Radiological Exams on Admission: Dg Chest Port 1 View  04/03/2013   CLINICAL DATA:  Tachycardia  EXAM: PORTABLE CHEST - 1 VIEW  COMPARISON:  03/16/2013  FINDINGS: Cardiac shadow is stable. Postsurgical changes are again seen. Old rib fractures are again seen on the right. No focal infiltrate or sizable effusion is noted. No acute bony abnormality is seen.  IMPRESSION: No acute abnormality noted.   Electronically Signed   By: Inez Catalina M.D.   On: 04/03/2013 14:36    EKG: A. fib with RVR at 155  Assessment/Plan  Principal Problem:   Atrial fibrillation with rapid ventricular response Admit to stepdown -Patient has history of A. fib and is on aspirin and rate limiting agents.He is not a candidate for anticoagulation with risk for frequent falls and this was discussed during previous hospitalization. -No clear etiology for rapid A. fib. Patient is reportedly taking his medications regularly. Patient started on Cardizem drip in the ED PD at on my evaluation he is in sinus rhythm. Will taper Cardizem drip as tolerated. Resume home dose metoprolol. We will hold by mouth Cardizem at this time. We'll resume once patient off drip. Continue full dose aspirin.  -Appreciate cardiology recommendation. 2-D echo done  within the past month and does not need to be repeated. -Monitor potassium and magnesium and replete as needed   Active Problems:   Coronary artery disease History of CABG and cardiac cath. Continue full dose aspirin and statin. Continue metoprolol    Hypertension  Blood pressure stable. Continue metoprolol. On Cardizem drip     Diabetes mellitus Hold metformin. Maintain on sliding scale insulin    CHF (congestive heart failure) Euvolemic. We'll place him on gentle hydration.    Tobacco abuse Continue nicotine patch  Recent left MCA stroke  Has mild residual right-sided weakness. MRI showed areas of hemorrhage within the left insular cortex and operculum as well. Not a candidate for anticoagulation given frequent falls and noncompliance. Continue full dose aspirin and statins.  BPH with bladder outflow obstruction. Foley  placed on the last admission. Outpatient urology followup with Dr. Michela Pitcher. Continue Flomax    Solitary pulmonary nodule/ left adrenal mass Seen on the patient workup. Given history of COPD and active tobacco use concern for lung malignancy. Recommended for outpatient PET scan which PCP was going to arrange as outpatient  Descending thoracic aortic aneurysm Increase in size of aneurysm from 4.3 cm in 2012-5.7 cm on recent CT scan. Patient supposed to follow with Dr. Trula Slade as outpatient. 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  Protein calorie malnutrition Recommended for Glucerna 3 times a day between meals on recent hospitalization. We'll add the  supplements.          Diet: Diabetic  DVT prophylaxis: sq lovenox   Code Status: full code/ Family Communication: None at bedside (patient is with family friends, does not have any close family besides them) Disposition Plan: Home once stable  Jacquan Savas, [place] [facility] Pager [phone]  Total time spent on admission :70 minutes  If 7PM-7AM, please contact  night-coverage [email or web address] Password [email or web address] 04/03/2013, 5:25 PM

## 2013-04-03 NOTE — Consult Note (Signed)
Primary Physician: Primary Cardiologist:  Hochrein   HPI: [name] is a 68 yo who is followed by Vita Barley  He has a history of atrial fib/flutter, CAD (s/p CABG in 2004; s/p DES to prox RCA 2006), mild aortic stenosis, thoracic aortic aneurysm and prostate cancer.   He was recently admitted for CVA and infection.   He was discharged on rate control and no coumadin.    Today, visiting nurse was at patient's house.  Noted HR to be severely elevated (170s)  Son reports that it was low as well (30s).  Told to come to ER  Patient had low grade fever yesterday.  No CP  No SOB  Drinking ensure.         Past Medical History  Diagnosis Date  . Coronary atherosclerosis of native coronary artery     a. CABG in 2004 - LIMA to LAD, SVG to diag, SVG to OM, SVG to PDA. EF was 40%. b. Cath 2006 s/p DES to prox RCA.  . Type 2 diabetes mellitus   . Hyperlipidemia   . Essential hypertension, benign   . Atypical atrial flutter     a. Dx 06/2012. b. Not a candidate for anticoag due to fall, tenuous social situation.  . Ischemic cardiomyopathy     a. EF 40% in 2004. b. EF 55% in 06/2012.  Marland Kitchen Aortic stenosis     a. By echo 06/2012, very calcified but mean gradient 60mmHg - will need clinical f/u and serial echoes.  . Low TSH level     a. 06/2012, f/u pcp.  Marland Kitchen Poor social situation   . Cerebral hemorrhage     a. At time of CABG - had post-op encephalopathy and small frontal hemorrhage in 2004.  Marland Kitchen Noncompliance   . Stroke      (Not in a hospital admission)      Infusions: . sodium chloride 75 mL/hr at 04/03/13 1431    Allergies  Allergen Reactions  . Benadryl [Diphenhydramine Hcl] Other (See Comments)    Hot Flashes "Like Niaspan"    History   Social History  . Marital Status: Divorced    Spouse Name: N/A    Number of Children: 2  . Years of Education: N/A   Occupational History  . Not on file.   Social History Main Topics  . Smoking status: Current Every Day Smoker --  0.25 packs/day for 50 years    Types: Cigarettes  . Smokeless tobacco: Never Used  . Alcohol Use: No     Comment: Pt states he has not drank in 2 years  . Drug Use: No  . Sexual Activity: No   Other Topics Concern  . Not on file   Social History Narrative   Lives with two friends.      Family History  Problem Relation Age of Onset  . CAD Father 68   Prior Cardiac Testing/Procedures  1. Echocardiogram: 03/02/2013  Left ventricle: The cavity size was normal. Wall thickness was increased in a pattern of moderate LVH. Systolic function was normal. The estimated ejection fraction was in the range of 50% to 55%. There is hypokinesis of the basalinferolateral myocardium. Features are consistent with a possible pseudonormal left ventricular filling pattern, with concomitant abnormal relaxation and increased filling pressure (grade 2 diastolic dysfunction). - Aortic valve: Mildly calcified annulus. Trileaflet; moderately calcified leaflets. Cusp separation was moderately to severely reduced. There was moderate stenosis. Trivial regurgitation. Mean gradient: 43mm Hg (S). Peak gradient: 68mm  Hg (S). Valve area: 1.29cm^2(VTI). Valve area: 1.19cm^2 (Vmax). - Aortic root: The aortic root was mildly to moderately dilated. - Mitral valve: Calcified annulus. Mildly thickened, mildly calcified leaflets . Leaflet separation was mildly reduced. Mild to moderate regurgitation. Regurgitant volume 35 mL by continuity equation. - Left atrium: The atrium was severely dilated. - Right ventricle: The cavity size was moderately dilated. Systolic function was moderately reduced. - Right atrium: The atrium was moderately dilated. Central venous pressure: 63mm Hg (est). - Atrial septum: No defect or patent foramen ovale was identified. - Tricuspid valve: Mild regurgitation. - Pulmonary arteries: PA peak pressure: 92mm Hg (S). - Pericardium, extracardiac: There was no  pericardial effusion.  2.Carotid Ultrasound 03/11/2013  IMPRESSION Rather extensive amount of irregular atherosclerotic plaque bilaterally, not definitely resulting in hemodynamically significant stenosis though could serve as a source of distal embolism. Further evaluation could be performed with CTA as clinically indicated.  3. Cardiac Cath 07/2004  ANGIOGRAPHIC DATA: The right coronary artery was a large, dominant vessel  that supplied that was very tortuous and calcified proximally. There was  approximately 40% narrowing in the region of the ostium.  There was least 90% to 95% stenosis at the site of dilatation from two days  ago, suggesting significant elastic recoil in this calcified region. After  this proximal bend, there was again mild 30-40% narrowings diffusely  throughout the midportion of the vessel. The vessel gave rise to a large  PDA and large posterolateral system. Following successful PTCA with  ultimate stenting, the 90-95% stenosis was reduced to 0% with ultimately a  3.0 x 12 mm drug-eluting paclitaxel Taxus stent being postdilated to 3.5 mm.  There was TIMI-3 flow. There was no evidence for dissection.  CABG 11/30/2002  1. Median sternotomy.  2. Extracorporeal circulation.  3. Coronary artery bypass grafting x 4 (left internal mammary artery to LAD,  saphenous vein graft to second diagonal, saphenous vein graft to first  obtuse marginal, saphenous vein graft to posterior descending),  endoscopic vein harvest right leg.      REVIEW OF SYSTEMS:  All systems reviewed  Negative to the above problem except as noted above.    PHYSICAL EXAM: Filed Vitals:   04/03/13 1626  BP:   Pulse: 85  Temp:   Resp: 26    No intake or output data in the 24 hours ending 04/03/13 1627  General: Thin 68 yo in NAD HEENT: mucous membranes dry.   Neck: supple. no JVD. Carotids 2+ bilat; no bruits. Cor: Irregular rate & rhythm. No rubs, gallops or murmurs. Lungs: clear to  auscul   Abdomen: soft, nontender, nondistended. No hepatosplenomegaly. Good bowel sounds. Extremities: no cyanosis, clubbing, rash, edema Neuro:Responds approp to some questions  Otherwise deferred.    ECG:  Atrial fib/flutter  155 bpm  RBBB.  Results for orders placed during the hospital encounter of 04/03/13 (from the past 24 hour(s))  CBC WITH DIFFERENTIAL     Status: Abnormal   Collection Time    04/03/13  2:15 PM      Result Value Ref Range   WBC 8.8  4.0 - 10.5 K/uL   RBC 5.50  4.22 - 5.81 MIL/uL   Hemoglobin 15.0  13.0 - 17.0 g/dL   HCT 44.7  39.0 - 52.0 %   MCV 81.3  78.0 - 100.0 fL   MCH 27.3  26.0 - 34.0 pg   MCHC 33.6  30.0 - 36.0 g/dL   RDW 15.8 (*) 11.5 - 15.5 %  Platelets 256  150 - 400 K/uL   Neutrophils Relative % 66  43 - 77 %   Neutro Abs 5.8  1.7 - 7.7 K/uL   Lymphocytes Relative 19  12 - 46 %   Lymphs Abs 1.6  0.7 - 4.0 K/uL   Monocytes Relative 11  3 - 12 %   Monocytes Absolute 1.0  0.1 - 1.0 K/uL   Eosinophils Relative 4  0 - 5 %   Eosinophils Absolute 0.3  0.0 - 0.7 K/uL   Basophils Relative 0  0 - 1 %   Basophils Absolute 0.0  0.0 - 0.1 K/uL  BASIC METABOLIC PANEL     Status: Abnormal   Collection Time    04/03/13  2:15 PM      Result Value Ref Range   Sodium 135 (*) 137 - 147 mEq/L   Potassium 4.4  3.7 - 5.3 mEq/L   Chloride 97  96 - 112 mEq/L   CO2 23  19 - 32 mEq/L   Glucose, Bld 142 (*) 70 - 99 mg/dL   BUN 17  6 - 23 mg/dL   Creatinine, Ser 5.17  0.50 - 1.35 mg/dL   Calcium 9.7  8.4 - 61.6 mg/dL   GFR calc non Af Amer 73 (*) >90 mL/min   GFR calc Af Amer 85 (*) >90 mL/min  TROPONIN I     Status: None   Collection Time    04/03/13  2:15 PM      Result Value Ref Range   Troponin I <0.30  <0.30 ng/mL  MAGNESIUM     Status: None   Collection Time    04/03/13  2:15 PM      Result Value Ref Range   Magnesium 2.1  1.5 - 2.5 mg/dL  URINALYSIS, ROUTINE W REFLEX MICROSCOPIC     Status: Abnormal   Collection Time    04/03/13  3:55 PM       Result Value Ref Range   Color, Urine YELLOW  YELLOW   APPearance CLEAR  CLEAR   Specific Gravity, Urine 1.025  1.005 - 1.030   pH 5.5  5.0 - 8.0   Glucose, UA NEGATIVE  NEGATIVE mg/dL   Hgb urine dipstick TRACE (*) NEGATIVE   Bilirubin Urine NEGATIVE  NEGATIVE   Ketones, ur NEGATIVE  NEGATIVE mg/dL   Protein, ur NEGATIVE  NEGATIVE mg/dL   Urobilinogen, UA 1.0  0.0 - 1.0 mg/dL   Nitrite POSITIVE (*) NEGATIVE   Leukocytes, UA SMALL (*) NEGATIVE  URINE MICROSCOPIC-ADD ON     Status: Abnormal   Collection Time    04/03/13  3:55 PM      Result Value Ref Range   Squamous Epithelial / LPF FEW (*) RARE   WBC, UA 7-10  <3 WBC/hpf   RBC / HPF 0-2  <3 RBC/hpf   Bacteria, UA MANY (*) RARE   Dg Chest Port 1 View  04/03/2013   CLINICAL DATA:  Tachycardia  EXAM: PORTABLE CHEST - 1 VIEW  COMPARISON:  03/16/2013  FINDINGS: Cardiac shadow is stable. Postsurgical changes are again seen. Old rib fractures are again seen on the right. No focal infiltrate or sizable effusion is noted. No acute bony abnormality is seen.  IMPRESSION: No acute abnormality noted.   Electronically Signed   By: Alcide Clever M.D.   On: 04/03/2013 14:36     ASSESSMENT:  1.  Atrial fibrillation.  Rates are better now on IV diltiazem  Son reports that  pt is taking meds.  Bowels moving.  ? Absorption.  ? If something driving (infection, dehydration)  Check urine.  Could try gentle hydration.  Keep on current IV regimen of dilt and then switch to PO with dosing changes.  Keep on telemetry. Patient is not a candidate for anticoagulation with history of intracranial bleed  2.  CAD  I am not convinced of any active ischemia.   3.  PAD  Patient with signif PAD with 5.7 cm thoracic aneurysm

## 2013-04-03 NOTE — ED Provider Notes (Signed)
CSN: [id number]     Arrival date & time 04/03/13  1350 History   First MD Initiated Contact with Patient 04/03/13 1412     Chief Complaint  Patient presents with  . Tachycardia     HPI Pt was seen at 1420. Per pt and his son, c/o gradual onset and persistence of constant "fast heart rate" that was noticed by his Home Health RN today PTA. Pt's Home Health RN told pt's son that pt's "HR was in the 170's" and "then down in the 30's." Pt was then sent to the ED for further evaluation. Pt has aphasia due to recent CVA. Pt denies CP/palpitaitons, no SOB/cough, no abd pain, no N/V/D, no back pain, no syncope/near syncope.    Past Medical History  Diagnosis Date  . Coronary atherosclerosis of native coronary artery     a. CABG in 2004 - LIMA to LAD, SVG to diag, SVG to OM, SVG to PDA. EF was 40%. b. Cath 2006 s/p DES to prox RCA.  . Type 2 diabetes mellitus   . Hyperlipidemia   . Essential hypertension, benign   . Atypical atrial flutter     a. Dx 06/2012. b. Not a candidate for anticoag due to fall, tenuous social situation.  . Ischemic cardiomyopathy     a. EF 40% in 2004. b. EF 55% in 06/2012.  Marland Kitchen Aortic stenosis     a. By echo 06/2012, very calcified but mean gradient 25mmHg - will need clinical f/u and serial echoes.  . Low TSH level     a. 06/2012, f/u pcp.  Marland Kitchen Poor social situation   . Cerebral hemorrhage     a. At time of CABG - had post-op encephalopathy and small frontal hemorrhage in 2004.  Marland Kitchen Noncompliance   . Stroke    Past Surgical History  Procedure Laterality Date  . Coronary artery bypass graft      2004  . Cystoscopy N/A 03/18/2013    Procedure: CYSTOSCOPY FLEXIBLE, RECTAL EXAM;  Surgeon: Marissa Nestle, MD;  Location: [facility];  Service: Urology;  Laterality: N/A;   Family History  Problem Relation Age of Onset  . CAD Father 10   History  Substance Use Topics  . Smoking status: Current Every Day Smoker -- 0.25 packs/day for 50 years    Types: Cigarettes  . Smokeless  tobacco: Never Used  . Alcohol Use: No     Comment: Pt states he has not drank in 2 years    Review of Systems ROS: Statement: All systems negative except as marked or noted in the HPI; Constitutional: Negative for fever and chills. ; ; Eyes: Negative for eye pain, redness and discharge. ; ; ENMT: Negative for ear pain, hoarseness, nasal congestion, sinus pressure and sore throat. ; ; Cardiovascular: Negative for chest pain, palpitations, diaphoresis, dyspnea and peripheral edema. ; ; Respiratory: Negative for cough, wheezing and stridor. ; ; Gastrointestinal: Negative for nausea, vomiting, diarrhea, abdominal pain, blood in stool, hematemesis, jaundice and rectal bleeding. . ; ; Genitourinary: Negative for dysuria, flank pain and hematuria. ; ; Musculoskeletal: Negative for back pain and neck pain. Negative for swelling and trauma.; ; Skin: Negative for pruritus, rash, abrasions, blisters, bruising and skin lesion.; ; Neuro: Negative for headache, lightheadedness and neck stiffness. Negative for weakness, altered level of consciousness , altered mental status, extremity weakness, paresthesias, involuntary movement, seizure and syncope.      Allergies  Benadryl  Home Medications   Current Outpatient Rx  Name  Route  Sig  Dispense  Refill  . ALPRAZolam (XANAX) 1 MG tablet   Oral   Take 1 mg by mouth 4 (four) times daily.         Marland Kitchen aspirin 325 MG tablet   Oral   Take 1 tablet (325 mg total) by mouth daily.   30 tablet   0   . diltiazem (CARDIZEM CD) 180 MG 24 hr capsule   Oral   Take 1 capsule (180 mg total) by mouth daily.   30 capsule   0   . metFORMIN (GLUCOPHAGE) 500 MG tablet   Oral   Take 500 mg by mouth 2 (two) times daily with a meal.         . metoprolol succinate (TOPROL-XL) 50 MG 24 hr tablet   Oral   Take 50 mg by mouth daily. Take with or immediately following a meal.         . ondansetron (ZOFRAN) 8 MG tablet   Oral   Take by mouth every 8 (eight) hours as  needed for nausea or vomiting.         . potassium chloride SA (K-DUR,KLOR-CON) 20 MEQ tablet   Oral   Take 1 tablet (20 mEq total) by mouth 2 (two) times daily.   30 tablet   0   . rosuvastatin (CRESTOR) 40 MG tablet   Oral   Take 40 mg by mouth daily.         . tamsulosin (FLOMAX) 0.4 MG CAPS capsule   Oral   Take 1 capsule (0.4 mg total) by mouth daily after supper.   30 capsule   0   . nitroGLYCERIN (NITROSTAT) 0.4 MG SL tablet   Sublingual   Place 0.4 mg under the tongue every 5 (five) minutes as needed for chest pain.         . Vitamin D, Ergocalciferol, (DRISDOL) 50000 UNITS CAPS capsule   Oral   Take 50,000 Units by mouth every 7 (seven) days.          BP 121/70  Pulse 58  Temp(Src) 97.4 F (36.3 C) (Oral)  Resp 25  Ht 6\' 1"  (1.854 m)  Wt 150 lb (68.04 kg)  BMI 19.79 kg/m2  SpO2 96% Physical Exam 1425: Physical examination:  Nursing notes reviewed; Vital signs and O2 SAT reviewed;  Constitutional: Well developed, Well nourished, In no acute distress; Head:  Normocephalic, atraumatic; Eyes: EOMI, PERRL, No scleral icterus; ENMT: Mouth and pharynx normal, Mucous membranes dry; Neck: Supple, Full range of motion, No lymphadenopathy; Cardiovascular: Tachycardic rate, Irregular irregular rhythm, No gallop; Respiratory: Breath sounds clear & equal bilaterally, No wheezes.  Speaking in few words per baseline. Normal respiratory effort/excursion; Chest: Nontender, Movement normal; Abdomen: Soft, Nontender, Nondistended, Normal bowel sounds; Genitourinary: No CVA tenderness; Extremities: Pulses normal, No tenderness, No edema, No calf edema or asymmetry.; Neuro: AA&Ox3, Aphasic per hx previous CVA. Moves all extremities on stretcher spontaneously..; Skin: Color normal, Warm, Dry.   ED Course  Procedures    EKG Interpretation None      MDM  MDM Reviewed: previous chart, nursing note and vitals Reviewed previous: labs and ECG Interpretation: labs, ECG and  x-ray Total time providing critical care: 30-74 minutes. This excludes time spent performing separately reportable procedures and services. Consults: admitting MD    CRITICAL CARE Performed by: Alfonzo Feller Total critical care time: 35 Critical care time was exclusive of separately billable procedures and treating other patients. Critical care was necessary to  treat or prevent imminent or life-threatening deterioration. Critical care was time spent personally by me on the following activities: development of treatment plan with patient and/or surrogate as well as nursing, discussions with consultants, evaluation of patient's response to treatment, examination of patient, obtaining history from patient or surrogate, ordering and performing treatments and interventions, ordering and review of laboratory studies, ordering and review of radiographic studies, pulse oximetry and re-evaluation of patient's condition.     Date: 04/03/2013  Rate: 155  Rhythm: atrial fibrillation  QRS Axis: left  Intervals: normal  ST/T Wave abnormalities: nonspecific ST/T changes  Conduction Disutrbances:right bundle branch block  Narrative Interpretation:   Old EKG Reviewed: changes noted; rate faster compared with previous EKG dated 03/11/2013.  Results for orders placed during the hospital encounter of 04/03/13  CBC WITH DIFFERENTIAL      Result Value Ref Range   WBC 8.8  4.0 - 10.5 K/uL   RBC 5.50  4.22 - 5.81 MIL/uL   Hemoglobin 15.0  13.0 - 17.0 g/dL   HCT 44.7  39.0 - 52.0 %   MCV 81.3  78.0 - 100.0 fL   MCH 27.3  26.0 - 34.0 pg   MCHC 33.6  30.0 - 36.0 g/dL   RDW 15.8 (*) 11.5 - 15.5 %   Platelets 256  150 - 400 K/uL   Neutrophils Relative % 66  43 - 77 %   Neutro Abs 5.8  1.7 - 7.7 K/uL   Lymphocytes Relative 19  12 - 46 %   Lymphs Abs 1.6  0.7 - 4.0 K/uL   Monocytes Relative 11  3 - 12 %   Monocytes Absolute 1.0  0.1 - 1.0 K/uL   Eosinophils Relative 4  0 - 5 %   Eosinophils Absolute  0.3  0.0 - 0.7 K/uL   Basophils Relative 0  0 - 1 %   Basophils Absolute 0.0  0.0 - 0.1 K/uL  BASIC METABOLIC PANEL      Result Value Ref Range   Sodium 135 (*) 137 - 147 mEq/L   Potassium 4.4  3.7 - 5.3 mEq/L   Chloride 97  96 - 112 mEq/L   CO2 23  19 - 32 mEq/L   Glucose, Bld 142 (*) 70 - 99 mg/dL   BUN 17  6 - 23 mg/dL   Creatinine, Ser 1.02  0.50 - 1.35 mg/dL   Calcium 9.7  8.4 - 10.5 mg/dL   GFR calc non Af Amer 73 (*) >90 mL/min   GFR calc Af Amer 85 (*) >90 mL/min  TROPONIN I      Result Value Ref Range   Troponin I <0.30  <0.30 ng/mL  MAGNESIUM      Result Value Ref Range   Magnesium 2.1  1.5 - 2.5 mg/dL   Dg Chest Port 1 View 04/03/2013   CLINICAL DATA:  Tachycardia  EXAM: PORTABLE CHEST - 1 VIEW  COMPARISON:  03/16/2013  FINDINGS: Cardiac shadow is stable. Postsurgical changes are again seen. Old rib fractures are again seen on the right. No focal infiltrate or sizable effusion is noted. No acute bony abnormality is seen.  IMPRESSION: No acute abnormality noted.   Electronically Signed   By: Inez Catalina M.D.   On: 04/03/2013 14:36    1530: IV cardizem bolus and gtt started on arrival for afib/RVR, rate 170's. Pt's HR now trending downward to 80's. SBP remains stable. Dx and testing d/w pt and family.  Questions answered.  Verb understanding,  agreeable to admit. T/C to [facility]. Clementeen Graham, case discussed, including:  HPI, pertinent PM/SHx, VS/PE, dx testing, ED course and treatment:  Agreeable to admit, requests to write temporary orders and consult Cards MD, obtain stepdown bed. T/C to [facility] Dr. Harrington Challenger, case discussed, including:  HPI, pertinent PM/SHx, VS/PE, dx testing, ED course and treatment:  Agreeable to consult.         Alfonzo Feller, DO 04/06/13 1301

## 2013-04-03 NOTE — ED Notes (Signed)
Pt sent by home health nurse for confusion and rapid HR, 170's. Onset of confusion and weakness 12 hrs prior.

## 2013-04-04 DIAGNOSIS — E119 Type 2 diabetes mellitus without complications: Secondary | ICD-10-CM

## 2013-04-04 LAB — GLUCOSE, CAPILLARY
GLUCOSE-CAPILLARY: 116 mg/dL — AB (ref 70–99)
GLUCOSE-CAPILLARY: 143 mg/dL — AB (ref 70–99)
Glucose-Capillary: 107 mg/dL — ABNORMAL HIGH (ref 70–99)
Glucose-Capillary: 165 mg/dL — ABNORMAL HIGH (ref 70–99)

## 2013-04-04 MED ORDER — GLUCERNA SHAKE PO LIQD
237.0000 mL | Freq: Three times a day (TID) | ORAL | Status: DC
  Administered 2013-04-04 – 2013-04-09 (×15): 237 mL via ORAL

## 2013-04-04 MED ORDER — ALPRAZOLAM 1 MG PO TABS
1.0000 mg | ORAL_TABLET | Freq: Three times a day (TID) | ORAL | Status: DC | PRN
  Administered 2013-04-09: 1 mg via ORAL
  Filled 2013-04-04: qty 1

## 2013-04-04 MED ORDER — DILTIAZEM HCL 30 MG PO TABS
30.0000 mg | ORAL_TABLET | Freq: Four times a day (QID) | ORAL | Status: DC
  Administered 2013-04-04 – 2013-04-05 (×4): 30 mg via ORAL
  Filled 2013-04-04 (×4): qty 1

## 2013-04-04 NOTE — Progress Notes (Signed)
INITIAL NUTRITION ASSESSMENT  DOCUMENTATION CODES Per approved criteria  -Moderate malnutrition in the context of chronic illness   Pt meets criteria for moderate MALNUTRITION in the context of chronic illness as evidenced by  wt loss >5% x 1 month and severe muscle wasting .  INTERVENTION: Glucerna TID Encourage meal and oral supplement intake Recommend ST to follow-up for possible changes in swallow function  NUTRITION DIAGNOSIS: Inadequate oral intake related to poor appetite, as evidenced by recent meal intake hx and severe weight loss.  Goal: -Pt will consume oral supplement for a source of protein and calories.  -Pt will meet >90% of estimated nutritional needs.   Monitor:  PO intake, supplement acceptance, labs, weight changes, skin assessment, I/O's  Reason for Assessment: Malnutrition screen= 3  68 y.o. male  Admitting Dx: Atrial fibrillation with rapid ventricular response  ASSESSMENT: 68 year old male who has a past medical history of Coronary artery dz; DM Type 2; HLD, HTN; Cerebral hemorrhage; and Noncompliance. On 03/11/13 presented to ED with stroke. Hx of recurrent falls and poor social situation. Pt-reports regular diet at home with decreased appetite. He says "I eat whatever they give me". He is unable to recall meals, types, names of any foods he usually eats.Poor meal  intake hx noted per follow-up RD note on 03/18/13- 25-50% of meals. His weight at that time was 167#. Pt-is edentulous and has dentures but doesn't wear them. He denies chewing difficulty. Pt-feeds himself and is receiving Glucerna TID which is likes. He was evaluated by ST on 03/18/13. Dys 3 with thin liquids recommended. Pt has severe weight loss (25#,15%) since his stroke according to hospital records. He is unable to provide usual weight hx therefore is difficult to quantify actual percent weight change.   Nutrition Focused Physical Exam:   Subcutaneous Fat:  Orbital Region: mild  depletion Upper Arm Region: mild depletion Thoracic and Lumbar Region: WDL  Muscle:  Temple Region: Severe depletion  Clavicle Bone Region: Moderate depletion  Clavicle and Acromion Bone Region: Moderate depletion  Scapular Bone Region: N/A  Dorsal Hand: WDL Patellar Region: mild depletion Anterior Thigh Region: mild depletion Posterior Calf Region: WDL   Edema: none   Height: Ht Readings from Last 1 Encounters:  04/03/13 6\' 1"  (1.854 m)    Weight: Wt Readings from Last 1 Encounters:  04/04/13 141 lb 15.6 oz (64.4 kg)    Ideal Body Weight: 184# (83.6 kg)  % Ideal Body Weight: 77%  Wt Readings from Last 10 Encounters:  04/04/13 141 lb 15.6 oz (64.4 kg)  03/16/13 167 lb 8.8 oz (76 kg)  03/16/13 167 lb 8.8 oz (76 kg)  03/02/13 156 lb 15.5 oz (71.2 kg)  11/04/12 162 lb (73.483 kg)  11/02/12 162 lb 0.6 oz (73.5 kg)  06/06/12 164 lb (74.39 kg)  06/04/12 154 lb 9.6 oz (70.126 kg)  12/17/10 183 lb (83.008 kg)  11/16/10 182 lb (82.555 kg)    Usual Body Weight: 156-167#  % Usual Body Weight: 85%  BMI:  Body mass index is 18.74 kg/(m^2). normal range  Estimated Nutritional Needs: Kcal: 5462-7035 Protein: 80-98 gr Fluid: 1.9-2.2 liters daily  Skin: hx of healed diabetic ulcer to buttock and coccyx  Diet Order: Dysphagia 3 thin liquids  EDUCATION NEEDS: -Education needs addressed   Intake/Output Summary (Last 24 hours) at 04/04/13 1104 Last data filed at 04/04/13 0500  Gross per 24 hour  Intake 581.25 ml  Output    900 ml  Net -318.75 ml  Last BM: 04/03/13  Labs:   Recent Labs Lab 04/03/13 1415  NA 135*  K 4.4  CL 97  CO2 23  BUN 17  CREATININE 1.02  CALCIUM 9.7  MG 2.1  GLUCOSE 142*    CBG (last 3)   Recent Labs  04/03/13 2114 04/04/13 0745  GLUCAP 169* 107*    Scheduled Meds: . antiseptic oral rinse  15 mL Mouth Rinse BID  . aspirin  325 mg Oral Daily  . atorvastatin  80 mg Oral q1800  . diltiazem  30 mg Oral 4 times per day   . feeding supplement (GLUCERNA SHAKE)  237 mL Oral TID BM  . insulin aspart  0-15 Units Subcutaneous TID WC  . metoprolol succinate  50 mg Oral Daily  . potassium chloride SA  20 mEq Oral BID  . sodium chloride  3 mL Intravenous Q12H  . tamsulosin  0.4 mg Oral QPC supper  . [START ON 04/06/2013] Vitamin D (Ergocalciferol)  50,000 Units Oral Q7 days    Continuous Infusions: . sodium chloride 75 mL/hr at 04/04/13 0416    Past Medical History  Diagnosis Date  . Coronary atherosclerosis of native coronary artery     a. CABG in 2004 - LIMA to LAD, SVG to diag, SVG to OM, SVG to PDA. EF was 40%. b. Cath 2006 s/p DES to prox RCA.  . Type 2 diabetes mellitus   . Hyperlipidemia   . Essential hypertension, benign   . Atypical atrial flutter     a. Dx 06/2012. b. Not a candidate for anticoag due to fall, tenuous social situation.  . Ischemic cardiomyopathy     a. EF 40% in 2004. b. EF 55% in 06/2012.  Marland Kitchen Aortic stenosis     a. By echo 06/2012, very calcified but mean gradient 20mmHg - will need clinical f/u and serial echoes.  . Low TSH level     a. 06/2012, f/u pcp.  Marland Kitchen Poor social situation   . Cerebral hemorrhage     a. At time of CABG - had post-op encephalopathy and small frontal hemorrhage in 2004.  Marland Kitchen Noncompliance   . Stroke     Past Surgical History  Procedure Laterality Date  . Coronary artery bypass graft      2004  . Cystoscopy N/A 03/18/2013    Procedure: CYSTOSCOPY FLEXIBLE, RECTAL EXAM;  Surgeon: Marissa Nestle, MD;  Location: [facility];  Service: Urology;  Laterality: N/A;    Colman Cater MS,RD,CSG,LDN Office: [phone] Pager: [phone]

## 2013-04-04 NOTE — Progress Notes (Signed)
PROGRESS NOTE  [name] VQM:[id number] DOB: [birth year] DOA: 04/03/2013 PCP: Carolee Rota, NP Primary Cardiologist: Hochrein  Summary: 68 year old male with recent history of extensive left MCA stroke with some right-sided weakness and aphasia, (not a candidate for anticoagulation given frequent falls and noncompliance) paroxysmal A. Fib, was discharged home with home health on 3/20 after a long hospital stay was found by his home health today to have a pulse of 30 and brought to the hospital. Patient denied any symptoms. In the ED he was found to be in rapid A. fib with heart rate up to 170s.   Assessment/Plan: 1. Afibrillation with rapid ventricular response. This responded well to IV diltiazem infusion. No definite evidence of infection. Likely secondary to noncompliance with metoprolol (caretaker reported she does not have this medication). Not a candidate for anticoagulation secondary to intracranial bleed. 2. Recent left MCA stroke. Has mild residual right-sided weakness. Continue full dose aspirin and statins. 3. H/o CAD (s/p CABG in 2004; s/p DES to prox RCA 2006), mild aortic stenosis. Appears to be stable. 4. Diabetes mellitus type 2. Stable. 5. Thoracic aortic aneurysm and prostate cancer. Followup as an outpatient. 6. History of atypical atrial flutter/not a candidate for anticoagulation secondary to falls. 7. BPH with bladder outflow obstruction. Continue Foley. Followup with urology as an outpatient. Continue Flomax. 8. Solitary pulmonary nodule/ left adrenal mass  9. Noted on previous admission. Given history of COPD and active tobacco use concern for lung malignancy. Recommended for outpatient PET scan which PCP was going to arrange as outpatient 10. Descending thoracic aortic aneurysm. Asymptomatic. Increase in size of aneurysm from 4.3 cm in 2012-5.7 cm on recent CT scan. Patient supposed to follow with Dr. Trula Slade as outpatient. Also has 75% stenosis of the right carotid  artery. 11. Protein calorie malnutrition. Recommended for Glucerna 3 times a day between meals on recent hospitalization.  12. Equivocal urinalysis. Followup culture.   Transition to oral diltiazem short-acting, follow BP. Continue oral metoprolol at lower dose.   May be able to transfer to the floor later today.  Possible discharge next one to 2 days.  Pending studies:   Urine culture  Code Status: full code DVT prophylaxis: SCDs Family Communication: none present Disposition Plan: home when improved  Murray Hodgkins, MD  [facility]  Pager [phone] If 7PM-7AM, please contact night-coverage at [email or web address], password [facility] 04/04/2013, 8:19 AM  LOS: 1 day   Consultants:  Cardiology  Procedures:    Antibiotics:    HPI/Subjective: No complaints today. Feeling better. Unable to speak but answers yes/no understandably. No chest pain, shortness of breath, nausea or vomiting.  Objective: Filed Vitals:   04/04/13 0715 04/04/13 0730 04/04/13 0745 04/04/13 0800  BP: 86/54 101/67 92/63 90/62   Pulse:  87  84  Temp:      TempSrc:      Resp: 23 31 26 26   Height:      Weight:      SpO2: 99% 100% 88% 100%    Intake/Output Summary (Last 24 hours) at 04/04/13 0819 Last data filed at 04/04/13 0500  Gross per 24 hour  Intake 581.25 ml  Output    900 ml  Net -318.75 ml     Filed Weights   04/03/13 1404 04/04/13 0500  Weight: 68.04 kg (150 lb) 64.4 kg (141 lb 15.6 oz)    Exam:   Afebrile, heart rate controlled. Systolic blood pressure 80 to 90s.  Gen. Appears calm and comfortable. Nontoxic.  Respiratory clear to auscultation bilaterally.  No wheezes, rales or rhonchi. Normal respiratory effort.  Cardiovascular regular rate and rhythm. No murmur, rub or gallop. No lower extremity edema.  Abdomen soft nontender nondistended. Skin appears grossly unremarkable.  Psychiatric grossly normal mood and affect. Unable to speak secondary to stroke. Follows  commands. Mild right-sided residual weakness again seen.  Data Reviewed:  Capillary blood sugars stable.  Basic metabolic panel unremarkable. Magnesium normal.  CBC, troponin unremarkable.  Urinalysis equivocal, urine culture pending.  Scheduled Meds: . ALPRAZolam  1 mg Oral QID  . antiseptic oral rinse  15 mL Mouth Rinse BID  . aspirin  325 mg Oral Daily  . atorvastatin  80 mg Oral q1800  . enoxaparin (LOVENOX) injection  40 mg Subcutaneous Q24H  . feeding supplement (GLUCERNA SHAKE)  237 mL Oral TID BM  . insulin aspart  0-15 Units Subcutaneous TID WC  . metoprolol succinate  50 mg Oral Daily  . potassium chloride SA  20 mEq Oral BID  . sodium chloride  3 mL Intravenous Q12H  . tamsulosin  0.4 mg Oral QPC supper  . [START ON 04/06/2013] Vitamin D (Ergocalciferol)  50,000 Units Oral Q7 days   Continuous Infusions: . sodium chloride 75 mL/hr at 04/04/13 0416    Principal Problem:   Atrial fibrillation with rapid ventricular response Active Problems:   Coronary artery disease   Hypertension   Atypical atrial flutter   Diabetes mellitus   Tobacco abuse   Stroke   Solitary pulmonary nodule   Benign prostatic hypertrophy   COPD (chronic obstructive pulmonary disease)   Aphasia due to recent cerebrovascular accident   AAA (abdominal aortic aneurysm)   Time spent 35 minutes

## 2013-04-05 DIAGNOSIS — E440 Moderate protein-calorie malnutrition: Secondary | ICD-10-CM | POA: Insufficient documentation

## 2013-04-05 LAB — GLUCOSE, CAPILLARY
GLUCOSE-CAPILLARY: 130 mg/dL — AB (ref 70–99)
Glucose-Capillary: 109 mg/dL — ABNORMAL HIGH (ref 70–99)
Glucose-Capillary: 134 mg/dL — ABNORMAL HIGH (ref 70–99)
Glucose-Capillary: 115 mg/dL — ABNORMAL HIGH (ref 70–99)

## 2013-04-05 MED ORDER — DILTIAZEM HCL ER COATED BEADS 120 MG PO CP24
120.0000 mg | ORAL_CAPSULE | Freq: Every day | ORAL | Status: DC
  Administered 2013-04-05 – 2013-04-09 (×5): 120 mg via ORAL
  Filled 2013-04-05 (×5): qty 1

## 2013-04-05 MED ORDER — METOPROLOL SUCCINATE ER 50 MG PO TB24
75.0000 mg | ORAL_TABLET | Freq: Every day | ORAL | Status: DC
  Administered 2013-04-05: 75 mg via ORAL
  Filled 2013-04-05 (×2): qty 1

## 2013-04-05 NOTE — Progress Notes (Signed)
PROGRESS NOTE  [name] HCW:[id number] DOB: 01-02-1946 DOA: 04/03/2013 PCP: Carolee Rota, NP Primary Cardiologist: Hochrein  Summary: 68 year old male with recent history of extensive left MCA stroke with some right-sided weakness and aphasia, (not a candidate for anticoagulation given frequent falls and noncompliance) paroxysmal A. Fib, was discharged home with home health on 3/20 after a long hospital stay was found by his home health today to have a pulse of 30 and brought to the hospital. Patient denied any symptoms. In the ED he was found to be in rapid A. fib with heart rate up to 170s.   Assessment/Plan: 1. Afibrillation with rapid ventricular response. Responded well to IV diltiazem infusion. No definite evidence of infection. Likely secondary to noncompliance with metoprolol (caretaker reported she does not have this medication). Not a candidate for anticoagulation secondary to intracranial bleed. Adjust CCB as below. 2. Recent left MCA stroke. Has mild residual right-sided weakness. Continue full dose aspirin and statins. Dysphagia 3 diet, thin liquids. 3. H/o CAD (s/p CABG in 2004; s/p DES to prox RCA 2006), mild aortic stenosis. Stable. 4. Diabetes mellitus type 2. Stable. 5. History of atypical atrial flutter/not a candidate for anticoagulation secondary to falls. 6. BPH with bladder outflow obstruction. Continue Foley. Followup with urology as an outpatient. Continue Flomax. 7. Solitary pulmonary nodule/ left adrenal mass. Noted on previous admission. Given history of COPD and active tobacco use concern for lung malignancy. Recommended for outpatient PET scan which PCP was going to arrange as outpatient 8. Descending thoracic aortic aneurysm. Asymptomatic. Increase in size of aneurysm from 4.3 cm in 2012-5.7 cm on recent CT scan. Patient to follow with Dr. Trula Slade as outpatient. Also has 75% stenosis of the right carotid artery. 9. Moderate malnutrition. Glucerna 3 times a day  between meals  10. Equivocal urinalysis. Followup culture.   Overall improved. Change oral diltiazem to long-acting, follow BP. Increase oral metoprolo.   Transfer to telemetry  Possibly home in AM if HR stable.  Pending studies:   Urine culture  Code Status: full code DVT prophylaxis: SCDs Family Communication: none present Disposition Plan: home when improved  Murray Hodgkins, MD  [facility]  Pager [phone] If 7PM-7AM, please contact night-coverage at [email or web address], password [facility] 04/05/2013, 8:14 AM  LOS: 2 days   Consultants:  Cardiology  Procedures:    Antibiotics:    HPI/Subjective: No issues overnight. Aphasic but denies complaints by gesture.  Objective: Filed Vitals:   04/05/13 0500 04/05/13 0600 04/05/13 0700 04/05/13 0800  BP: 125/59 123/68 93/63 114/70  Pulse:   75   Temp:      TempSrc:      Resp: 24 24 26 19   Height:      Weight: 64.4 kg (141 lb 15.6 oz)     SpO2:   99%     Intake/Output Summary (Last 24 hours) at 04/05/13 0814 Last data filed at 04/05/13 0800  Gross per 24 hour  Intake   2130 ml  Output   2425 ml  Net   -295 ml     Filed Weights   04/03/13 1404 04/04/13 0500 04/05/13 0500  Weight: 68.04 kg (150 lb) 64.4 kg (141 lb 15.6 oz) 64.4 kg (141 lb 15.6 oz)    Exam:   Afebrile, VSS.  Gen. Appears calm and comfortable. Sitting in chair eating breakfast.  Respiratory CTA bilaterally, no w/r/r. Normal respiratory effort.  Cardiovascular irregular, tachycardic at times. No m/r/g. No LE edema.  Telemetry afib up to 120s.  Psychiatric. Appears  grossly normal. Limited by aphasia.  Data Reviewed:  Excellent UOP. Weight stable  Capillary blood sugars remain stable.  UC pending  Scheduled Meds: . antiseptic oral rinse  15 mL Mouth Rinse BID  . aspirin  325 mg Oral Daily  . atorvastatin  80 mg Oral q1800  . diltiazem  30 mg Oral 4 times per day  . feeding supplement (GLUCERNA SHAKE)  237 mL Oral TID BM  .  insulin aspart  0-15 Units Subcutaneous TID WC  . metoprolol succinate  50 mg Oral Daily  . potassium chloride SA  20 mEq Oral BID  . sodium chloride  3 mL Intravenous Q12H  . tamsulosin  0.4 mg Oral QPC supper  . [START ON 04/06/2013] Vitamin D (Ergocalciferol)  50,000 Units Oral Q7 days   Continuous Infusions:    Principal Problem:   Atrial fibrillation with rapid ventricular response Active Problems:   Coronary artery disease   Hypertension   Atypical atrial flutter   Diabetes mellitus   Tobacco abuse   Stroke   Solitary pulmonary nodule   Benign prostatic hypertrophy   COPD (chronic obstructive pulmonary disease)   Aphasia due to recent cerebrovascular accident   AAA (abdominal aortic aneurysm)   Malnutrition of moderate degree   Time spent 20 minutes

## 2013-04-06 DIAGNOSIS — I4891 Unspecified atrial fibrillation: Principal | ICD-10-CM

## 2013-04-06 DIAGNOSIS — T83511A Infection and inflammatory reaction due to indwelling urethral catheter, initial encounter: Secondary | ICD-10-CM

## 2013-04-06 DIAGNOSIS — N390 Urinary tract infection, site not specified: Secondary | ICD-10-CM | POA: Insufficient documentation

## 2013-04-06 LAB — GLUCOSE, CAPILLARY
Glucose-Capillary: 106 mg/dL — ABNORMAL HIGH (ref 70–99)
Glucose-Capillary: 187 mg/dL — ABNORMAL HIGH (ref 70–99)
Glucose-Capillary: 125 mg/dL — ABNORMAL HIGH (ref 70–99)
Glucose-Capillary: 115 mg/dL — ABNORMAL HIGH (ref 70–99)

## 2013-04-06 MED ORDER — DEXTROSE 5 % IV SOLN
1.0000 g | INTRAVENOUS | Status: DC
  Administered 2013-04-06 – 2013-04-09 (×4): 1 g via INTRAVENOUS
  Filled 2013-04-06 (×5): qty 10

## 2013-04-06 MED ORDER — METOPROLOL SUCCINATE ER 50 MG PO TB24
100.0000 mg | ORAL_TABLET | Freq: Every day | ORAL | Status: DC
  Administered 2013-04-06 – 2013-04-07 (×2): 100 mg via ORAL
  Filled 2013-04-06: qty 2

## 2013-04-06 NOTE — Progress Notes (Signed)
PROGRESS NOTE  [name] HCW:[id number] DOB: [birth year] DOA: 04/03/2013 PCP: Carolee Rota, NP Primary Cardiologist: Hochrein  Summary: 68 year old male with recent history of extensive left MCA stroke with some right-sided weakness and aphasia, (not a candidate for anticoagulation given frequent falls and noncompliance) paroxysmal A. Fib, was discharged home with home health on 3/20 after a long hospital stay was found by his home health today to have a pulse of 30 and brought to the hospital. Patient denied any symptoms. In the ED he was found to be in rapid A. fib with heart rate up to 170s. He was treated with IV diltiazem infusion with rate control and transitioned back to oral medications. Rates have remained somewhat tachycardic, beta blocker being adjusted, also on calcium channel blocker. Anticipate discharge home in rate control.  Assessment/Plan: 1. Afibrillation with rapid ventricular response. Likely secondary to noncompliance with metoprolol (caretaker reported she does not have this medication). Not a candidate for anticoagulation secondary to intracranial bleed. Rates with poor control overnight. Continue calcium channel blocker. Adjust beta blocker per cardiology. 2. Recent left MCA stroke. Has mild residual right-sided weakness. Continue full dose aspirin and statins. Dysphagia 3 diet, thin liquids. 3. H/o CAD (s/p CABG in 2004; s/p DES to prox RCA 2006), mild aortic stenosis. Stable. 4. Diabetes mellitus type 2. Remained stable 5. History of atypical atrial flutter/not a candidate for anticoagulation secondary to falls. 6. BPH with bladder outflow obstruction. Continue Foley. Followup with urology as an outpatient. Continue Flomax. 7. Solitary pulmonary nodule/ left adrenal mass. Noted on previous admission. Given history of COPD and active tobacco use concern for lung malignancy. Recommended for outpatient PET scan which PCP was going to arrange as outpatient 8. Descending  thoracic aortic aneurysm. Asymptomatic. Increase in size of aneurysm from 4.3 cm in 2012-5.7 cm on recent CT scan. Patient to follow with Dr. Trula Slade as outpatient. Also has 75% stenosis of the right carotid artery. 9. Moderate malnutrition. Glucerna 3 times a day between meals  10. UTI. Gram-negative rods.    Clinically stable but remains tachycardic. Adjust beta blocker per cardiology. Continue to monitor, anticipate discharge home in rate improved.  Start Rocephin for UTI. Followup culture.  Pending studies:   Urine culture gram-negative rods  Code Status: full code DVT prophylaxis: SCDs Family Communication: none present Disposition Plan: home when improved  Murray Hodgkins, MD  [facility]  Pager [phone] If 7PM-7AM, please contact night-coverage at [email or web address], password [facility] 04/06/2013, 10:37 AM  LOS: 3 days   Consultants:  Cardiology  Procedures:    Antibiotics:    HPI/Subjective: Tachycardic overnight. Seems to feel okay.  Objective: Filed Vitals:   04/06/13 0400 04/06/13 0500 04/06/13 0800 04/06/13 1006  BP: 126/70   133/68  Pulse:    112  Temp: 98.2 F (36.8 C)  98.3 F (36.8 C)   TempSrc: Oral  Oral   Resp: 29 26    Height:      Weight:  64.7 kg (142 lb 10.2 oz)    SpO2:        Intake/Output Summary (Last 24 hours) at 04/06/13 1037 Last data filed at 04/06/13 1010  Gross per 24 hour  Intake    483 ml  Output   1300 ml  Net   -817 ml     Filed Weights   04/04/13 0500 04/05/13 0500 04/06/13 0500  Weight: 64.4 kg (141 lb 15.6 oz) 64.4 kg (141 lb 15.6 oz) 64.7 kg (142 lb 10.2 oz)    Exam:  Afebrile, VSS.  Gen. Appears calm, comfortable.  Respiratory clear to auscultation bilaterally. No wheezes, rales rhonchi. Mild increased respiratory effort.  Cardiovascular tachycardic, irregular. No murmur, rub or gallop. No lower extremity edema.  Skin appears grossly unremarkable.  Data Reviewed:  Excellent UOP 1925. Weight stable  at 64 kg.  Capillary blood sugars stable.  UC pending  Scheduled Meds: . aspirin  325 mg Oral Daily  . atorvastatin  80 mg Oral q1800  . diltiazem  120 mg Oral Daily  . feeding supplement (GLUCERNA SHAKE)  237 mL Oral TID BM  . insulin aspart  0-15 Units Subcutaneous TID WC  . metoprolol succinate  100 mg Oral Daily  . potassium chloride SA  20 mEq Oral BID  . sodium chloride  3 mL Intravenous Q12H  . tamsulosin  0.4 mg Oral QPC supper  . Vitamin D (Ergocalciferol)  50,000 Units Oral Q7 days   Continuous Infusions:    Principal Problem:   Atrial fibrillation with rapid ventricular response Active Problems:   Coronary artery disease   Hypertension   Atypical atrial flutter   Diabetes mellitus   Tobacco abuse   Stroke   Solitary pulmonary nodule   Benign prostatic hypertrophy   COPD (chronic obstructive pulmonary disease)   Aphasia due to recent cerebrovascular accident   AAA (abdominal aortic aneurysm)   Malnutrition of moderate degree   Time spent 20 minutes

## 2013-04-06 NOTE — Care Management Utilization Note (Signed)
UR completed 

## 2013-04-06 NOTE — Progress Notes (Signed)
Patient ID: [name], male   DOB: 1945/11/15, 68 y.o.   MRN: [id number]     Subjective:   No complaints this AM. Elevated rates overnight.    Objective:   Temp:  [97.1 F (36.2 C)-98.3 F (36.8 C)] 98.3 F (36.8 C) (04/06 0800) Pulse Rate:  [48] 48 (04/05 1100) Resp:  [22-30] 26 (04/06 0500) BP: (95-126)/(59-80) 126/70 mmHg (04/06 0400) SpO2:  [99 %-100 %] 100 % (04/05 1100) Weight:  [142 lb 10.2 oz (64.7 kg)] 142 lb 10.2 oz (64.7 kg) (04/06 0500) Last BM Date: 04/05/13  Filed Weights   04/04/13 0500 04/05/13 0500 04/06/13 0500  Weight: 141 lb 15.6 oz (64.4 kg) 141 lb 15.6 oz (64.4 kg) 142 lb 10.2 oz (64.7 kg)    Intake/Output Summary (Last 24 hours) at 04/06/13 0923 Last data filed at 04/06/13 0844  Gross per 24 hour  Intake    480 ml  Output   1300 ml  Net   -820 ml    Telemetry: afib, rates 100-140s  Exam:  General:NAD  Resp: coarse expiration bilaterally  Cardiac: irreg, no m/r/g, no JVD,  WE:XHBZJIR soft, NT, ND  MSK: LEs are warm, no edema  Neuro: no focal deficits   Lab Results:  Basic Metabolic Panel:  Recent Labs Lab 04/03/13 1415  NA 135*  K 4.4  CL 97  CO2 23  GLUCOSE 142*  BUN 17  CREATININE 1.02  CALCIUM 9.7  MG 2.1    Liver Function Tests: No results found for this basename: AST, ALT, ALKPHOS, BILITOT, PROT, ALBUMIN,  in the last 168 hours  CBC:  Recent Labs Lab 04/03/13 1415  WBC 8.8  HGB 15.0  HCT 44.7  MCV 81.3  PLT 256    Cardiac Enzymes:  Recent Labs Lab 04/03/13 1415  TROPONINI <0.30    BNP:  Recent Labs  06/05/12 0459 11/01/12 0056 11/04/12 2131  PROBNP 4068.0* 7093.0* 6884.0*    Coagulation: No results found for this basename: INR,  in the last 168 hours  ECG:   Medications:   Scheduled Medications: . aspirin  325 mg Oral Daily  . atorvastatin  80 mg Oral q1800  . diltiazem  120 mg Oral Daily  . feeding supplement (GLUCERNA SHAKE)  237 mL Oral TID BM  . insulin aspart  0-15  Units Subcutaneous TID WC  . metoprolol succinate  75 mg Oral Daily  . potassium chloride SA  20 mEq Oral BID  . sodium chloride  3 mL Intravenous Q12H  . tamsulosin  0.4 mg Oral QPC supper  . Vitamin D (Ergocalciferol)  50,000 Units Oral Q7 days     Infusions:     PRN Medications:  acetaminophen, acetaminophen, ALPRAZolam, nitroGLYCERIN, ondansetron (ZOFRAN) IV, ondansetron     Assessment/Plan    1. Afib - initially managed with IV dilt, now on oral dilt and Toprol. - deemed previously per notes to be a poor candidate for anticoag due to falls and non-compliance, recent intracranial bleed 03/2013 after CVA. Continued on ASA - elevated rates overnight, will increae Toprol XL to 100mg  daily  2. CAD - no current symptoms, no evidence of ischemia at this time - continue secondary prevention  3. Descending thoracic aneurysm - 4.3 cm in diameter on the prior examination from 08/25/2010 to 5.7 cm in diameter on 03/12/13.  - per notes he is established with vascular surgery for follow up, Dr Trula Slade  4. Lung mass - awaiting outpt PET scan  5. CVA -  recent left MCA stroke per notes    Carlyle Dolly, M.D., F.A.C.C.

## 2013-04-07 ENCOUNTER — Ambulatory Visit (HOSPITAL_COMMUNITY): Payer: PRIVATE HEALTH INSURANCE

## 2013-04-07 DIAGNOSIS — E279 Disorder of adrenal gland, unspecified: Secondary | ICD-10-CM

## 2013-04-07 DIAGNOSIS — J449 Chronic obstructive pulmonary disease, unspecified: Secondary | ICD-10-CM

## 2013-04-07 LAB — GLUCOSE, CAPILLARY
GLUCOSE-CAPILLARY: 95 mg/dL (ref 70–99)
Glucose-Capillary: 110 mg/dL — ABNORMAL HIGH (ref 70–99)
Glucose-Capillary: 204 mg/dL — ABNORMAL HIGH (ref 70–99)
Glucose-Capillary: 121 mg/dL — ABNORMAL HIGH (ref 70–99)

## 2013-04-07 LAB — URINE CULTURE: Colony Count: >=100000

## 2013-04-07 LAB — BASIC METABOLIC PANEL
BUN: 18 mg/dL (ref 6–23)
CO2: 23 mEq/L (ref 19–32)
CREATININE: 0.87 mg/dL (ref 0.50–1.35)
Calcium: 9.1 mg/dL (ref 8.4–10.5)
Chloride: 101 mEq/L (ref 96–112)
GFR, EST NON AFRICAN AMERICAN: 86 mL/min — AB (ref >90)
Glucose, Bld: 122 mg/dL — ABNORMAL HIGH (ref 70–99)
Potassium: 4.1 mEq/L (ref 3.7–5.3)
Sodium: 137 mEq/L (ref 137–147)

## 2013-04-07 MED ORDER — METOPROLOL SUCCINATE ER 50 MG PO TB24
125.0000 mg | ORAL_TABLET | Freq: Every day | ORAL | Status: DC
  Administered 2013-04-08 – 2013-04-09 (×2): 125 mg via ORAL
  Filled 2013-04-07 (×2): qty 1
  Filled 2013-04-07 (×3): qty 2
  Filled 2013-04-07: qty 1

## 2013-04-07 NOTE — Progress Notes (Addendum)
[facility] PROGRESS NOTE  ARSHAD OBERHOLZER TGG:[id number] DOB: 1945-03-25 DOA: 04/03/2013 PCP: Carolee Rota, NP  Assessment/Plan  1. Afibrillation with rapid ventricular response. Likely secondary to noncompliance with metoprolol (caretaker reported she does not have this medication). Not a candidate for anticoagulation secondary to intracranial bleed. Rates still poor control overnight.  -  Defer to cardiology.  -  Metoprolol at 100mg  -  Consider increasing dilt but blood pressure intermittently low 2. Recent left MCA stroke. Has mild residual right-sided weakness. Continue full dose aspirin and statins. Dysphagia 3 diet, thin liquids. 3. H/o CAD (s/p CABG in 2004; s/p DES to prox RCA 2006), mild aortic stenosis. Stable. 4. Diabetes mellitus type 2. Remained stable 5. History of atypical atrial flutter/not a candidate for anticoagulation secondary to falls. 6. BPH with bladder outflow obstruction. Continue Foley. Followup with urology as an outpatient. Continue Flomax. 7. Solitary pulmonary nodule/ left adrenal mass. Noted on previous admission. Given history of COPD and active tobacco use concern for lung malignancy. Recommended for outpatient PET scan which PCP was going to arrange as outpatient 8. Descending thoracic aortic aneurysm. Asymptomatic. Increase in size of aneurysm from 4.3 cm in 2012-5.7 cm on recent CT scan. Patient to follow with Dr. Trula Slade as outpatient. Also has 75% stenosis of the right carotid artery. 9. Moderate malnutrition. Glucerna 3 times a day between meals  10. UTI. Gram-negative rods.   -  Continue ceftriaxone  -  Follow up urine culture  Diet:  Dysphagia 3 Access:  PIV IVF:  Off Proph:  SCDs  Code Status:  DNR Family Communication: Patient and niece.  Niece is obtaining HPOA.  Discussed code status today.   Disposition Plan: Pending improvement in his heart rate.    Consultants:  Cardiology  Procedures:  None  Antibiotics:  Ceftriaxone  from 4/6   HPI/Subjective:  Patient states that he did not feel well, however he is not able to specify exactly in what way. He may have some mild abdominal discomfort from urinary tract infection, however he denies shortness of breath, chest pain, nausea, vomiting, diarrhea, constipation.    Objective: Filed Vitals:   04/06/13 1500 04/06/13 1600 04/06/13 1700 04/06/13 2119  BP: 99/61 119/67 122/79 118/78  Pulse:  80  83  Temp:  97.5 F (36.4 C)  97.8 F (36.6 C)  TempSrc:  Oral  Axillary  Resp: 29 28 27 20   Height:      Weight:      SpO2:    98%    Intake/Output Summary (Last 24 hours) at 04/07/13 1013 Last data filed at 04/07/13 0511  Gross per 24 hour  Intake    530 ml  Output   1855 ml  Net  -1325 ml   Filed Weights   04/04/13 0500 04/05/13 0500 04/06/13 0500  Weight: 64.4 kg (141 lb 15.6 oz) 64.4 kg (141 lb 15.6 oz) 64.7 kg (142 lb 10.2 oz)    Exam:   General:  Caucasian male, No acute distress  HEENT:  NCAT, MMM  Cardiovascular:  Irregular rate and rhythm, 3/6 harsh systolic murmur heard every second to third beat, 2+ pulses, warm extremities  Respiratory:  CTAB, no increased WOB  Abdomen:   NABS, soft, mildly distended and gassy, nontender  MSK:   Normal tone and bulk, no LEE  Neuro:  Grossly intact, aphasic but able to follow commands  Data Reviewed: Basic Metabolic Panel:  Recent Labs Lab 04/03/13 1415 04/07/13 0501  NA 135* 137  K 4.4 4.1  CL 97 101  CO2 23 23  GLUCOSE 142* 122*  BUN 17 18  CREATININE 1.02 0.87  CALCIUM 9.7 9.1  MG 2.1  --    Liver Function Tests: No results found for this basename: AST, ALT, ALKPHOS, BILITOT, PROT, ALBUMIN,  in the last 168 hours No results found for this basename: LIPASE, AMYLASE,  in the last 168 hours No results found for this basename: AMMONIA,  in the last 168 hours CBC:  Recent Labs Lab 04/03/13 1415  WBC 8.8  NEUTROABS 5.8  HGB 15.0  HCT 44.7  MCV 81.3  PLT 256   Cardiac  Enzymes:  Recent Labs Lab 04/03/13 1415  TROPONINI <0.30   BNP (last 3 results)  Recent Labs  06/05/12 0459 11/01/12 0056 11/04/12 2131  PROBNP 4068.0* 7093.0* 6884.0*   CBG:  Recent Labs Lab 04/06/13 0744 04/06/13 1132 04/06/13 1626 04/06/13 2118 04/07/13 0756  GLUCAP 106* 187* 125* 115* 110*    Recent Results (from the past 240 hour(s))  URINE CULTURE     Status: None   Collection Time    04/03/13  3:55 PM      Result Value Ref Range Status   Specimen Description URINE, CLEAN CATCH   Final   Special Requests NONE   Final   Culture  Setup Time     Final   Value: 04/04/2013 00:15     Performed at [facility]     Final   Value: >=100,000 COLONIES/ML     Performed at [facility]   Culture     Final   Value: [place]     Performed at [facility]   Report Status PENDING   Incomplete     Studies: No results found.  Scheduled Meds: . aspirin  325 mg Oral Daily  . atorvastatin  80 mg Oral q1800  . cefTRIAXone (ROCEPHIN)  IV  1 g Intravenous Q24H  . diltiazem  120 mg Oral Daily  . feeding supplement (GLUCERNA SHAKE)  237 mL Oral TID BM  . insulin aspart  0-15 Units Subcutaneous TID WC  . metoprolol succinate  100 mg Oral Daily  . potassium chloride SA  20 mEq Oral BID  . sodium chloride  3 mL Intravenous Q12H  . tamsulosin  0.4 mg Oral QPC supper  . 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 If 7PM-7AM, please contact night-coverage at [email or web address], password [facility] 04/07/2013, 10:13 AM  LOS: 4 days

## 2013-04-07 NOTE — Progress Notes (Signed)
    Primary cardiologist: Minus Breeding MD   Subjective:    Voices no complaints.  Objective:   Temp:  [97.5 F (36.4 C)-97.8 F (36.6 C)] 97.8 F (36.6 C) (04/06 2119) Pulse Rate:  [80-97] 83 (04/06 2119) Resp:  [20-31] 20 (04/06 2119) BP: (99-128)/(61-79) 118/78 mmHg (04/06 2119) SpO2:  [98 %] 98 % (04/06 2119) Last BM Date: 04/06/13  Filed Weights   04/04/13 0500 04/05/13 0500 04/06/13 0500  Weight: 141 lb 15.6 oz (64.4 kg) 141 lb 15.6 oz (64.4 kg) 142 lb 10.2 oz (64.7 kg)    Intake/Output Summary (Last 24 hours) at 04/07/13 1103 Last data filed at 04/07/13 0900  Gross per 24 hour  Intake    770 ml  Output   1855 ml  Net  -1085 ml    Telemetry: Atrial fibrillation/flutter with heart rate 100-110.  Exam:  General: Chronically ill-appearing, no obvious distress.  Lungs: Decreased breath sounds, nonlabored.  Cardiac: Irregularly irregular.  Extremities: No pitting.   Lab Results:  Basic Metabolic Panel:  Recent Labs Lab 04/03/13 1415 04/07/13 0501  NA 135* 137  K 4.4 4.1  CL 97 101  CO2 23 23  GLUCOSE 142* 122*  BUN 17 18  CREATININE 1.02 0.87  CALCIUM 9.7 9.1  MG 2.1  --     CBC:  Recent Labs Lab 04/03/13 1415  WBC 8.8  HGB 15.0  HCT 44.7  MCV 81.3  PLT 256    Cardiac Enzymes:  Recent Labs Lab 04/03/13 1415  TROPONINI <0.30     Medications:   Scheduled Medications: . aspirin  325 mg Oral Daily  . atorvastatin  80 mg Oral q1800  . cefTRIAXone (ROCEPHIN)  IV  1 g Intravenous Q24H  . diltiazem  120 mg Oral Daily  . feeding supplement (GLUCERNA SHAKE)  237 mL Oral TID BM  . insulin aspart  0-15 Units Subcutaneous TID WC  . metoprolol succinate  100 mg Oral Daily  . potassium chloride SA  20 mEq Oral BID  . sodium chloride  3 mL Intravenous Q12H  . tamsulosin  0.4 mg Oral QPC supper  . Vitamin D (Ergocalciferol)  50,000 Units Oral Q7 days      PRN Medications:  acetaminophen, acetaminophen, ALPRAZolam,  nitroGLYCERIN, ondansetron (ZOFRAN) IV, ondansetron   Assessment:   1. Atrial fibrillation/flutter. Management strategy has been rate control, no anticoagulation with history of previous intracranial bleed, noncompliance, and falls. Toprol-XL increased yesterday.  2. Recent stroke, left hemispheric, evident by head CT with evidence of prior stroke as well.   3. Descending thoracic aortic aneurysm, 5.7 cm. Notes indicate anticipated followup with Dr. Trula Slade.  4. Multivessel CAD status post CABG in 2004 with DES the proximal RCA in 2006. LVEF 50-55% by recent echocardiogram.   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   6. Left apical lung nodule, described as irregular-shaped and enlarging by chest CT, concerning for malignancy. PET scan pending.  7. Moderate aortic stenosis.   Plan/Discussion:    Heart rate was better controlled during hospitalization in March at which time Toprol-XL dose was 125 mg daily. This will be changed. Otherwise continue Cardizem CD and aspirin. Complex patient with multiple comorbidities and overall poor prognosis.   Satira Sark, M.D., F.A.C.C.

## 2013-04-07 NOTE — Care Management Note (Addendum)
    Page 1 of 2   04/08/2013     1:43:00 PM   CARE MANAGEMENT NOTE 04/08/2013  Patient:  ROBB, SIBAL   Account Number:  [id number]  Date Initiated:  04/07/2013  Documentation initiated by:  Claretha Cooper  Subjective/Objective Assessment:   Pt admitted from home where he lives with friends that assist him. Active with [facility] and resume services     Action/Plan:   Anticipated DC Date:  04/09/2013   Anticipated DC Plan:  [facility]  CM consult      [facility] Choice  Resumption Of Svcs/PTA Provider   Choice offered to / List presented to:  C-1 Patient        [place] arranged  HH-1 RN  [place]      [place].   Status of service:  Completed, signed off Medicare Important Message given?   (If response is "NO", the following Medicare IM given date fields will be blank) Date Medicare IM given:   Date Additional Medicare IM given:    Discharge Disposition:    Per UR Regulation:    If discussed at Long Length of Stay Meetings, dates discussed:    Comments:  04/07/13 Claretha Cooper RN BSN CM  04/08/13 Navin Dogan RN BNS CM Pt evaluated by PT and SNF recommended. CSW will assist in placing pt for rehab.

## 2013-04-07 NOTE — Clinical Social Work Note (Signed)
CSW received referral for advance directives. Met with pt at bedside. Pt alert to self and place, but was not sure what date, year, or season we were in. Pt unable to complete advance directives at this time. CSW will sign off, but can be reconsulted if needed.  [name], [place]

## 2013-04-07 NOTE — Plan of Care (Signed)
Problem: Phase II Progression Outcomes Goal: Progress activity as tolerated unless otherwise ordered Outcome: Progressing Plan to d/c home with home health

## 2013-04-08 DIAGNOSIS — I714 Abdominal aortic aneurysm, without rupture, unspecified: Secondary | ICD-10-CM

## 2013-04-08 LAB — GLUCOSE, CAPILLARY
GLUCOSE-CAPILLARY: 157 mg/dL — AB (ref 70–99)
Glucose-Capillary: 102 mg/dL — ABNORMAL HIGH (ref 70–99)
Glucose-Capillary: 131 mg/dL — ABNORMAL HIGH (ref 70–99)
Glucose-Capillary: 159 mg/dL — ABNORMAL HIGH (ref 70–99)

## 2013-04-08 MED ORDER — GLUCERNA SHAKE PO LIQD: 237.0000 mL | Freq: Three times a day (TID) | ORAL | Status: DC

## 2013-04-08 MED ORDER — DILTIAZEM HCL ER COATED BEADS 120 MG PO CP24: 120.0000 mg | ORAL_CAPSULE | Freq: Every day | ORAL | Status: DC

## 2013-04-08 MED ORDER — ALPRAZOLAM 1 MG PO TABS: 1.0000 mg | ORAL_TABLET | Freq: Four times a day (QID) | ORAL | Status: DC

## 2013-04-08 MED ORDER — CIPROFLOXACIN HCL 500 MG PO TABS: 500.0000 mg | ORAL_TABLET | Freq: Two times a day (BID) | ORAL | Status: AC

## 2013-04-08 MED ORDER — METOPROLOL SUCCINATE ER 25 MG PO TB24: 125.0000 mg | ORAL_TABLET | Freq: Every day | ORAL | Status: AC

## 2013-04-08 NOTE — Discharge Summary (Addendum)
Physician Discharge Summary  ARTIN BOUCH [id number] DOB: 26-Dec-1945 DOA: 04/03/2013  PCP: Carolee Rota, NP  Admit date: 04/03/2013 Discharge date: 04/08/2013  Recommendations for Outpatient Follow-up:  1. Transfer to skilled nursing facility for ongoing PT/OT/SLP  2. Primary care doctor within one to 2 weeks for consideration of PET scan and possible biopsy.  Discussion of goals of care please. 3. Cardiology within 1 month for blood pressure and heart rate check 4. Urology for management of indwelling catheter 5. Vascular surgery, Dr. Trula Slade within 1 month for f/u aneurysm 6. Continue ciprofloxacin through April 19, then stop  Discharge Diagnoses:  Principal Problem:   Atrial fibrillation with rapid ventricular response Active Problems:   Coronary artery disease   Hypertension   Atypical atrial flutter   Diabetes mellitus   Tobacco abuse   Stroke   Solitary pulmonary nodule   Benign prostatic hypertrophy   COPD (chronic obstructive pulmonary disease)   Aphasia due to recent cerebrovascular accident   AAA (abdominal aortic aneurysm)   Moderate protein-calorie malnutrition   Catheter-associated urinary tract infection   Discharge Condition: Stable, improved   Diet recommendation: Dysphasia 3 with thin liquids  Wt Readings from Last 3 Encounters:  04/06/13 64.7 kg (142 lb 10.2 oz)  03/16/13 76 kg (167 lb 8.8 oz)  03/16/13 76 kg (167 lb 8.8 oz)    History of present illness:  68 year old male with recent history of extensive left MCA stroke with some right-sided weakness and aphasia, (not a candidate for anticoagulation given frequent falls and noncompliance) paroxysmal A. Fib, diabetes mellitus, left-sided lung nodule (possibly cancerous), right adrenal mass, increasing size of descending thoracic aortic aneurysm to 5.7 cm, extensive CAD with CABG in 2004 and proximal RCA in 2006 was discharged home with home health on 3/20 after a long hospital stay was found by his  home health today to have a pulse of 30 and brought to the hospital. Patient denied any symptoms. In the ED he was found to be in rapid A. fib with heart rate up to 170s. Remaining vitals were stable. Because of the face the patient is not able to provide further details. Patient was started on Cardizem drip and his heart rate improved. [facility] hospitalist consulted for admission to step down. Cardiology consulted as well. Patient is unable to give a good history but is able to answer in yes and no and follow commands well.  Patient denies headache, dizziness, fever, chills, nausea , vomiting, chest pain, palpitations, SOB, abdominal pain, bowel or urinary symptoms. No sick contacts or any illness following recent discharge. No change in appetite. Reportedly taking his medications regularly.  Hospital Course:   Atrial fibrillation with rapid ventricular response. The patient was initially admitted to the step down unit and started on a diltiazem drip. His heart rate gradually trended down to the 110s. He was transitioned to metoprolol and oral diltiazem. During that transitioned from IV to oral medications, he had episodes of tachycardia to the 120s which were sustained for several hours at a time. Gradually his metoprolol dose was increased to 125 mg daily under the guidance of cardiology. His heart rate has been rate controlled for the last 24 hours with rate approximately 90 beats per minute, and no episodes of severe tachycardia. He should follow up with cardiology in a few weeks to repeat his EKG and check his heart rate and make sure that his blood pressure is stable on his current diltiazem dose. Troponins were negative. Echocardiogram had been done one  month ago and therefore was not repeated. At that time he had an ejection fraction of 50-55% and grade 2 diastolic heart failure.  He is not a candidate for anticoagulation secondary to recurrent falls.  Recent left MCA stroke with residual right-sided  weakness. He continued full dose aspirin and statin. For his dysphasia related to his stroke, he has been tolerating a dysphagia 3 diet with thin liquids.  Coronary artery disease status post CABG in 2004 and drug-eluting stent proximal RCA in 2006, stable. He is on aspirin, statin, beta blocker.  Chronic diastolic heart failure, grade 2. Stable. Continue daily weights, and if the patient gains more than 3 pounds in one day or 5 pounds in one week, call cardiology office.  Diabetes mellitus type 2, remained stable during admission. He may resume his previous medications.  BPH with bladder outflow obstruction.  Continued chronic Foley catheter. Followup with urologist as an outpatient. He continued Flomax.  Solitary pulmonary nodule which is concerning for malignancy and left adrenal mass. He has a history of smoking. Consider outpatient PET scan and further evaluation per primary care doctor, if felt to be indicated. After long discussion with the niece regarding the patient's comorbidities, the family is considering some comfort measures and if the patient is not deemed a good candidate for chemotherapy, they may want to consider hospice care.  Descending thoracic aortic aneurysm, asymptomatic, however it has increased in size from 4.3 cm in 2012-5.7 cm on his most recent scan. He is followed by vascular surgery, Doctor Trula Slade, as an outpatient and should follow up with him within the next few weeks. 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  The patient and his family are aware that there is increased risk spontaneous rupture and bleeding from his aneurysm given its increased size in that he could potentially die suddenly and unexpectedly.  Moderate protein calorie nonnutritional, seen by a nutritionist. He is on a liberalized diet and supplements.  CAUTI urinary tract infection with Serratia.  Catheter was present at admission, however, UTI developed during hopsitalization.   Bacteria is resistant to cefazolin, nitrofurantoin, however it is sensitive to other antibiotics. We will give him a prescription for to complete a full course for urinary tract infection.  Consultants:  Cardiology Procedures:  None Antibiotics:  Ceftriaxone from 4/6 > 4/8   Discharge Exam: Filed Vitals:   04/08/13 0411  BP: 116/82  Pulse: 79  Temp: 98.1 F (36.7 C)  Resp: 20   Filed Vitals:   04/06/13 2119 04/07/13 1512 04/07/13 2149 04/08/13 0411  BP: 118/78 109/71 125/73 116/82  Pulse: 83 76 50 79  Temp: 97.8 F (36.6 C) 97.7 F (36.5 C) 97.6 F (36.4 C) 98.1 F (36.7 C)  TempSrc: Axillary  Oral Oral  Resp: 20 20 20 20   Height:      Weight:      SpO2: 98% 98% 99% 98%    General: Caucasian male, No acute distress  HEENT: NCAT, MMM  Cardiovascular: Irregular rate and rhythm, 3/6 harsh systolic murmur, 2+ pulses, warm extremities  Respiratory: CTAB, no increased WOB  Abdomen: NABS, soft, mildly distended and gassy, nontender  MSK: Normal tone and bulk, no LEE  Neuro: Grossly intact, aphasic but able to follow commands   Discharge Instructions      Discharge Orders   Future Appointments Provider Department Dept Phone   04/15/2013 3:30 PM Minus Breeding, [place] [phone]   04/20/2013 10:00 AM Serafina Mitchell, MD  [facility] [place] [phone]   Future Orders Complete By Expires   ([place]) Call MD:  Anytime you have any of the following symptoms: 1) 3 pound weight gain in 24 hours or 5 pounds in 1 week 2) shortness of breath, with or without a dry hacking cough 3) swelling in the hands, feet or stomach 4) if you have to sleep on extra pillows at night in order to breathe.  As directed    Call MD for:  difficulty breathing, headache or visual disturbances  As directed    Call MD for:  extreme fatigue  As directed    Call MD for:  hives  As directed    Call MD for:  persistant dizziness or light-headedness   As directed    Call MD for:  persistant nausea and vomiting  As directed    Call MD for:  severe uncontrolled pain  As directed    Call MD for:  temperature >100.4  As directed    Discharge instructions  As directed    Increase activity slowly  As directed        Medication List         ALPRAZolam 1 MG tablet  Commonly known as:  XANAX  Take 1 tablet (1 mg total) by mouth 4 (four) times daily.     aspirin 325 MG tablet  Take 1 tablet (325 mg total) by mouth daily.     ciprofloxacin 500 MG tablet  Commonly known as:  CIPRO  Take 1 tablet (500 mg total) by mouth 2 (two) times daily.     diltiazem 120 MG 24 hr capsule  Commonly known as:  CARDIZEM CD  Take 1 capsule (120 mg total) by mouth daily.     feeding supplement (GLUCERNA SHAKE) Liqd  Take 237 mLs by mouth 3 (three) times daily between meals.     metFORMIN 500 MG tablet  Commonly known as:  GLUCOPHAGE  Take 500 mg by mouth 2 (two) times daily with a meal.     metoprolol succinate 25 MG 24 hr tablet  Commonly known as:  TOPROL-XL  Take 5 tablets (125 mg total) by mouth daily. Take with or immediately following a meal.     nitroGLYCERIN 0.4 MG SL tablet  Commonly known as:  NITROSTAT  Place 0.4 mg under the tongue every 5 (five) minutes as needed for chest pain.     ondansetron 8 MG tablet  Commonly known as:  ZOFRAN  Take by mouth every 8 (eight) hours as needed for nausea or vomiting.     potassium chloride SA 20 MEQ tablet  Commonly known as:  K-DUR,KLOR-CON  Take 1 tablet (20 mEq total) by mouth 2 (two) times daily.     rosuvastatin 40 MG tablet  Commonly known as:  CRESTOR  Take 40 mg by mouth daily.     tamsulosin 0.4 MG Caps capsule  Commonly known as:  FLOMAX  Take 1 capsule (0.4 mg total) by mouth daily after supper.     Vitamin D (Ergocalciferol) 50000 UNITS Caps capsule  Commonly known as:  DRISDOL  Take 50,000 Units by mouth every 7 (seven) days.       Follow-up Information   Follow up  with DANIEL,JANICE, NP. Schedule an appointment as soon as possible for a visit in 2 weeks.   Specialty:  Nurse Practitioner   Contact information:   [address] [place] [address] [phone]  Follow up with Minus Breeding, MD. Schedule an appointment as soon as possible for a visit in 1 month.   Specialty:  Cardiology   Contact information:   [address] [place] [address] [phone]       Follow up with Beaulah Dinning I, MD. Schedule an appointment as soon as possible for a visit in 1 month.   Specialty:  Urology   Contact information:   [address] [place] [address] [phone]       Follow up with Ramond Craver, [place], MD. Schedule an appointment as soon as possible for a visit in 1 month.   Specialty:  Vascular Surgery   Contact information:   [address] [place] [address] [phone]        The results of significant diagnostics from this hospitalization (including imaging, microbiology, ancillary and laboratory) are listed below for reference.    Significant Diagnostic Studies: Dg Chest 1 View  03/11/2013   CLINICAL DATA Right extremity weakness.  EXAM CHEST - 1 VIEW  COMPARISON November 04, 2012.  FINDINGS Stable cardiomegaly. Status post coronary artery bypass graft. Old right rib fractures are noted. No pneumothorax or pleural effusion is noted. No acute pulmonary disease is noted.  IMPRESSION No acute cardiopulmonary abnormality seen.  SIGNATURE  Electronically Signed   By: Sabino Dick M.D.   On: 03/11/2013 02:44   Ct Head Wo Contrast  03/11/2013   CLINICAL DATA Right arm weakness, expressive aphasia  EXAM CT HEAD WITHOUT CONTRAST  TECHNIQUE Contiguous axial images were obtained from the base of the skull through the vertex without intravenous contrast.  COMPARISON 06/02/2012  FINDINGS Subtle left basal ganglia hypodensity and loss of left insular ribbon series 2, image 18. Right coronal radiata hypodensity is  similar and may reflect sequelae of a prior lacunar infarction. Multiple other scattered areas of subcortical and periventricular white matter hypodensity may reflect chronic microangiopathic change. No intraparenchymal hemorrhage, mass, mass effect, or abnormal extra-axial fluid collection. No hydrocephalus. Atherosclerotic vascular calcifications. Paranasal sinuses and mastoid air cells are predominantly clear.  IMPRESSION Left basal ganglia hypodensity and loss of left insular ribbon may reflect an acute infarction.  Right corona radiata remote lacunar infarction and multifocal white matter hypodensities, similar to prior.  Critical Value/emergent results were called by telephone at the time of interpretation on 03/11/2013 at 1:59 AM to Dr. Jola Schmidt , who verbally acknowledged these results.  SIGNATURE  Electronically Signed   By: Carlos Levering M.D.   On: 03/11/2013 02:02   Ct Angio Neck W/cm &/or Wo/cm  03/12/2013   CLINICAL DATA:  Abnormal carotid Doppler study with extensive irregular atherosclerotic plaque bilaterally but no definite hemodynamically significant stenosis. Stroke.  EXAM: CT ANGIOGRAPHY NECK  TECHNIQUE: Multidetector CT imaging of the neck was performed using the standard protocol during bolus administration of intravenous contrast. Multiplanar CT image reconstructions and MIPs were obtained to evaluate the vascular anatomy. Carotid stenosis measurements (when applicable) are obtained utilizing NASCET criteria, using the distal internal carotid diameter as the denominator.  CONTRAST:  55mL OMNIPAQUE IOHEXOL 350 MG/ML SOLN  COMPARISON:  US CAROTID DUPLEX BILAT dated 03/11/2013; CT HEAD W/O CM dated 03/11/2013; CT C SPINE W/O CM dated 06/02/2012; CT CHEST W/CM dated 08/25/2010  FINDINGS: There is a common origin of the left common carotid artery and the innominate artery. Dense calcific plaque is present in the proximal left subclavian artery, narrowing the lumen to 2.9 mm. The more distal  left subclavian artery measures 8.8  mm.  The vertebral arteries originate from the subclavian arteries bilaterally. Calcifications are present adjacent to the vertebral artery ostia without significant stenosis. There are scattered calcifications throughout cervical vertebral arteries. The lumen is narrowed to 1.3 mm on the right at C5. This compares with the more normal distal vessel measuring 3.4 mm at the level of C2. Calcifications are present in the left vertebral artery without significant canal compromise.  Calcifications are present along the right common carotid artery. There is a dense calcification of the level of C5 which narrows the lumen the 3.1 mm. This compares with the more distal common carotid measurement of 7.6 mm proximal to the bifurcation.  A high-grade calcific stenosis is present at the carotid bifurcation. The lumen is narrowed to 1.5 mm and this compares with more distal luminal measurements of 6.1 mm.  Atherosclerotic calcifications are noted along the left common carotid artery without a significant stenosis. There are additional calcifications at the left carotid bifurcation without a significant stenosis relative to the distal vessel.  Atherosclerotic calcifications are present within the cavernous carotid arteries bilaterally without definite stenosis. Limited imaging of the brain is unremarkable.  The soft tissues of the neck are otherwise unremarkable. A nodular density in the medial left upper lobe demonstrates interval increase in size since 2012. The lesion now measures 25 x 22 x 20 mm. No other focal nodule, mass, or airspace disease is present. There is no significant adenopathy.  Review of the MIP images confirms the above findings.  IMPRESSION: 1. Extensive calcified atherosclerotic changes throughout the neck. 2. 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 3. 60% stenosis of the right vertebral artery at the level of C5.  Scattered calcifications are present throughout the vertebral arteries without other significant stenoses. 4. 60% stenosis of the mid right common carotid artery at the level of C5. 5. 75% stenosis of the proximal right internal carotid artery with dense atherosclerotic calcifications. 6. Enlarging 2.5 cm left upper lobe lung nodule. Recommend CT the chest with contrast for further evaluation. PET scanning may be useful as well depending on findings from the remainder of the chest. These results will be called to the ordering clinician or representative by the Radiologist Assistant, and communication documented in the PACS Dashboard.   Electronically Signed   By: Lawrence Santiago M.D.   On: 03/12/2013 10:39   Ct Chest W Contrast  03/13/2013   CLINICAL DATA:  Left upper lobe nodule identified on recent neck CT.  EXAM: CT CHEST WITH CONTRAST  TECHNIQUE: Multidetector CT imaging of the chest was performed during intravenous contrast administration.  CONTRAST:  34mL OMNIPAQUE IOHEXOL 300 MG/ML  SOLN  COMPARISON:  CT of the neck 03/12/2013.  FINDINGS: Mediastinum: Heart size is mildly enlarged. There is no significant pericardial fluid, thickening or pericardial calcification. There is atherosclerosis of the thoracic aorta, the great vessels of the mediastinum and the coronary arteries, including calcified atherosclerotic plaque in the left main, left anterior descending, left circumflex and right coronary arteries. Status post median sternotomy for CABG, including LIMA to the LAD. In addition, there is aneurysmal dilatation of the descending thoracic aorta which measures up to 5.7 cm in diameter (significantly increased compared to prior study 08/25/2010, at which point it measured only 4.3 cm in diameter). No pathologically enlarged mediastinal or hilar lymph nodes. Esophagus is unremarkable in appearance.  Lungs/Pleura: In the medial aspect of the apex of the left upper lobe there is an enlarging  irregular-shaped  masslike opacity that measures up to 4.2 x 2.2 cm (images 7 and 8 of series 3). There is some surrounding architectural distortion and this lesion makes contact with the overlying pleura both medially and anteriorly, where there is some mild pleural retraction. Probable scarring throughout the left lung base. Moderate diffuse bronchial wall thickening. Mild centrilobular emphysema. Small left pleural effusion layering dependently.  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 Diffuse adreniform thickening of the left adrenal gland is similar to the prior examination, although there is a new nodular component of this laterally which measures approximately 1.5 x 2.2 cm (image 55 of series 2), which is indeterminate.  Musculoskeletal: Old T12 compression fracture with approximately 40% loss of anterior vertebral body height appear slightly increased compared to remote prior chest radiograph 09/13/2006. In addition, there is a new compression fracture of T8 with approximately 25% loss of anterior vertebral body height, which does not appear to be acute. There are no aggressive appearing lytic or blastic lesions noted in the visualized portions of the skeleton. Multiple old healed right-sided rib fractures. Median sternotomy wires.  IMPRESSION: 1. Interval growth of an irregular-shaped mass like opacity in the medial aspect of the left apex, which currently measures up to 4.2 x 2.2 cm and is highly concerning for a slow-growing neoplasm such as an adenocarcinoma. Correlation with PET-CT and/or biopsy is strongly recommended to exclude neoplasm at this time. 2. Fatty adreniform enlargement of the left adrenal gland is similar to the prior study, although there is a new soft tissue nodule extending off the lateral aspect of the gland which measures 2.2 x 1.5 cm. This is indeterminate on today's examination, and the possibility of a  metastatic lesion or primary adrenal lesion warrants consideration. This could be definitively characterized with MRI of the abdomen or noncontrast CT of the abdomen if clinically indicated. 3. Atherosclerosis, including left main and 3 vessel coronary artery disease. In addition, there is increasing aneurysmal dilatation of the descending thoracic aorta and visualized portions of the abdominal aorta, as above. In particular, the descending thoracic aorta has increased from 4.3 cm in diameter on the prior examination from 08/25/2010 to 5.7 cm in diameter on today's examination. Patient is status post median sternotomy for CABG. 4. Diffuse bronchial wall thickening with mild centrilobular emphysema ; imaging findings suggestive of underlying COPD. 5. Additional incidental findings, as above. These results will be called to the ordering clinician or representative by the Radiologist Assistant, and communication documented in the PACS Dashboard.   Electronically Signed   By: Vinnie Langton M.D.   On: 03/13/2013 10:11   Mr Jeri Cos [id number] Contrast  03/18/2013   CLINICAL DATA:  NEW DIAGNOSIS OF LUNG CANCER. Rule out metastases. Left basal ganglia infarct.  EXAM: MRI HEAD WITHOUT AND WITH CONTRAST  TECHNIQUE: Multiplanar, multiecho pulse sequences of the brain and surrounding structures were obtained without and with intravenous contrast.  CONTRAST:  28mL MULTIHANCE GADOBENATE DIMEGLUMINE 529 MG/ML IV SOLN  COMPARISON:  CT ANGIO NECK W/CM &/OR WO/CM dated 03/12/2013; CT HEAD W/O CM dated 03/11/2013  FINDINGS: A prominent acute/ subacute left MCA territory infarct involves the left insular cortex and operculum. There is some involvement of the caudate nucleus. The posterior lentiform nucleus is affected. Areas of hemorrhage are noted within the insular cortex and operculum. More remote areas of nonhemorrhagic infarct are present within the left caudate head. There are remote lacunar infarcts in  the right coronal radiata.  Least 1 focal area of remote blood products is present in the anterior right corona read out as well. A remote infarct is evident within left paramedian pons.  Remote lacunar infarcts are present in the basal ganglia by scratch the remote lacunar infarcts are present cerebellum bilaterally.  Postcontrast images demonstrate enhancement throughout areas of subacute infarction compatible with luxury perfusion. No enhancing mass lesion is evident to suggest metastasis. Although a small enhancing metastasis could certainly be lost amongst this cortical enhancement. The correspondence to diffusion abnormality in and timing is all compatible with a subacute infarct.  IMPRESSION: 1. Acute/subacute left MCA territory infarct involves the left insular cortex, operculum, posterior left lentiform nucleus and posterior caudate nucleus. Additional cortical areas over the left frontal lobe are involved. 2. Enhancement within the infarct territories is compatible with luxury perfusion. 3. Areas of hemorrhage are noted within the left insular cortex and operculum. 4. Remote lacunar infarcts involve the right coronal radiata and bilateral cerebellum. Critical Value/emergent results were called by telephone at the time of interpretation on 03/18/2013 at 7:29 PM to K. Schorr, who verbally acknowledged these results.   Electronically Signed   By: Lawrence Santiago M.D.   On: 03/18/2013 19:29   US Renal  03/17/2013   CLINICAL DATA:  Renal failure  EXAM: RENAL/URINARY TRACT ULTRASOUND COMPLETE  COMPARISON:  None.  FINDINGS: Right Kidney:  Length: 11.4 cm. There is some fullness of the right pelvocaliceal system and mild hydronephrosis is a consideration.  Left Kidney:  Length: 9.2 cm. No hydronephrosis is seen. The left kidney is more difficult to visualize due to bowel gas and both kidneys may be slightly echogenic. Correlation with renal laboratory values is recommended to assess for chronic renal medical disease.  Bladder:  A urinary  bladder Foley catheter is present but the urinary bladder is somewhat distended. Neither ureteral jet is noted.  IMPRESSION: 1. Slight fullness of the right pelvocaliceal system. Cannot exclude mild right hydronephrosis. 2. Somewhat echogenic renal parenchyma left more so than right may indicate chronic renal medical disease. Correlate with renal laboratory values. 3. Foley catheter within urinary bladder. No definite ureteral jet is seen.   Electronically Signed   By: Ivar Drape M.D.   On: 03/17/2013 15:06   US Carotid Duplex Bilateral  03/11/2013   CLINICAL DATA CVA, hypertension  EXAM BILATERAL CAROTID DUPLEX ULTRASOUND  TECHNIQUE Gray scale imaging, color Doppler and duplex ultrasound were performed of bilateral carotid and vertebral arteries in the neck.  COMPARISON CT HEAD W/O CM dated 03/11/2013  FINDINGS Criteria: Quantification of carotid stenosis is based on velocity parameters that correlate the residual internal carotid diameter with NASCET-based stenosis levels, using the diameter of the distal internal carotid lumen as the denominator for stenosis measurement.  The following velocity measurements were obtained:  RIGHT  ICA:  109/33 cm/sec  CCA:  [id number] cm/sec  SYSTOLIC ICA/CCA RATIO:  2.0  DIASTOLIC ICA/CCA RATIO:  3.4  ECA:  72 cm/sec  LEFT  ICA:  59/14 cm/sec  CCA:  99991111 cm/sec  SYSTOLIC ICA/CCA RATIO:  1.1  DIASTOLIC ICA/CCA RATIO:  1.1  ECA:  112 cm/sec  RIGHT CAROTID ARTERY: There is rather large amount of the eccentric echogenic partially shadowing plaque within the mid aspect of the right common carotid artery (representative image 7) extending to involve the distal aspects of the right common carotid artery (image 11). There is eccentric echogenic partially shadowing plaque within the right carotid bulb (images 14 and  16) extending to involve the origin and proximal aspect of the right internal carotid artery (image 24), not definitely resulting in elevated peak systolic velocities within  the interrogated course of the right internal carotid artery to suggest a hemodynamically significant stenosis.  RIGHT VERTEBRAL ARTERY:  Antegrade flow  LEFT CAROTID ARTERY: There is eccentric echogenic partially shadowing plaque within the mid and distal aspects of the left common carotid artery (representative images 42, 45 and 46). There is eccentric mixed echogenic partially shadowing plaque within the left carotid bulb (images 50 and 52), extending to involve the origin and proximal aspect of the left internal carotid artery (image 61), not resulting in elevated peak systolic velocities within the interrogated course of the left internal carotid artery to suggest a hemodynamically significant stenosis.  LEFT VERTEBRAL ARTERY:  Antegrade flow  IMPRESSION Rather extensive amount of irregular atherosclerotic plaque bilaterally, not definitely resulting in hemodynamically significant stenosis though could serve as a source of distal embolism. Further evaluation could be performed with CTA as clinically indicated.  SIGNATURE  Electronically Signed   By: Sandi Mariscal M.D.   On: 03/11/2013 16:55   Dg Chest Port 1 View  04/03/2013   CLINICAL DATA:  Tachycardia  EXAM: PORTABLE CHEST - 1 VIEW  COMPARISON:  03/16/2013  FINDINGS: Cardiac shadow is stable. Postsurgical changes are again seen. Old rib fractures are again seen on the right. No focal infiltrate or sizable effusion is noted. No acute bony abnormality is seen.  IMPRESSION: No acute abnormality noted.   Electronically Signed   By: Inez Catalina M.D.   On: 04/03/2013 14:36   Dg Chest Port 1 View  03/16/2013   CLINICAL DATA:  Shortness of breath.  EXAM: PORTABLE CHEST - 1 VIEW  COMPARISON:  CT CHEST W/CM dated 03/13/2013; DG CHEST 1 VIEW dated 03/11/2013  FINDINGS: Sequelae of prior CABG are again identified. Enlargement of the cardiac silhouette is unchanged. Thoracic aortic calcification is noted. Irregular opacity in the left lung apex is better evaluated on  recent CT. There is slightly increased linear opacity in the left lung base compared to the prior radiograph, and there is also mildly increased opacity in the right lung base. No large pleural effusion or pneumothorax is identified. No acute osseous abnormality is identified.  IMPRESSION: Increased linear opacity in the left lung base, likely atelectasis. Slightly increased right basilar opacity may also represent atelectasis although developing infiltrate is not excluded.   Electronically Signed   By: Logan Bores   On: 03/16/2013 08:54    Microbiology: Recent Results (from the past 240 hour(s))  URINE CULTURE     Status: None   Collection Time    04/03/13  3:55 PM      Result Value Ref Range Status   Specimen Description URINE, CLEAN CATCH   Final   Special Requests NONE   Final   Culture  Setup Time     Final   Value: 04/04/2013 00:15     Performed at [facility]     Final   Value: >=100,000 COLONIES/ML     Performed at [facility]   Culture     Final   Value: SERRATIA ODORIFERA     Performed at [facility]   Report Status 04/07/2013 FINAL   Final   Organism ID, Bacteria SERRATIA ODORIFERA   Final     Labs: Basic Metabolic Panel:  Recent Labs Lab 04/03/13 1415 04/07/13 0501  NA 135*  137  K 4.4 4.1  CL 97 101  CO2 23 23  GLUCOSE 142* 122*  BUN 17 18  CREATININE 1.02 0.87  CALCIUM 9.7 9.1  MG 2.1  --    Liver Function Tests: No results found for this basename: AST, ALT, ALKPHOS, BILITOT, PROT, ALBUMIN,  in the last 168 hours No results found for this basename: LIPASE, AMYLASE,  in the last 168 hours No results found for this basename: AMMONIA,  in the last 168 hours CBC:  Recent Labs Lab 04/03/13 1415  WBC 8.8  NEUTROABS 5.8  HGB 15.0  HCT 44.7  MCV 81.3  PLT 256   Cardiac Enzymes:  Recent Labs Lab 04/03/13 1415  TROPONINI <0.30   BNP: BNP (last 3 results)  Recent Labs  06/05/12 0459 11/01/12 0056  11/04/12 2131  PROBNP 4068.0* 7093.0* 6884.0*   CBG:  Recent Labs Lab 04/07/13 1146 04/07/13 1645 04/07/13 2156 04/08/13 0750 04/08/13 1149  GLUCAP 204* 95 121* 102* 157*    Time coordinating discharge: 35 minutes  Signed:  Janece Canterbury  [facility] 04/08/2013, 3:10 PM

## 2013-04-08 NOTE — Clinical Social Work Placement (Signed)
Clinical Social Work Department CLINICAL SOCIAL WORK PLACEMENT NOTE 04/08/2013  Patient:  [name], [name]  Account Number:  [id number] Admit date:  04/03/2013  Clinical Social Worker:  [name], LCSW  Date/time:  04/08/2013 01:41 PM  Clinical Social Work is seeking post-discharge placement for this patient at the following level of care:   [place]   (*CSW will update this form in Epic as items are completed)   04/08/2013  Patient/family provided with [place] Department of Clinical Social Work's list of facilities offering this level of care within the geographic area requested by the patient (or if unable, by the patient's family).  04/08/2013  Patient/family informed of their freedom to choose among providers that offer the needed level of care, that participate in Medicare, Medicaid or managed care program needed by the patient, have an available bed and are willing to accept the patient.  04/08/2013  Patient/family informed of [facility]' ownership interest in [facility], as well as of the fact that they are under no obligation to receive care at this facility.  PASARR submitted to EDS on  PASARR number received from [place] on   FL2 transmitted to all facilities in geographic area requested by pt/family on  04/08/2013 FL2 transmitted to all facilities within larger geographic area on   Patient informed that his/her managed care company has contracts with or will negotiate with  certain facilities, including the following:     Patient/family informed of bed offers received:   Patient chooses bed at  Physician recommends and patient chooses bed at    Patient to be transferred to  on   Patient to be transferred to facility by   The following physician request were entered in Epic:   Additional Comments: Pt has existing pasarr number.  [name], [place]

## 2013-04-08 NOTE — Evaluation (Signed)
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 Patient denied any symptoms. In the ED he was found to be in rapid A. fib with heart rate up to 170s. Remaining vitals were stable.  Clinical Impression  Mr. Viner has expressive dysphasia which makes it difficult to understand.  He will follow one step commands.  He was able to complete exercises with me as well as transfer from the bed to a chair with contact guard assist but refused to ambulate.  Pt is not safe at home and will benefit from skilled therapy to progress pt to a point where he is safe to ambulate I or mod I     Follow Up Recommendations SNF    Equipment Recommendations  None recommended by PT    Recommendations for Other Services   OT/SP    Precautions / Restrictions Precautions Precautions: None Restrictions Weight Bearing Restrictions: No      Mobility  Bed Mobility Overal bed mobility: Modified Independent     Transfers Overall transfer level: Needs assistance Equipment used: Rolling walker (2 wheeled) Transfers: Stand Pivot Transfers Sit to Stand: Independent Stand pivot transfers: Min guard                 Pertinent Vitals/Pain None noted     Home Living Family/patient expects to be discharged to:: Unsure Living Arrangements:  Non-relatives/Friends   Type of Home: House                           Comments: pt is not able to give any information     Hand Dominance   Dominant Hand: Right    Extremity/Trunk Assessment          RLE Deficits / Details: pt has antigravity strength of the RLE however strenght is about 1 grade less than LLE ...strength is generally 3/5       Communication   Communication: Expressive difficulties  Cognition Arousal/Alertness: Awake/alert Behavior During Therapy: WFL for tasks assessed/performed Overall Cognitive Status: Difficult to assess Area of Impairment: Attention;Following commands;Problem solving       Following Commands: Follows one step commands with increased time     Problem Solving: Slow processing;Requires verbal cues;Difficulty sequencing         Exercises General Exercises - Lower Extremity Ankle Circles/Pumps: Both;10 reps Hip ABduction/ADduction: 5 reps;Both Straight Leg Raises: Both;10 reps      Assessment/Plan    PT Assessment Patient needs continued PT services  PT Diagnosis Difficulty walking;Generalized weakness   PT Problem List Decreased strength;Decreased activity tolerance;Decreased balance;Decreased mobility  PT Treatment Interventions Gait training;Functional mobility training;Therapeutic exercise;Therapeutic activities   PT Goals (Current goals can be found in the Care Plan section) Acute Rehab PT Goals PT Goal Formulation: Patient unable to participate in goal setting Time For Goal Achievement: 04/10/13    Frequency Min 3X/week  Barriers to discharge Decreased caregiver support         End of Session Equipment Utilized During Treatment: Gait belt Activity Tolerance: Patient tolerated treatment well Patient left: in chair;with chair alarm set           Time: 1150-1220 PT Time Calculation (min): 30 min   Charges:   PT Evaluation $Initial PT Evaluation Tier I: 1 Procedure             Leeroy Cha 04/08/2013, 12:26 PM

## 2013-04-08 NOTE — Progress Notes (Signed)
NUTRITION FOLLOW UP  Intervention:   Continue with current nutrition plan of care  Nutrition Dx:   Inadequate oral intake related to poor appetite, as evidenced by recent meal intake hx and severe weight loss; progressing  Goal:   -Pt will consume oral supplement for a source of protein and calories; goal met  -Pt will meet >90% of estimated nutritional needs; goal met  Monitor:   PO intake, supplement acceptance, labs, weight changes, skin assessment, I/O's  Assessment:   Pt has gained 1# (0.1%) since last RD visit on 04/04/13. Appetite has improved greatly. Pt has been advanced to a dysphagia 3 diet and tolerating well. Meal intake 75-100%. Also receiving Glucerna Shake po TID, each supplement provides 220 kcal and 10 grams of protein.  Per MD notes, pt with overall poor prognosis. Discharge disposition is home with home health, however, heart rate control due to a-fib is a barrier to discharge.   Height: Ht Readings from Last 1 Encounters:  04/03/13 $RemoveB'6\' 1"'PKvrXfhr$  (1.854 m)    Weight Status:   Wt Readings from Last 1 Encounters:  04/06/13 142 lb 10.2 oz (64.7 kg)    Re-estimated needs:  Kcal: 0240-9735 daily Protein: 81-97 grams daily Fluid: 1.9-2.3 L daily  Skin: stage II sacral pressure ulcer  Diet Order: Dysphagia   Intake/Output Summary (Last 24 hours) at 04/08/13 1214 Last data filed at 04/08/13 0909  Gross per 24 hour  Intake    960 ml  Output   2203 ml  Net  -1243 ml    Last BM: 04/07/13   Labs:   Recent Labs Lab 04/03/13 1415 04/07/13 0501  NA 135* 137  K 4.4 4.1  CL 97 101  CO2 23 23  BUN 17 18  CREATININE 1.02 0.87  CALCIUM 9.7 9.1  MG 2.1  --   GLUCOSE 142* 122*    CBG (last 3)   Recent Labs  04/07/13 2156 04/08/13 0750 04/08/13 1149  GLUCAP 121* 102* 157*    Scheduled Meds: . aspirin  325 mg Oral Daily  . atorvastatin  80 mg Oral q1800  . cefTRIAXone (ROCEPHIN)  IV  1 g Intravenous Q24H  . diltiazem  120 mg Oral Daily  . feeding  supplement (GLUCERNA SHAKE)  237 mL Oral TID BM  . insulin aspart  0-15 Units Subcutaneous TID WC  . metoprolol succinate  125 mg Oral Daily  . potassium chloride SA  20 mEq Oral BID  . sodium chloride  3 mL Intravenous Q12H  . tamsulosin  0.4 mg Oral QPC supper  . Vitamin D (Ergocalciferol)  50,000 Units Oral Q7 days    Continuous Infusions:   [place] Moyd A. Jimmye Norman, RD, LDN Pager: [phone]

## 2013-04-08 NOTE — Clinical Social Work Note (Signed)
CSW presented bed offers to [facility] who chooses [facility]. Facility notified, but unable to receive authorization today this late. [name] is aware that if pt does not have authorization tomorrow, they will need to make plans to take pt home.   [name], [place]

## 2013-04-08 NOTE — Progress Notes (Signed)
Patient ID: [name], male   DOB: May 02, 1945, 68 y.o.   MRN: [id number]       Subjective:   No complaints this morning.    Objective:   Temp:  [97.6 F (36.4 C)-98.1 F (36.7 C)] 98.1 F (36.7 C) (04/08 0411) Pulse Rate:  [50-79] 79 (04/08 0411) Resp:  [20] 20 (04/08 0411) BP: (109-125)/(71-82) 116/82 mmHg (04/08 0411) SpO2:  [98 %-99 %] 98 % (04/08 0411) Last BM Date: 04/07/13  Filed Weights   04/04/13 0500 04/05/13 0500 04/06/13 0500  Weight: 141 lb 15.6 oz (64.4 kg) 141 lb 15.6 oz (64.4 kg) 142 lb 10.2 oz (64.7 kg)    Intake/Output Summary (Last 24 hours) at 04/08/13 0914 Last data filed at 04/08/13 0909  Gross per 24 hour  Intake    960 ml  Output   2203 ml  Net  -1243 ml    Telemetry: afib rates 70s-90s  Exam:  General:NAD  Resp: Clear anteriorally  Cardiac: irreg, 2/6 systolic murmur RUSB  OV:ZCHYIFO soft, NT, ND  MSK: LEs are warm, no edema   Lab Results:  Basic Metabolic Panel:  Recent Labs Lab 04/03/13 1415 04/07/13 0501  NA 135* 137  K 4.4 4.1  CL 97 101  CO2 23 23  GLUCOSE 142* 122*  BUN 17 18  CREATININE 1.02 0.87  CALCIUM 9.7 9.1  MG 2.1  --     Liver Function Tests: No results found for this basename: AST, ALT, ALKPHOS, BILITOT, PROT, ALBUMIN,  in the last 168 hours  CBC:  Recent Labs Lab 04/03/13 1415  WBC 8.8  HGB 15.0  HCT 44.7  MCV 81.3  PLT 256    Cardiac Enzymes:  Recent Labs Lab 04/03/13 1415  TROPONINI <0.30    BNP:  Recent Labs  06/05/12 0459 11/01/12 0056 11/04/12 2131  PROBNP 4068.0* 7093.0* 6884.0*    Coagulation: No results found for this basename: INR,  in the last 168 hours  ECG:   Medications:   Scheduled Medications: . aspirin  325 mg Oral Daily  . atorvastatin  80 mg Oral q1800  . cefTRIAXone (ROCEPHIN)  IV  1 g Intravenous Q24H  . diltiazem  120 mg Oral Daily  . feeding supplement (GLUCERNA SHAKE)  237 mL Oral TID BM  . insulin aspart  0-15 Units Subcutaneous TID  WC  . metoprolol succinate  125 mg Oral Daily  . potassium chloride SA  20 mEq Oral BID  . sodium chloride  3 mL Intravenous Q12H  . tamsulosin  0.4 mg Oral QPC supper  . Vitamin D (Ergocalciferol)  50,000 Units Oral Q7 days     Infusions:     PRN Medications:  acetaminophen, acetaminophen, ALPRAZolam, nitroGLYCERIN, ondansetron (ZOFRAN) IV, ondansetron     Assessment/Plan    1. Afib  - initially managed with IV dilt, now on oral dilt and Toprol.  - deemed previously per notes to be a poor candidate for anticoag due to falls and non-compliance, recent intracranial bleed 03/2013 after CVA. Continued on ASA  - rates by vitals checks and tele overall well controlled (70s-90s)on dilt and Toprol XL.  - continue current meds  2. CAD  - no current symptoms, no evidence of ischemia at this time  - continue secondary prevention  - recommend restarting his home statin unless there is a contraindication.  3. Descending thoracic aneurysm  - 4.3 cm in diameter on the prior examination from 08/25/2010 to 5.7 cm in diameter on 03/12/13.  -  per notes he is established with vascular surgery for follow up, Dr Trula Slade   4. Lung mass  - awaiting outpt PET scan   5. CVA  - recent left MCA stroke per notes        Carlyle Dolly, M.D., F.A.C.C.

## 2013-04-08 NOTE — Clinical Social Work Psychosocial (Signed)
Clinical Social Work Department BRIEF PSYCHOSOCIAL ASSESSMENT 04/08/2013  Patient:  [name], [name]     Account Number:  [id number]     Admit date:  04/03/2013  Clinical Social Worker:  Wyatt Haste  Date/Time:  04/08/2013 01:44 PM  Referred by:  CSW  Date Referred:  04/08/2013 Referred for  SNF Placement   Other Referral:   Interview type:  Patient Other interview type:   niece- [name]    PSYCHOSOCIAL DATA Living Status:  FRIEND(S) Admitted from facility:   Level of care:   Primary support name:  [name] Primary support relationship to patient:  FAMILY Degree of support available:   supportive    CURRENT CONCERNS Current Concerns  Post-Acute Placement   Other Concerns:    SOCIAL WORK ASSESSMENT / PLAN CSW met with pt at bedside. Pt alert, but oriented to self only. He was able to request that CSW call his niece, [name]. [name] reports that pt has been living with a younger couple who have been taking care of him for about a year. [name] has been primary caregiver and [name] said pt has appeared to be happy there. Pt called and left message telling family he was moving a year ago, but never left an address or a number to get in touch with him. They were very relieved several weeks ago when a home health nurse called them and have since been very involved. Pt had a stroke in March. Prior to this time, he was a limited assist, but now requires extensive assist. Pt has a sister, [name] who is [name]'s mother. [name]'s father was pt's HCPOA, but he passed away in 2022-09-08. [name] also reports pt has 2 sons who live out of state. [name] generally texts them updates about pt. Pt did not raise them. She indicates she is trying to get HCPOA. CSW discussed that pt was assessed for this yesterday and was not oriented enough to make these decisions. She reports understanding. PT evaluated pt today and recommendation is for SNF. CSW discussed placement process with [name] who is agreeable. She is not  sure how pt will feel about it as he generally wants to go back with his friends, but she plans to discuss with him this evening. [name] appeared to be very pleased with recommendation and requests either [facility] or [facility] as she lives in [place]. They have had another family member at [facility] in the past and were very pleased with facility. SNF list left in room.   Assessment/plan status:  Psychosocial Support/Ongoing Assessment of Needs Other assessment/ plan:   Information/referral to community resources:   SNF list    PATIENT'S/FAMILY'S RESPONSE TO PLAN OF CARE: Pt unable to discuss plan of care at this time, he defers to [place]. CSW will initiate bed search and follow up with bed offers when available.       [name], [place]

## 2013-04-09 ENCOUNTER — Inpatient Hospital Stay
Admission: RE | Admit: 2013-04-09 | Discharge: 2013-04-13 | Disposition: A | Discharge status: Still In Hospital | Payer: PRIVATE HEALTH INSURANCE | Source: Ambulatory Visit | Attending: Internal Medicine | Admitting: Internal Medicine

## 2013-04-09 LAB — GLUCOSE, CAPILLARY
Glucose-Capillary: 104 mg/dL — ABNORMAL HIGH (ref 70–99)
Glucose-Capillary: 162 mg/dL — ABNORMAL HIGH (ref 70–99)

## 2013-04-09 NOTE — Clinical Social Work Placement (Signed)
Clinical Social Work Department CLINICAL SOCIAL WORK PLACEMENT NOTE 04/09/2013  Patient:  LADARRIUS, BOGDANSKI  Account Number:  [id number] Admit date:  04/03/2013  Clinical Social Worker:  [name], LCSW  Date/time:  04/08/2013 01:41 PM  Clinical Social Work is seeking post-discharge placement for this patient at the following level of care:   [place]   (*CSW will update this form in Epic as items are completed)   04/08/2013  Patient/family provided with [place] Department of Clinical Social Work's list of facilities offering this level of care within the geographic area requested by the patient (or if unable, by the patient's family).  04/08/2013  Patient/family informed of their freedom to choose among providers that offer the needed level of care, that participate in Medicare, Medicaid or managed care program needed by the patient, have an available bed and are willing to accept the patient.  04/08/2013  Patient/family informed of [facility]' ownership interest in [facility], as well as of the fact that they are under no obligation to receive care at this facility.  PASARR submitted to EDS on  PASARR number received from [place] on   FL2 transmitted to all facilities in geographic area requested by pt/family on  04/08/2013 FL2 transmitted to all facilities within larger geographic area on   Patient informed that his/her managed care company has contracts with or will negotiate with  certain facilities, including the following:     Patient/family informed of bed offers received:  04/08/2013 Patient chooses bed at [facility] Physician recommends and patient chooses bed at  [facility]  Patient to be transferred to [facility] on  04/09/2013 Patient to be transferred to facility by RN  The following physician request were entered in Epic:   Additional Comments: Pt has existing pasarr number.  [name], [place]

## 2013-04-09 NOTE — Clinical Social Work Note (Addendum)
Bed now available at [facility]. Niece notified and very relieved and accepts offer. Pt to transfer today with RN. D/C summary faxed. Notified [facility] by voicemail.   [name], [place]

## 2013-04-09 NOTE — Progress Notes (Signed)
Patient is stable. His heart rate remains rate controlled. Medically ready for transfer to skilled nursing today.

## 2013-04-10 LAB — GLUCOSE, CAPILLARY
GLUCOSE-CAPILLARY: 191 mg/dL — AB (ref 70–99)
Glucose-Capillary: 127 mg/dL — ABNORMAL HIGH (ref 70–99)
Glucose-Capillary: 156 mg/dL — ABNORMAL HIGH (ref 70–99)
Glucose-Capillary: 154 mg/dL — ABNORMAL HIGH (ref 70–99)
Glucose-Capillary: 85 mg/dL (ref 70–99)

## 2013-04-11 LAB — GLUCOSE, CAPILLARY
GLUCOSE-CAPILLARY: 144 mg/dL — AB (ref 70–99)
GLUCOSE-CAPILLARY: 206 mg/dL — AB (ref 70–99)
Glucose-Capillary: 108 mg/dL — ABNORMAL HIGH (ref 70–99)

## 2013-04-12 LAB — GLUCOSE, CAPILLARY
GLUCOSE-CAPILLARY: 131 mg/dL — AB (ref 70–99)
GLUCOSE-CAPILLARY: 164 mg/dL — AB (ref 70–99)
Glucose-Capillary: 155 mg/dL — ABNORMAL HIGH (ref 70–99)
Glucose-Capillary: 104 mg/dL — ABNORMAL HIGH (ref 70–99)
Glucose-Capillary: 226 mg/dL — ABNORMAL HIGH (ref 70–99)

## 2013-04-13 ENCOUNTER — Emergency Department (HOSPITAL_COMMUNITY): Payer: PRIVATE HEALTH INSURANCE

## 2013-04-13 ENCOUNTER — Other Ambulatory Visit: Payer: Self-pay | Admitting: *Deleted

## 2013-04-13 ENCOUNTER — Telehealth: Payer: Self-pay | Admitting: Cardiology

## 2013-04-13 ENCOUNTER — Encounter (HOSPITAL_COMMUNITY): Payer: Self-pay | Admitting: Emergency Medicine

## 2013-04-13 ENCOUNTER — Observation Stay (HOSPITAL_COMMUNITY)
Admission: EM | Admit: 2013-04-13 | Discharge: 2013-04-14 | Disposition: A | Discharge status: Skilled Nursing Facility | Payer: PRIVATE HEALTH INSURANCE | Attending: Internal Medicine | Admitting: Internal Medicine

## 2013-04-13 ENCOUNTER — Encounter: Payer: Self-pay | Admitting: Internal Medicine

## 2013-04-13 ENCOUNTER — Non-Acute Institutional Stay (SKILLED_NURSING_FACILITY): Payer: PRIVATE HEALTH INSURANCE | Admitting: Internal Medicine

## 2013-04-13 DIAGNOSIS — I714 Abdominal aortic aneurysm, without rupture, unspecified: Secondary | ICD-10-CM

## 2013-04-13 DIAGNOSIS — I69998 Other sequelae following unspecified cerebrovascular disease: Secondary | ICD-10-CM | POA: Diagnosis not present

## 2013-04-13 DIAGNOSIS — F17200 Nicotine dependence, unspecified, uncomplicated: Secondary | ICD-10-CM | POA: Diagnosis not present

## 2013-04-13 DIAGNOSIS — I4891 Unspecified atrial fibrillation: Principal | ICD-10-CM

## 2013-04-13 DIAGNOSIS — E279 Disorder of adrenal gland, unspecified: Secondary | ICD-10-CM | POA: Diagnosis not present

## 2013-04-13 DIAGNOSIS — R0689 Other abnormalities of breathing: Secondary | ICD-10-CM

## 2013-04-13 DIAGNOSIS — I359 Nonrheumatic aortic valve disorder, unspecified: Secondary | ICD-10-CM

## 2013-04-13 DIAGNOSIS — Z79899 Other long term (current) drug therapy: Secondary | ICD-10-CM | POA: Insufficient documentation

## 2013-04-13 DIAGNOSIS — I2589 Other forms of chronic ischemic heart disease: Secondary | ICD-10-CM | POA: Diagnosis not present

## 2013-04-13 DIAGNOSIS — I5031 Acute diastolic (congestive) heart failure: Secondary | ICD-10-CM

## 2013-04-13 DIAGNOSIS — E785 Hyperlipidemia, unspecified: Secondary | ICD-10-CM

## 2013-04-13 DIAGNOSIS — Z720 Tobacco use: Secondary | ICD-10-CM

## 2013-04-13 DIAGNOSIS — I712 Thoracic aortic aneurysm, without rupture, unspecified: Secondary | ICD-10-CM

## 2013-04-13 DIAGNOSIS — I2510 Atherosclerotic heart disease of native coronary artery without angina pectoris: Secondary | ICD-10-CM

## 2013-04-13 DIAGNOSIS — R222 Localized swelling, mass and lump, trunk: Secondary | ICD-10-CM

## 2013-04-13 DIAGNOSIS — T83511A Infection and inflammatory reaction due to indwelling urethral catheter, initial encounter: Secondary | ICD-10-CM

## 2013-04-13 DIAGNOSIS — I350 Nonrheumatic aortic (valve) stenosis: Secondary | ICD-10-CM | POA: Diagnosis present

## 2013-04-13 DIAGNOSIS — Z9119 Patient's noncompliance with other medical treatment and regimen: Secondary | ICD-10-CM | POA: Diagnosis not present

## 2013-04-13 DIAGNOSIS — N319 Neuromuscular dysfunction of bladder, unspecified: Secondary | ICD-10-CM

## 2013-04-13 DIAGNOSIS — I484 Atypical atrial flutter: Secondary | ICD-10-CM

## 2013-04-13 DIAGNOSIS — J441 Chronic obstructive pulmonary disease with (acute) exacerbation: Secondary | ICD-10-CM

## 2013-04-13 DIAGNOSIS — R918 Other nonspecific abnormal finding of lung field: Secondary | ICD-10-CM | POA: Diagnosis present

## 2013-04-13 DIAGNOSIS — J9601 Acute respiratory failure with hypoxia: Secondary | ICD-10-CM

## 2013-04-13 DIAGNOSIS — Z91199 Patient's noncompliance with other medical treatment and regimen due to unspecified reason: Secondary | ICD-10-CM | POA: Insufficient documentation

## 2013-04-13 DIAGNOSIS — E119 Type 2 diabetes mellitus without complications: Secondary | ICD-10-CM

## 2013-04-13 DIAGNOSIS — I509 Heart failure, unspecified: Secondary | ICD-10-CM

## 2013-04-13 DIAGNOSIS — I10 Essential (primary) hypertension: Secondary | ICD-10-CM

## 2013-04-13 DIAGNOSIS — Z9181 History of falling: Secondary | ICD-10-CM | POA: Diagnosis not present

## 2013-04-13 DIAGNOSIS — J449 Chronic obstructive pulmonary disease, unspecified: Secondary | ICD-10-CM

## 2013-04-13 DIAGNOSIS — I639 Cerebral infarction, unspecified: Secondary | ICD-10-CM

## 2013-04-13 DIAGNOSIS — Z951 Presence of aortocoronary bypass graft: Secondary | ICD-10-CM | POA: Insufficient documentation

## 2013-04-13 DIAGNOSIS — K5641 Fecal impaction: Secondary | ICD-10-CM

## 2013-04-13 DIAGNOSIS — I69959 Hemiplegia and hemiparesis following unspecified cerebrovascular disease affecting unspecified side: Secondary | ICD-10-CM

## 2013-04-13 DIAGNOSIS — K5900 Constipation, unspecified: Secondary | ICD-10-CM

## 2013-04-13 DIAGNOSIS — Z7982 Long term (current) use of aspirin: Secondary | ICD-10-CM | POA: Insufficient documentation

## 2013-04-13 DIAGNOSIS — I4892 Unspecified atrial flutter: Secondary | ICD-10-CM

## 2013-04-13 DIAGNOSIS — E278 Other specified disorders of adrenal gland: Secondary | ICD-10-CM | POA: Diagnosis present

## 2013-04-13 DIAGNOSIS — N400 Enlarged prostate without lower urinary tract symptoms: Secondary | ICD-10-CM

## 2013-04-13 DIAGNOSIS — I69320 Aphasia following cerebral infarction: Secondary | ICD-10-CM

## 2013-04-13 DIAGNOSIS — N390 Urinary tract infection, site not specified: Secondary | ICD-10-CM

## 2013-04-13 DIAGNOSIS — E440 Moderate protein-calorie malnutrition: Secondary | ICD-10-CM

## 2013-04-13 DIAGNOSIS — J811 Chronic pulmonary edema: Secondary | ICD-10-CM

## 2013-04-13 DIAGNOSIS — R911 Solitary pulmonary nodule: Secondary | ICD-10-CM

## 2013-04-13 DIAGNOSIS — I25810 Atherosclerosis of coronary artery bypass graft(s) without angina pectoris: Secondary | ICD-10-CM

## 2013-04-13 LAB — CBC WITH DIFFERENTIAL/PLATELET
Basophils Absolute: 0 10*3/uL (ref 0.0–0.1)
Basophils Relative: 0 % (ref 0–1)
EOS ABS: 0.2 10*3/uL (ref 0.0–0.7)
EOS PCT: 2 % (ref 0–5)
HCT: 42 % (ref 39.0–52.0)
Hemoglobin: 13.9 g/dL (ref 13.0–17.0)
LYMPHS ABS: 1.7 10*3/uL (ref 0.7–4.0)
LYMPHS PCT: 14 % (ref 12–46)
MCH: 27.4 pg (ref 26.0–34.0)
MCHC: 33.1 g/dL (ref 30.0–36.0)
MCV: 82.8 fL (ref 78.0–100.0)
Monocytes Absolute: 1.1 10*3/uL — ABNORMAL HIGH (ref 0.1–1.0)
Monocytes Relative: 9 % (ref 3–12)
Neutro Abs: 9.3 10*3/uL — ABNORMAL HIGH (ref 1.7–7.7)
Neutrophils Relative %: 76 % (ref 43–77)
Platelets: 180 10*3/uL (ref 150–400)
RBC: 5.07 MIL/uL (ref 4.22–5.81)
RDW: 16.6 % — ABNORMAL HIGH (ref 11.5–15.5)
WBC: 12.3 10*3/uL — AB (ref 4.0–10.5)

## 2013-04-13 LAB — D-DIMER, QUANTITATIVE (NOT AT ARMC): D DIMER QUANT: 15.89 ug{FEU}/mL — AB (ref 0.00–0.48)

## 2013-04-13 LAB — GLUCOSE, CAPILLARY
GLUCOSE-CAPILLARY: 114 mg/dL — AB (ref 70–99)
Glucose-Capillary: 107 mg/dL — ABNORMAL HIGH (ref 70–99)
Glucose-Capillary: 93 mg/dL (ref 70–99)
Glucose-Capillary: 114 mg/dL — ABNORMAL HIGH (ref 70–99)

## 2013-04-13 LAB — BASIC METABOLIC PANEL
BUN: 23 mg/dL (ref 6–23)
CALCIUM: 9.6 mg/dL (ref 8.4–10.5)
CO2: 25 mEq/L (ref 19–32)
Chloride: 103 mEq/L (ref 96–112)
Creatinine, Ser: 1.01 mg/dL (ref 0.50–1.35)
GFR calc Af Amer: 86 mL/min — ABNORMAL LOW (ref >90)
GFR calc non Af Amer: 74 mL/min — ABNORMAL LOW (ref >90)
GLUCOSE: 140 mg/dL — AB (ref 70–99)
POTASSIUM: 5 meq/L (ref 3.7–5.3)
Sodium: 141 mEq/L (ref 137–147)

## 2013-04-13 LAB — TROPONIN I: Troponin I: <0.3 ng/mL (ref <0.30)

## 2013-04-13 MED ORDER — DILTIAZEM HCL ER COATED BEADS 180 MG PO CP24
180.0000 mg | ORAL_CAPSULE | Freq: Every day | ORAL | Status: DC
Start: 2013-04-13 — End: 2013-04-14
  Administered 2013-04-13 – 2013-04-14 (×2): 180 mg via ORAL
  Filled 2013-04-13 (×5): qty 1

## 2013-04-13 MED ORDER — ALPRAZOLAM 0.5 MG PO TABS: 0.5000 mg | ORAL_TABLET | Freq: Two times a day (BID) | ORAL | Status: DC | PRN

## 2013-04-13 MED ORDER — HEPARIN SODIUM (PORCINE) 5000 UNIT/ML IJ SOLN
5000.0000 [IU] | Freq: Three times a day (TID) | INTRAMUSCULAR | Status: DC
  Administered 2013-04-13 – 2013-04-14 (×3): 5000 [IU] via SUBCUTANEOUS
  Filled 2013-04-13 (×3): qty 1

## 2013-04-13 MED ORDER — NITROGLYCERIN 0.4 MG SL SUBL: 0.4000 mg | SUBLINGUAL_TABLET | SUBLINGUAL | Status: DC | PRN

## 2013-04-13 MED ORDER — POTASSIUM CHLORIDE CRYS ER 20 MEQ PO TBCR
20.0000 meq | EXTENDED_RELEASE_TABLET | Freq: Two times a day (BID) | ORAL | Status: DC
  Administered 2013-04-13 – 2013-04-14 (×2): 20 meq via ORAL
  Filled 2013-04-13: qty 2
  Filled 2013-04-13: qty 1

## 2013-04-13 MED ORDER — ATORVASTATIN CALCIUM 40 MG PO TABS
80.0000 mg | ORAL_TABLET | Freq: Every day | ORAL | Status: DC
  Administered 2013-04-13: 80 mg via ORAL
  Filled 2013-04-13 (×2): qty 1
  Filled 2013-04-13: qty 2

## 2013-04-13 MED ORDER — GLUCERNA SHAKE PO LIQD: 237.0000 mL | Freq: Every day | ORAL | Status: DC

## 2013-04-13 MED ORDER — DIGOXIN 250 MCG PO TABS: 0.1250 mg | ORAL_TABLET | Freq: Every day | ORAL | Status: DC

## 2013-04-13 MED ORDER — ONDANSETRON HCL 4 MG PO TABS: 4.0000 mg | ORAL_TABLET | Freq: Three times a day (TID) | ORAL | Status: DC | PRN

## 2013-04-13 MED ORDER — DILTIAZEM HCL 100 MG IV SOLR
5.0000 mg/h | INTRAVENOUS | Status: DC
  Administered 2013-04-13: 5 mg/h via INTRAVENOUS
  Filled 2013-04-13: qty 100

## 2013-04-13 MED ORDER — ALPRAZOLAM 1 MG PO TABS: ORAL_TABLET | ORAL | Status: AC

## 2013-04-13 MED ORDER — GLUCERNA SHAKE PO LIQD
237.0000 mL | Freq: Three times a day (TID) | ORAL | Status: DC
  Administered 2013-04-14 (×2): 237 mL via ORAL
  Filled 2013-04-13: qty 237

## 2013-04-13 MED ORDER — SODIUM CHLORIDE 0.9 % IJ SOLN
3.0000 mL | Freq: Two times a day (BID) | INTRAMUSCULAR | Status: DC
  Administered 2013-04-13 – 2013-04-14 (×2): 3 mL via INTRAVENOUS

## 2013-04-13 MED ORDER — METOPROLOL TARTRATE 25 MG PO TABS
25.0000 mg | ORAL_TABLET | Freq: Once | ORAL | Status: AC
Start: 2013-04-13 — End: 2013-04-13
  Administered 2013-04-13: 25 mg via ORAL
  Filled 2013-04-13: qty 1

## 2013-04-13 MED ORDER — METFORMIN HCL 500 MG PO TABS
500.0000 mg | ORAL_TABLET | Freq: Two times a day (BID) | ORAL | Status: DC
  Administered 2013-04-14: 500 mg via ORAL
  Filled 2013-04-13: qty 1

## 2013-04-13 MED ORDER — SODIUM CHLORIDE 0.9 % IJ SOLN
3.0000 mL | Freq: Two times a day (BID) | INTRAMUSCULAR | Status: DC
  Administered 2013-04-13: 3 mL via INTRAVENOUS

## 2013-04-13 MED ORDER — ONDANSETRON HCL 4 MG PO TABS: 4.0000 mg | ORAL_TABLET | Freq: Four times a day (QID) | ORAL | Status: DC | PRN

## 2013-04-13 MED ORDER — TAMSULOSIN HCL 0.4 MG PO CAPS
0.4000 mg | ORAL_CAPSULE | Freq: Every day | ORAL | Status: DC
  Administered 2013-04-13: 0.4 mg via ORAL
  Filled 2013-04-13 (×3): qty 1

## 2013-04-13 MED ORDER — ONDANSETRON HCL 4 MG/2ML IJ SOLN: 4.0000 mg | Freq: Four times a day (QID) | INTRAMUSCULAR | Status: DC | PRN

## 2013-04-13 MED ORDER — METOPROLOL SUCCINATE ER 50 MG PO TB24
125.0000 mg | ORAL_TABLET | Freq: Every day | ORAL | Status: DC
  Administered 2013-04-14: 125 mg via ORAL
  Filled 2013-04-13 (×3): qty 1
  Filled 2013-04-13: qty 2

## 2013-04-13 MED ORDER — METOPROLOL TARTRATE 1 MG/ML IV SOLN
5.0000 mg | Freq: Once | INTRAVENOUS | Status: AC
  Administered 2013-04-13: 5 mg via INTRAVENOUS
  Filled 2013-04-13: qty 5

## 2013-04-13 MED ORDER — INSULIN ASPART 100 UNIT/ML ~~LOC~~ SOLN
0.0000 [IU] | Freq: Three times a day (TID) | SUBCUTANEOUS | Status: DC
  Administered 2013-04-14: 2 [IU] via SUBCUTANEOUS

## 2013-04-13 MED ORDER — ASPIRIN 325 MG PO TABS
325.0000 mg | ORAL_TABLET | Freq: Every day | ORAL | Status: DC
  Administered 2013-04-14: 325 mg via ORAL
  Filled 2013-04-13: qty 1

## 2013-04-13 MED ORDER — DILTIAZEM HCL ER COATED BEADS 120 MG PO CP24: 120.0000 mg | ORAL_CAPSULE | Freq: Every day | ORAL | Status: DC

## 2013-04-13 MED ORDER — ASPIRIN 325 MG PO TABS: 325.0000 mg | ORAL_TABLET | Freq: Every day | ORAL | Status: DC

## 2013-04-13 MED ORDER — INSULIN ASPART 100 UNIT/ML ~~LOC~~ SOLN: 0.0000 [IU] | Freq: Every day | SUBCUTANEOUS | Status: DC

## 2013-04-13 MED ORDER — VITAMIN D (ERGOCALCIFEROL) 1.25 MG (50000 UNIT) PO CAPS: 50000.0000 [IU] | ORAL_CAPSULE | ORAL | Status: DC

## 2013-04-13 NOTE — Telephone Encounter (Signed)
[facility] 

## 2013-04-13 NOTE — Progress Notes (Signed)
This encounter was created in error - please disregard.

## 2013-04-13 NOTE — Progress Notes (Signed)
UR chart review completed.  

## 2013-04-13 NOTE — ED Notes (Signed)
Pt was sent from [facility] for evaluation of irregular heart rhythm.

## 2013-04-13 NOTE — H&P (Addendum)
[facility] History and Physical  EBON KETCHUM ZJQ:[id number] DOB: [birth year] DOA: 04/13/2013  Referring physician: ER. PCP: Carolee Rota, NP   Chief Complaint: Increased heart rate.  HPI: [name] is a 68 y.o. male  This is a 68 year old man who had a extensive left MCA stroke approximately one month ago. He also has a history of atrial fibrillation and now presents with increased ventricular rate. The patient himself is unable to give any clear history and is somewhat drowsy. On arrival to the emergency room from the nursing home he was found to have a ventricular rate in the 130s to 140s but after intravenous Cardizem, he appears to be back in sinus rhythm. Is now being admitted for observation. The patient has a history of presumed lung cancer in the left upper lobe which shows an enlarging mass. 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 He also has a right adrenal mass, increasing size of descending thoracic aortic aneurysm, history of coronary artery disease with CABG in 2004. The patient has diabetes. The patient was not started on anticoagulation due to frequent falls and noncompliance.   Review of Systems:  Unable to give me his drowsy state.  Past Medical History  Diagnosis Date  . Coronary atherosclerosis of native coronary artery     a. CABG in 2004 - LIMA to LAD, SVG to diag, SVG to OM, SVG to PDA. EF was 40%. b. Cath 2006 s/p DES to prox RCA.  . Type 2 diabetes mellitus   . Hyperlipidemia   . Essential hypertension, benign   . Atypical atrial flutter     a. Dx 06/2012. b. Not a candidate for anticoag due to fall, tenuous social situation.  . Ischemic cardiomyopathy     a. EF 40% in 2004. b. EF 55% in 06/2012.  Marland Kitchen Aortic stenosis     a. By echo 06/2012, very calcified but mean gradient 13mmHg - will need clinical f/u and serial echoes.  . Low TSH level     a. 06/2012, f/u pcp.  Marland Kitchen Poor social situation   .  Cerebral hemorrhage     a. At time of CABG - had post-op encephalopathy and small frontal hemorrhage in 2004.  Marland Kitchen Noncompliance   . Stroke    Past Surgical History  Procedure Laterality Date  . Coronary artery bypass graft      2004  . Cystoscopy N/A 03/18/2013    Procedure: CYSTOSCOPY FLEXIBLE, RECTAL EXAM;  Surgeon: Marissa Nestle, MD;  Location: [facility];  Service: Urology;  Laterality: N/A;   Social History:  reports that he has been smoking Cigarettes.  He has a 12.5 pack-year smoking history. He has never used smokeless tobacco. He reports that he does not drink alcohol or use illicit drugs.  Allergies  Allergen Reactions  . Benadryl [Diphenhydramine Hcl] Other (See Comments)    Hot Flashes "Like Niaspan"  . Niaspan [Niacin]     Family History  Problem Relation Age of Onset  . CAD Father 11     Prior to Admission medications   Medication Sig Start Date End Date Taking? Authorizing Provider  ALPRAZolam Duanne Moron) 1 MG tablet Take one tablet by mouth three times daily for anxiety 04/13/13  Yes Tiffany L Reed, DO  aspirin 325 MG tablet Take 1 tablet (325 mg total) by mouth daily. 03/20/13  Yes Nita Sells, MD  ciprofloxacin (CIPRO) 500 MG tablet Take 1 tablet (500 mg total) by mouth  2 (two) times daily. 04/08/13  Yes Janece Canterbury, MD  digoxin (LANOXIN) 0.125 MG tablet Take 0.125 mg by mouth daily.   Yes Historical Provider, MD  diltiazem (CARDIZEM CD) 120 MG 24 hr capsule Take 1 capsule (120 mg total) by mouth daily. 04/08/13  Yes Janece Canterbury, MD  feeding supplement, GLUCERNA SHAKE, (GLUCERNA SHAKE) LIQD Take 237 mLs by mouth daily. 04/08/13  Yes Janece Canterbury, MD  metFORMIN (GLUCOPHAGE) 500 MG tablet Take 500 mg by mouth 2 (two) times daily with a meal.   Yes Historical Provider, MD  metoprolol succinate (TOPROL-XL) 25 MG 24 hr tablet Take 5 tablets (125 mg total) by mouth daily. Take with or immediately following a meal. 04/08/13  Yes Janece Canterbury, MD  metoprolol  tartrate (LOPRESSOR) 25 MG tablet Take 25 mg by mouth once.   Yes Historical Provider, MD  nitroGLYCERIN (NITROSTAT) 0.4 MG SL tablet Place 0.4 mg under the tongue every 5 (five) minutes as needed for chest pain.   Yes Historical Provider, MD  ondansetron (ZOFRAN) 8 MG tablet Take by mouth every 8 (eight) hours as needed for nausea or vomiting.   Yes Historical Provider, MD  potassium chloride SA (K-DUR,KLOR-CON) 20 MEQ tablet Take 20 mEq by mouth 2 (two) times daily.   Yes Historical Provider, MD  rosuvastatin (CRESTOR) 40 MG tablet Take 40 mg by mouth daily.   Yes Historical Provider, MD  tamsulosin (FLOMAX) 0.4 MG CAPS capsule Take 1 capsule (0.4 mg total) by mouth daily after supper. 03/20/13  Yes Nita Sells, MD  Vitamin D, Ergocalciferol, (DRISDOL) 50000 UNITS CAPS capsule Take 50,000 Units by mouth every Friday.    Yes Historical Provider, MD   Physical Exam: Filed Vitals:   04/13/13 2017  BP:   Pulse:   Temp: 98.2 F (36.8 C)  Resp:     BP 119/80  Pulse 72  Temp(Src) 98.2 F (36.8 C) (Oral)  Resp 29  SpO2 96%  General:  Appears drowsy. Does not respond to name. Eyes: PERRL, normal lids, irises & conjunctiva ENT: grossly normal hearing, lips & tongue Neck: no LAD, masses or thyromegaly Cardiovascular: Ventricular rate is now 72 and appears to be in sinus rhythm. Systolic murmur consistent with aortic stenosis which he is known to have. Telemetry: SR, no arrhythmias  Respiratory: CTA bilaterally, no w/r/r. Normal respiratory effort. Abdomen: soft, ntnd Skin: no rash or induration seen on limited exam Musculoskeletal: grossly normal tone BUE/BLE Psychiatric: grossly normal mood and affect, speech fluent and appropriate Neurologic: grossly non-focal.          Labs on Admission:  Basic Metabolic Panel:  Recent Labs Lab 04/07/13 0501 04/13/13 1437  NA 137 141  K 4.1 5.0  CL 101 103  CO2 23 25  GLUCOSE 122* 140*  BUN 18 23  CREATININE 0.87 1.01  CALCIUM  9.1 9.6     CBC:  Recent Labs Lab 04/13/13 1437  WBC 12.3*  NEUTROABS 9.3*  HGB 13.9  HCT 42.0  MCV 82.8  PLT 180   Cardiac Enzymes:  Recent Labs Lab 04/13/13 1437  TROPONINI <0.30    BNP (last 3 results)  Recent Labs  06/05/12 0459 11/01/12 0056 11/04/12 2131  PROBNP 4068.0* 7093.0* 6884.0*   CBG:  Recent Labs Lab 04/12/13 1129 04/12/13 1621 04/12/13 1922 04/13/13 0652 04/13/13 1048  GLUCAP 131* 164* 226* 93 114*    Radiological Exams on Admission: Ct Angio Chest Pe W/cm &/or Wo Cm  04/13/2013   CLINICAL DATA:  Kidney  and elevated D-dimer. History of cerebral vascular accident.  EXAM: CT ANGIOGRAPHY CHEST WITH CONTRAST  TECHNIQUE: Multidetector CT imaging of the chest was performed using the standard protocol during bolus administration of intravenous contrast. Multiplanar CT image reconstructions and MIPs were obtained to evaluate the vascular anatomy.  CONTRAST:  100 cc Omnipaque  COMPARISON:  DG CHEST 1V PORT dated 04/13/2013; CT CHEST W/CM dated 03/13/2013; CT C SPINE W/O CM dated 06/02/2012; CT ANGIO NECK W/CM &/OR WO/CM dated 03/12/2013; CT HEAD W/O CM dated 03/11/2013; CT CHEST W/CM dated 08/25/2010  FINDINGS: There are no filling defects within the pulmonary arteries to suggest acute pulmonary embolism.  There is aneurysmal dilatation of the distal aspect of the descending thoracic aorta measuring 59 mm and sagittal dimension compared to 59 mm on prior. No pericardial fluid. Coronary calcifications are noted.  Review of the lung parenchyma demonstrates a focus of consolidation within the medial aspect of the left upper lobe measuring 4.0 x 2.8 cm similar to 4.2 x 1.8 cm on prior exam of 03/13/2013. This lesion has increased from CT of2012 where it measured 2.3 x 1.4 cm.  Mild basilar atelectasis.  No evidence of pulmonary infarction.  Limited view of the upper abdomen demonstrates no acute abnormality. Limited view of the skeleton demonstrates degenerative change of  the spine.  Review of the MIP images confirms the above findings.  IMPRESSION: 1. No evidence of acute pulmonary embolism. 2. 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 3. Aggressive enlargement of focal consolidation / nodularity within the medial left upper lobe. 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 Recommend biopsy.   Electronically Signed   By: Suzy Bouchard M.D.   On: 04/13/2013 19:28   Dg Chest Port 1 View  04/13/2013   CLINICAL DATA:  Hypertension, atrial flutter  EXAM: PORTABLE CHEST - 1 VIEW  COMPARISON:  04/03/2013  FINDINGS: Cardiomediastinal silhouette is stable. Elevation of the left hemidiaphragm again noted. No acute infiltrate or pleural effusion. No pulmonary edema. Status post CABG.  IMPRESSION: No active disease.  Status post CABG.   Electronically Signed   By: Lahoma Crocker M.D.   On: 04/13/2013 15:31    EKG: Independently reviewed. Atrial fibrillation with right bundle branch block. Although his ventricular rate is slowed, he may still be in atrial fibrillation.  Assessment/Plan   1. Atrial fibrillation with rapid ventricular response, ventricular rate improved. Not candidate for anticoagulation. 2. Presumed left upper lobe lung cancer, slow-growing. 3. Moderate aortic stenosis. 4. Right adrenal mass, unclear etiology. 5. 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 6. Hypertension. 7. Type 2 diabetes mellitus. 8. History of ischemic cardiomyopathy, currently compensated and not in heart failure. 9. Moderate aortic stenosis. 10. Palliative performance score 30% at best.  Plan: 1. Admit to telemetry. 2. Monitor overnight and reinitiate medications. Increase Cardizem dose. Discontinue IV Cardizem now. 3. Involve hospice services now. I believe this man's prognosis is less than 6 months based on all his medical problems. His diagnosis for hospice services would be CVA.   Further recommendations will depend on patient's hospital  progress.  Code Status: DO NOT RESUSCITATE. I did discuss with the patient's niece, who is the health care power of attorney, and she agrees with nonaggressive measures and also agrees with involvement of hospice services.  Family Communication: Discussion as above with patient's niece, who is the health care power of attorney.   Disposition Plan: Depending on progress. If he is stable, I think he would be appropriate to go  back to the skilled nursing facility with hospice services involved. At a later date, depending on his oral intake, he may transition to the hospice home.   Time spent: 45 minutes.  [facility] Pager [phone].

## 2013-04-13 NOTE — Telephone Encounter (Signed)
Number listed for sister is disconnected.  Will continue to try to contact family or nursing home.

## 2013-04-13 NOTE — Telephone Encounter (Signed)
New Message  Pt sister called states that he is the nursing home at [facility].. She is requesting a call back to determine if the appt is needed/ Pt is unable to walk// Please call back to discuss.

## 2013-04-13 NOTE — ED Provider Notes (Signed)
CSN: [id number]     Arrival date & time 04/13/13  1429 History  This chart was scribed for Orlie Dakin, MD by Zettie Pho, ED Scribe. This patient was seen in room APA19/APA19 and the patient's care was started at 2:46 PM.    Chief Complaint  Patient presents with  . Irregular Heart Beat   The history is provided by the patient. No language interpreter was used.   Level 5 Caveat- Non-communicative  HPI Comments: [name] is a 68 y.o. Male with a history of coronary atherosclerosis of native coronary artery, atypical atrial flutter, aortic stenosis, ischemic cardiomyopathy who presents to the Emergency Department from [facility] for evaluation of irregular heart rhythm (pulse was 146 upon arrival to the ED). [name] (LPN at [facility]) reports that the patient had a pulse of 124 with atrial fibrillation and rapid ventricular responses onset around 7:00 AM this morning, around 7 hours ago. She then contacted Dr. Dellia Nims, who ordered 25 mg of metoprolol and 0.5 mg digoxin. She reports that she rechecked the patient at 1:35 PM, around 1.5 hours ago, and the patient had a heart rate of 142, so she was advised to send him to the ED. She reports that the patient has been staying at [facility] since 04/09/2013, or 4 days ago, and that he is mostly non-verbal at baseline. Patient has allergies to Benadryl and Niaspan. Patient also has a history of type II DM, hyperlipidemia, HTN, low TSH level, cerebral hemorrhage, and stroke.   Past Medical History  Diagnosis Date  . Coronary atherosclerosis of native coronary artery     a. CABG in 2004 - LIMA to LAD, SVG to diag, SVG to OM, SVG to PDA. EF was 40%. b. Cath 2006 s/p DES to prox RCA.  . Type 2 diabetes mellitus   . Hyperlipidemia   . Essential hypertension, benign   . Atypical atrial flutter     a. Dx 06/2012. b. Not a candidate for anticoag due to fall, tenuous social situation.  . Ischemic cardiomyopathy     a. EF 40% in 2004. b. EF  55% in 06/2012.  Marland Kitchen Aortic stenosis     a. By echo 06/2012, very calcified but mean gradient 16mmHg - will need clinical f/u and serial echoes.  . Low TSH level     a. 06/2012, f/u pcp.  Marland Kitchen Poor social situation   . Cerebral hemorrhage     a. At time of CABG - had post-op encephalopathy and small frontal hemorrhage in 2004.  Marland Kitchen Noncompliance   . Stroke    Past Surgical History  Procedure Laterality Date  . Coronary artery bypass graft      2004  . Cystoscopy N/A 03/18/2013    Procedure: CYSTOSCOPY FLEXIBLE, RECTAL EXAM;  Surgeon: Marissa Nestle, MD;  Location: [facility];  Service: Urology;  Laterality: N/A;   Family History  Problem Relation Age of Onset  . CAD Father 15   History  Substance Use Topics  . Smoking status: Current Every Day Smoker -- 0.25 packs/day for 50 years    Types: Cigarettes  . Smokeless tobacco: Never Used  . Alcohol Use: No     Comment: Pt states he has not drank in 2 years    Review of Systems  Unable to perform ROS   Allergies  Benadryl and Niaspan  Home Medications   Current Outpatient Rx  Name  Route  Sig  Dispense  Refill  . ALPRAZolam Duanne Moron) 1  MG tablet      Take one tablet by mouth three times daily for anxiety   90 tablet   5   . aspirin 325 MG tablet   Oral   Take 1 tablet (325 mg total) by mouth daily.   30 tablet   0   . ciprofloxacin (CIPRO) 500 MG tablet   Oral   Take 1 tablet (500 mg total) by mouth 2 (two) times daily.         Marland Kitchen diltiazem (CARDIZEM CD) 120 MG 24 hr capsule   Oral   Take 1 capsule (120 mg total) by mouth daily.   30 capsule   0   . feeding supplement, GLUCERNA SHAKE, (GLUCERNA SHAKE) LIQD   Oral   Take 237 mLs by mouth 3 (three) times daily between meals.      0   . metFORMIN (GLUCOPHAGE) 500 MG tablet   Oral   Take 500 mg by mouth 2 (two) times daily with a meal.         . metoprolol succinate (TOPROL-XL) 25 MG 24 hr tablet   Oral   Take 5 tablets (125 mg total) by mouth daily. Take with  or immediately following a meal.         . nitroGLYCERIN (NITROSTAT) 0.4 MG SL tablet   Sublingual   Place 0.4 mg under the tongue every 5 (five) minutes as needed for chest pain.         Marland Kitchen ondansetron (ZOFRAN) 8 MG tablet   Oral   Take by mouth every 8 (eight) hours as needed for nausea or vomiting.         . potassium chloride SA (K-DUR,KLOR-CON) 20 MEQ tablet   Oral   Take 1 tablet (20 mEq total) by mouth 2 (two) times daily.   30 tablet   0   . rosuvastatin (CRESTOR) 40 MG tablet   Oral   Take 40 mg by mouth daily.         . tamsulosin (FLOMAX) 0.4 MG CAPS capsule   Oral   Take 1 capsule (0.4 mg total) by mouth daily after supper.   30 capsule   0   . Vitamin D, Ergocalciferol, (DRISDOL) 50000 UNITS CAPS capsule   Oral   Take 50,000 Units by mouth every 7 (seven) days.          Triage Vitals: BP 108/88  Pulse 146  Temp(Src) 97.9 F (36.6 C) (Oral)  Resp 38  SpO2 96%  Physical Exam  Nursing note and vitals reviewed. Constitutional: He appears well-developed and well-nourished.  HENT:  Head: Normocephalic and atraumatic.  Eyes: Conjunctivae are normal. Pupils are equal, round, and reactive to light.  Neck: Neck supple. No tracheal deviation present. No thyromegaly present.  Cardiovascular:  No murmur heard. Irregularly irregular mildly tachycardic 100 110 beats per minute  Pulmonary/Chest: Effort normal and breath sounds normal.  Abdominal: Soft. Bowel sounds are normal. He exhibits no distension. There is no tenderness.  Musculoskeletal: Normal range of motion. He exhibits no edema and no tenderness.  Neurological: He is alert. Coordination normal.  Able to move all extremities. Expressive aphasia  Skin: Skin is warm and dry. No rash noted.  Psychiatric: He has a normal mood and affect.    ED Course  Procedures (including critical care time)  DIAGNOSTIC STUDIES: Oxygen Saturation is 96% on room air, normal by my interpretation.     COORDINATION OF CARE: 2:49 PM- Will consult with [facility] to  gather more history and better determine the patient's baseline status.    Labs Review Labs Reviewed - No data to display Imaging Review No results found.    Date: 04/13/2013  1450 pm  Rate: 110  Rhythm: atrial fibrillation  QRS Axis: normal  Intervals: normal  ST/T Wave abnormalities: nonspecific T wave changes  Conduction Disutrbances:right bundle branch block  Narrative Interpretation:   Old EKG Reviewed: Rate has slowed as compared to tracing from 04/03/2013 interpreted by me otherwise no significant change Results for orders placed during the hospital encounter of 04/13/13  CBC WITH DIFFERENTIAL      Result Value Ref Range   WBC 12.3 (*) 4.0 - 10.5 K/uL   RBC 5.07  4.22 - 5.81 MIL/uL   Hemoglobin 13.9  13.0 - 17.0 g/dL   HCT 42.0  39.0 - 52.0 %   MCV 82.8  78.0 - 100.0 fL   MCH 27.4  26.0 - 34.0 pg   MCHC 33.1  30.0 - 36.0 g/dL   RDW 16.6 (*) 11.5 - 15.5 %   Platelets 180  150 - 400 K/uL   Neutrophils Relative % 76  43 - 77 %   Neutro Abs 9.3 (*) 1.7 - 7.7 K/uL   Lymphocytes Relative 14  12 - 46 %   Lymphs Abs 1.7  0.7 - 4.0 K/uL   Monocytes Relative 9  3 - 12 %   Monocytes Absolute 1.1 (*) 0.1 - 1.0 K/uL   Eosinophils Relative 2  0 - 5 %   Eosinophils Absolute 0.2  0.0 - 0.7 K/uL   Basophils Relative 0  0 - 1 %   Basophils Absolute 0.0  0.0 - 0.1 K/uL  BASIC METABOLIC PANEL      Result Value Ref Range   Sodium 141  137 - 147 mEq/L   Potassium 5.0  3.7 - 5.3 mEq/L   Chloride 103  96 - 112 mEq/L   CO2 25  19 - 32 mEq/L   Glucose, Bld 140 (*) 70 - 99 mg/dL   BUN 23  6 - 23 mg/dL   Creatinine, Ser 1.01  0.50 - 1.35 mg/dL   Calcium 9.6  8.4 - 10.5 mg/dL   GFR calc non Af Amer 74 (*) >90 mL/min   GFR calc Af Amer 86 (*) >90 mL/min  TROPONIN I      Result Value Ref Range   Troponin I <0.30  <0.30 ng/mL  D-DIMER, QUANTITATIVE      Result Value Ref Range   D-Dimer, Quant 15.89 (*) 0.00 - 0.48  ug/mL-FEU   Ct Angio Chest Pe W/cm &/or Wo Cm  04/13/2013   CLINICAL DATA:  Kidney and elevated D-dimer. History of cerebral vascular accident.  EXAM: CT ANGIOGRAPHY CHEST WITH CONTRAST  TECHNIQUE: Multidetector CT imaging of the chest was performed using the standard protocol during bolus administration of intravenous contrast. Multiplanar CT image reconstructions and MIPs were obtained to evaluate the vascular anatomy.  CONTRAST:  100 cc Omnipaque  COMPARISON:  DG CHEST 1V PORT dated 04/13/2013; CT CHEST W/CM dated 03/13/2013; CT C SPINE W/O CM dated 06/02/2012; CT ANGIO NECK W/CM &/OR WO/CM dated 03/12/2013; CT HEAD W/O CM dated 03/11/2013; CT CHEST W/CM dated 08/25/2010  FINDINGS: There are no filling defects within the pulmonary arteries to suggest acute pulmonary embolism.  There is aneurysmal dilatation of the distal aspect of the descending thoracic aorta measuring 59 mm and sagittal dimension compared to 59 mm on prior. No pericardial fluid.  Coronary calcifications are noted.  Review of the lung parenchyma demonstrates a focus of consolidation within the medial aspect of the left upper lobe measuring 4.0 x 2.8 cm similar to 4.2 x 1.8 cm on prior exam of 03/13/2013. This lesion has increased from CT of2012 where it measured 2.3 x 1.4 cm.  Mild basilar atelectasis.  No evidence of pulmonary infarction.  Limited view of the upper abdomen demonstrates no acute abnormality. Limited view of the skeleton demonstrates degenerative change of the spine.  Review of the MIP images confirms the above findings.  IMPRESSION: 1. No evidence of acute pulmonary embolism. 2. 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 3. Aggressive enlargement of focal consolidation / nodularity within the medial left upper lobe. 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 Recommend biopsy.   Electronically Signed   By: Suzy Bouchard M.D.   On: 04/13/2013 19:28   Mr Jeri Cos WU Contrast  03/18/2013   CLINICAL DATA:  NEW  DIAGNOSIS OF LUNG CANCER. Rule out metastases. Left basal ganglia infarct.  EXAM: MRI HEAD WITHOUT AND WITH CONTRAST  TECHNIQUE: Multiplanar, multiecho pulse sequences of the brain and surrounding structures were obtained without and with intravenous contrast.  CONTRAST:  70mL MULTIHANCE GADOBENATE DIMEGLUMINE 529 MG/ML IV SOLN  COMPARISON:  CT ANGIO NECK W/CM &/OR WO/CM dated 03/12/2013; CT HEAD W/O CM dated 03/11/2013  FINDINGS: A prominent acute/ subacute left MCA territory infarct involves the left insular cortex and operculum. There is some involvement of the caudate nucleus. The posterior lentiform nucleus is affected. Areas of hemorrhage are noted within the insular cortex and operculum. More remote areas of nonhemorrhagic infarct are present within the left caudate head. There are remote lacunar infarcts in the right coronal radiata. Least 1 focal area of remote blood products is present in the anterior right corona read out as well. A remote infarct is evident within left paramedian pons.  Remote lacunar infarcts are present in the basal ganglia by scratch the remote lacunar infarcts are present cerebellum bilaterally.  Postcontrast images demonstrate enhancement throughout areas of subacute infarction compatible with luxury perfusion. No enhancing mass lesion is evident to suggest metastasis. Although a small enhancing metastasis could certainly be lost amongst this cortical enhancement. The correspondence to diffusion abnormality in and timing is all compatible with a subacute infarct.  IMPRESSION: 1. Acute/subacute left MCA territory infarct involves the left insular cortex, operculum, posterior left lentiform nucleus and posterior caudate nucleus. Additional cortical areas over the left frontal lobe are involved. 2. Enhancement within the infarct territories is compatible with luxury perfusion. 3. Areas of hemorrhage are noted within the left insular cortex and operculum. 4. Remote lacunar infarcts  involve the right coronal radiata and bilateral cerebellum. Critical Value/emergent results were called by telephone at the time of interpretation on 03/18/2013 at 7:29 PM to K. Schorr, who verbally acknowledged these results.   Electronically Signed   By: Lawrence Santiago M.D.   On: 03/18/2013 19:29   US Renal  03/17/2013   CLINICAL DATA:  Renal failure  EXAM: RENAL/URINARY TRACT ULTRASOUND COMPLETE  COMPARISON:  None.  FINDINGS: Right Kidney:  Length: 11.4 cm. There is some fullness of the right pelvocaliceal system and mild hydronephrosis is a consideration.  Left Kidney:  Length: 9.2 cm. No hydronephrosis is seen. The left kidney is more difficult to visualize due to bowel gas and both kidneys may be slightly echogenic. Correlation with renal laboratory values is recommended to assess for chronic renal medical disease.  Bladder:  A  urinary bladder Foley catheter is present but the urinary bladder is somewhat distended. Neither ureteral jet is noted.  IMPRESSION: 1. Slight fullness of the right pelvocaliceal system. Cannot exclude mild right hydronephrosis. 2. Somewhat echogenic renal parenchyma left more so than right may indicate chronic renal medical disease. Correlate with renal laboratory values. 3. Foley catheter within urinary bladder. No definite ureteral jet is seen.   Electronically Signed   By: Ivar Drape M.D.   On: 03/17/2013 15:06   Dg Chest Port 1 View  04/13/2013   CLINICAL DATA:  Hypertension, atrial flutter  EXAM: PORTABLE CHEST - 1 VIEW  COMPARISON:  04/03/2013  FINDINGS: Cardiomediastinal silhouette is stable. Elevation of the left hemidiaphragm again noted. No acute infiltrate or pleural effusion. No pulmonary edema. Status post CABG.  IMPRESSION: No active disease.  Status post CABG.   Electronically Signed   By: Lahoma Crocker M.D.   On: 04/13/2013 15:31   Dg Chest Port 1 View  04/03/2013   CLINICAL DATA:  Tachycardia  EXAM: PORTABLE CHEST - 1 VIEW  COMPARISON:  03/16/2013  FINDINGS:  Cardiac shadow is stable. Postsurgical changes are again seen. Old rib fractures are again seen on the right. No focal infiltrate or sizable effusion is noted. No acute bony abnormality is seen.  IMPRESSION: No acute abnormality noted.   Electronically Signed   By: Inez Catalina M.D.   On: 04/03/2013 14:36   Dg Chest Port 1 View  03/16/2013   CLINICAL DATA:  Shortness of breath.  EXAM: PORTABLE CHEST - 1 VIEW  COMPARISON:  CT CHEST W/CM dated 03/13/2013; DG CHEST 1 VIEW dated 03/11/2013  FINDINGS: Sequelae of prior CABG are again identified. Enlargement of the cardiac silhouette is unchanged. Thoracic aortic calcification is noted. Irregular opacity in the left lung apex is better evaluated on recent CT. There is slightly increased linear opacity in the left lung base compared to the prior radiograph, and there is also mildly increased opacity in the right lung base. No large pleural effusion or pneumothorax is identified. No acute osseous abnormality is identified.  IMPRESSION: Increased linear opacity in the left lung base, likely atelectasis. Slightly increased right basilar opacity may also represent atelectasis although developing infiltrate is not excluded.   Electronically Signed   By: Logan Bores   On: 03/16/2013 08:54     Patient was medicated with Lopressor 5 mg IV at 1515 p.m. for tachycardia. At 1600 hours he was still noted to be tachycardic at approximately 125 beats per minute. Atrial fibrillation. With rapid ventricular response. Cardizem intravenous drip was ordered by me at 1600.  Chest x ray viewed by me MDM  CT angiography chest ordered as patient noted to be tachypneic and bedbound with elevated d-dimer. Final diagnoses:  None   case discussed with Dr. Anastasio Champion Plan 23 hour observation telemetry Diagnosis #1 atrial fibrillation with rapid ventricular response #2 lung mass #3hyperglycemia CRITICAL CARE Performed by: Orlie Dakin Total critical care time: 30 minute Critical  care time was exclusive of separately billable procedures and treating other patients. Critical care was necessary to treat or prevent imminent or life-threatening deterioration. Critical care was time spent personally by me on the following activities: development of treatment plan with patient and/or surrogate as well as nursing, discussions with consultants, evaluation of patient's response to treatment, examination of patient, obtaining history from patient or surrogate, ordering and performing treatments and interventions, ordering and review of laboratory studies, ordering and review of radiographic studies, pulse oximetry and  re-evaluation of patient's condition.   I personally performed the services described in this documentation, which was scribed in my presence. The recorded information has been reviewed and considered.      Orlie Dakin, MD 04/13/13 2239

## 2013-04-13 NOTE — Progress Notes (Signed)
Patient ID: [name], male   DOB: February 03, 1945, 68 y.o.   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 He is aphasic. He was seen by home health and found to have a pulse rate of 30 and brought to the hospital. In the ER he was found actually to be in rapid A. fib with heart rate up into the 170s. He received a Cardizem drip. He was transitioned to med Toprolol and oral diltiazem. However he still had tachycardia up into the 120s that was sustained for several hours at a time. Gradually his metoprolol dose was increased to 125 mg once a day under the guidance of cardiology. His heart rate was controlled for 24 hours and therefore he was sent to skilled care. Troponins were negative. He had an EKG in March that showed an EF of 50-55% and grade 2 diastolic heart failure. He had moderate mitral regurg and moderate aortic stenosis.  He was not felt to be a candidate for anticoagulation due to fall risk. He was continued on aspirin statins.  Past Medical History  Diagnosis Date  . Coronary atherosclerosis of native coronary artery     a. CABG in 2004 - LIMA to LAD, SVG to diag, SVG to OM, SVG to PDA. EF was 40%. b. Cath 2006 s/p DES to prox RCA.  . Type 2 diabetes mellitus   . Hyperlipidemia   . Essential hypertension, benign   . Atypical atrial flutter     a. Dx 06/2012. b. Not a candidate for anticoag due to fall, tenuous social situation.  . Ischemic cardiomyopathy     a. EF 40% in 2004. b. EF 55% in 06/2012.  Marland Kitchen Aortic stenosis     a. By echo 06/2012, very calcified but mean gradient 69mmHg - will need clinical f/u and serial echoes.  . Low TSH level     a. 06/2012, f/u pcp.  Marland Kitchen Poor social situation   . Cerebral hemorrhage     a. At time of CABG - had post-op encephalopathy and small frontal hemorrhage in 2004.  Marland Kitchen  Noncompliance   . Stroke    Past Surgical History  Procedure Laterality Date  . Coronary artery bypass graft      2004  . Cystoscopy N/A 03/18/2013    Procedure: CYSTOSCOPY FLEXIBLE, RECTAL EXAM;  Surgeon: Marissa Nestle, MD;  Location: [facility];  Service: Urology;  Laterality: N/A;   Medications Xanax 1 mg 4 times a day Aspirin 325 daily Cipro 500 twice a day Diltiazem 120 daily Metformin 500 twice a day metoprolol succinate i.e. metoprolol XL 5 tablets [125 mg] daily Nitroglycerin when necessary Zofran when necessary K-Dur 20 twice a day Crestor 20 daily Flomax 0.4 daily Vitamin D2 50,000 units every 7 days  Social; I spoke briefly to a niece who is his responsible party. Apparently the patient has been living with a younger couple over the last year and lost contact with family members. I am not precisely sure where he was or his functional status after his stroke. He has a DO NOT RESUSCITATE on the chart  reports that he has been smoking Cigarettes.  He has a 12.5 pack-year smoking history. He has never used smokeless tobacco. He reports that he does not drink alcohol or use illicit drugs.  family history includes CAD (age  of onset: 69) in his father.  Review of systems; Gen. not able to do this due to language disturbance however he did not look right to one of the nurses this morning and she is done and EKG. This shows atrial fibrillation with a ventricular response of 111. 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 Nothing particularly looks ischemic.  Physical exam Gen. somewhat gaunt-looking man Vitals; O2 sat 95% on room air respirations 22 pulse rate is in the 130s blood pressure was initially 100/73 and then repeated at 509 systolic Respiratory shallow air entry bilaterally no wheezing work of breathing does not appear to be excessive Cardiac heart sounds are tachycardic and irregular compatible with his known atrial fibrillation. There is  a 3/6 pansystolic murmur over the PMI and lower left sternal border JVP is not elevated. Old CABG scar no carotid bruits are heard Abdomen; no liver no spleen no overt tenderness GU Foley catheter in place there is no suprapubic or costovertebral angle tenderness Extremities probable PAD no evidence of a DVT. Neurologic He is able to move his extremities to command and appears to have antigravity strength. Some weakness vs. the left in the right arm with increased tone in the right arm. Reflexes are symmetrically 3+ both toes are downgoing. Speech is difficult to understand he is soft. He can clearly repeat. And can follow directions. He is probably not globally aphasia  Impression/plan #1 atrial fibrillation with rapid ventricular response; this is the same Mrs. was in the hospital. I've given him a 25 mg challenge of oral short-acting metoprolol and 0.5 mg of oral digoxin once. I will follow him this morning while I am here to see if we can avoid hospitalization. Troponin I was drawn electrolytes from 4/10 were within normal range CBC was normal #2 late effect dominant hemisphere CVA; this is from March. I am not completely certain about his best functional status since then and whether or not this is changed. In speaking with his niece he seemed fairly stable yesterday however she felt he may have actually been having trouble with his LOC i.e. eyes closed #3 urinary retention with a Foley catheter in and instructions for followup with urology appear #4 COPD this does not appear to be unstable #5 coronary artery disease status post CABG EKG this morning does not look any different from his most recent in the hospital given the variable rate difference #6 ascending thoracic aortic aneurysm with an increase in size from 4.3 cm to 5.7 cm. He is followed by vascular surgery. 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 He also has a left adrenal mass. He has been seen by oncology. Once again it is difficult to envision him undergoing an extensive metastatic workup at this point and/or a biopsy     EXAM: MRI HEAD WITHOUT AND WITH CONTRAST   TECHNIQUE: Multiplanar, multiecho pulse sequences of the brain and surrounding structures were obtained without and with intravenous contrast.   CONTRAST:  64mL MULTIHANCE GADOBENATE DIMEGLUMINE 529 MG/ML IV SOLN   COMPARISON:  CT ANGIO NECK W/CM &/OR WO/CM dated 03/12/2013; CT HEAD W/O CM dated 03/11/2013   FINDINGS: A prominent acute/ subacute left MCA territory infarct involves the left insular cortex and operculum. There is some involvement of the caudate nucleus.  The posterior lentiform nucleus is affected. Areas of hemorrhage are noted within the insular cortex and operculum. More remote areas of nonhemorrhagic infarct are present within the left caudate head. There are remote lacunar infarcts in the right coronal radiata. Least 1 focal area of remote blood products is present in the anterior right corona read out as well. A remote infarct is evident within left paramedian pons.   Remote lacunar infarcts are present in the basal ganglia by scratch the remote lacunar infarcts are present cerebellum bilaterally.   Postcontrast images demonstrate enhancement throughout areas of subacute infarction compatible with luxury perfusion. No enhancing mass lesion is evident to suggest metastasis. Although a small enhancing metastasis could certainly be lost amongst this cortical enhancement. The correspondence to diffusion abnormality in and timing is all compatible with a subacute infarct.   IMPRESSION: 1. Acute/subacute left MCA territory infarct involves the left insular cortex, operculum, posterior left lentiform nucleus and posterior caudate nucleus. Additional cortical  areas over the left frontal lobe are involved. 2. Enhancement within the infarct territories is compatible with luxury perfusion. 3. Areas of hemorrhage are noted within the left insular cortex and operculum. 4. Remote lacunar infarcts involve the right coronal radiata and bilateral cerebellum. Critical Value/emergent results were called by telephone at the time of interpretation on 03/18/2013 at 7:29 PM to K. Schorr, who verbally acknowledged these results.     Electronically Signed   By: Lawrence Santiago M.D.   On: 03/18/2013 19:29     PERFORMING   [facility], Deneise Lever [facility] cc:  ------------------------------------------------------------ LV EF: 50% -   55%  ------------------------------------------------------------ Indications:      Aortic stenosis 424.1.  ------------------------------------------------------------ History:   PMH:   Atrial fibrillation.  Coronary artery disease.  Congestive heart failure.  Aortic valve disease. Risk factors:  Ischemic cardiomyopathy Current tobacco use. Hypertension. Diabetes mellitus. Dyslipidemia.  ------------------------------------------------------------ Study Conclusions  - Left ventricle: The cavity size was normal. Wall thickness   was increased in a pattern of moderate LVH. Systolic   function was normal. The estimated ejection fraction was   in the range of 50% to 55%. There is hypokinesis of the   basalinferolateral myocardium. Features are consistent   with a possible pseudonormal left ventricular filling   pattern, with concomitant abnormal relaxation and   increased filling pressure (grade 2 diastolic   dysfunction). - Aortic valve: Mildly calcified annulus. Trileaflet;   moderately calcified leaflets. Cusp separation was   moderately to severely reduced. There was moderate   stenosis. Trivial regurgitation. Mean gradient: 19mm Hg   (S). Peak gradient: 77mm Hg (S). Valve area:   1.29cm^2(VTI). Valve area: 1.19cm^2 (Vmax). -  Aortic root: The aortic root was mildly to moderately   dilated. - Mitral valve: Calcified annulus. Mildly thickened, mildly   calcified leaflets . Leaflet separation was mildly   reduced. Mild to moderate regurgitation. Regurgitant   volume 35 mL by continuity equation. - Left atrium: The atrium was severely dilated. - Right ventricle: The cavity size was moderately dilated.   Systolic function was moderately reduced. - Right atrium: The atrium was moderately dilated. Central   venous pressure: 52mm Hg (est). - Atrial septum: No defect or patent foramen ovale was   identified. - Tricuspid valve: Mild regurgitation. - Pulmonary arteries: PA peak pressure: 31mm Hg (S). - Pericardium, extracardiac: There was no pericardial   effusion. Impressions:  - Moderate LVH with LVEF 50-55%, basal inferolateral   hypokinesis, probable grade 2 diastolic dysfunction.   Severe left  atrial enlargement. MAC with thickened mitral   leaflets and mild to moderate mitral regurgitation.   Moderate aortic stenosis as noted above with trivial   aortic regurgitation. Moderate RV dilatation with reduced   contraction. MIld tricuspid regurgitation with PASP 36   mmHg. Mild to moderate aortic root dilatation.  ------------------------------------------------------------ Labs, prior tests, procedures, and surgery: Coronary artery bypass grafting.  Transthoracic echocardiography.  M-mode, complete 2D, spectral Doppler, and color Doppler.  Height:  Height: 182.9cm. Height: 72in.  Weight:  Weight: 70.8kg. Weight: 155.7lb.  Body mass index:  BMI: 21.2kg/m^2.  Body surface area:    BSA: 1.56m^2.  Blood pressure:     102/73.  Patient status:  Inpatient.  Location:  Bedside.      Study Result    CLINICAL DATA:  Left upper lobe nodule identified on recent neck CT.   EXAM: CT CHEST WITH CONTRAST   TECHNIQUE: Multidetector CT imaging of the chest was performed during intravenous contrast administration.    CONTRAST:  30mL OMNIPAQUE IOHEXOL 300 MG/ML  SOLN   COMPARISON:  CT of the neck 03/12/2013.   FINDINGS: Mediastinum: Heart size is mildly enlarged. There is no significant pericardial fluid, thickening or pericardial calcification. There is atherosclerosis of the thoracic aorta, the great vessels of the mediastinum and the coronary arteries, including calcified atherosclerotic plaque in the left main, left anterior descending, left circumflex and right coronary arteries. Status post median sternotomy for CABG, including LIMA to the LAD. In addition, there is aneurysmal dilatation of the descending thoracic aorta which measures up to 5.7 cm in diameter (significantly increased compared to prior study 08/25/2010, at which point it measured only 4.3 cm in diameter). No pathologically enlarged mediastinal or hilar lymph nodes. Esophagus is unremarkable in appearance.   Lungs/Pleura: In the medial aspect of the apex of the left upper lobe there is an enlarging irregular-shaped masslike opacity that measures up to 4.2 x 2.2 cm (images 7 and 8 of series 3). There is some surrounding architectural distortion and this lesion makes contact with the overlying pleura both medially and anteriorly, where there is some mild pleural retraction. Probable scarring throughout the left lung base. Moderate diffuse bronchial wall thickening. Mild centrilobular emphysema. Small left pleural effusion layering dependently.   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 Diffuse adreniform thickening of the left adrenal gland is similar to the prior examination, although there is a new nodular component of this laterally which measures approximately 1.5 x 2.2 cm (image 55 of series 2), which is indeterminate.   Musculoskeletal: Old T12 compression fracture with approximately 40% loss of anterior vertebral body height  appear slightly increased compared to remote prior chest radiograph 09/13/2006. In addition, there is a new compression fracture of T8 with approximately 25% loss of anterior vertebral body height, which does not appear to be acute. There are no aggressive appearing lytic or blastic lesions noted in the visualized portions of the skeleton. Multiple old healed right-sided rib fractures. Median sternotomy wires.   IMPRESSION: 1. Interval growth of an irregular-shaped mass like opacity in the medial aspect of the left apex, which currently measures up to 4.2 x 2.2 cm and is highly concerning for a slow-growing neoplasm such as an adenocarcinoma. Correlation with PET-CT and/or biopsy is strongly recommended to exclude neoplasm at this time. 2. Fatty adreniform enlargement of the left adrenal gland is similar to the prior study, although there is a new soft tissue  nodule extending off the lateral aspect of the gland which measures 2.2 x 1.5 cm. This is indeterminate on today's examination, and the possibility of a metastatic lesion or primary adrenal lesion warrants consideration. This could be definitively characterized with MRI of the abdomen or noncontrast CT of the abdomen if clinically indicated. 3. Atherosclerosis, including left main and 3 vessel coronary artery disease. In addition, there is increasing aneurysmal dilatation of the descending thoracic aorta and visualized portions of the abdominal aorta, as above. In particular, the descending thoracic aorta has increased from 4.3 cm in diameter on the prior examination from 08/25/2010 to 5.7 cm in diameter on today's examination. Patient is status post median sternotomy for CABG. 4. Diffuse bronchial wall thickening with mild centrilobular emphysema ; imaging findings suggestive of underlying COPD. 5. Additional incidental findings, as above. These results will be called to the ordering clinician or representative by the  Radiologist Assistant, and communication documented in the PACS Dashboard.     Electronically Signed   By: Vinnie Langton M.D.   On: 03/13/2013 10:11

## 2013-04-13 NOTE — Telephone Encounter (Signed)
[place] and spoke with Estill Bamberg who gave me the # of [phone] for  [facility].  Spoke with Ms Combs who states pt is in the ED at [facility] right now because he is having further issues.  She reports the reason they are asking about follow up is because the pt was Dx with [facility] "all over" and because it is so difficult to transfer him d/t CVA from 3 weeks ago.  Advised I will forward information to MD for review but feel like pt is going to need to follow up as instructed.  She ask that I call healthcare POA Sonja back with any recommendations at [place].

## 2013-04-14 ENCOUNTER — Inpatient Hospital Stay
Admission: RE | Admit: 2013-04-14 | Discharge: 2013-06-01 | Disposition: E | Payer: PRIVATE HEALTH INSURANCE | Source: Ambulatory Visit | Attending: Internal Medicine | Admitting: Internal Medicine

## 2013-04-14 DIAGNOSIS — I4891 Unspecified atrial fibrillation: Secondary | ICD-10-CM | POA: Diagnosis not present

## 2013-04-14 DIAGNOSIS — J449 Chronic obstructive pulmonary disease, unspecified: Secondary | ICD-10-CM

## 2013-04-14 DIAGNOSIS — I6350 Cerebral infarction due to unspecified occlusion or stenosis of unspecified cerebral artery: Secondary | ICD-10-CM

## 2013-04-14 DIAGNOSIS — R05 Cough: Principal | ICD-10-CM

## 2013-04-14 DIAGNOSIS — R059 Cough, unspecified: Principal | ICD-10-CM

## 2013-04-14 DIAGNOSIS — R0989 Other specified symptoms and signs involving the circulatory and respiratory systems: Secondary | ICD-10-CM

## 2013-04-14 DIAGNOSIS — E119 Type 2 diabetes mellitus without complications: Secondary | ICD-10-CM

## 2013-04-14 DIAGNOSIS — J4489 Other specified chronic obstructive pulmonary disease: Secondary | ICD-10-CM

## 2013-04-14 LAB — COMPREHENSIVE METABOLIC PANEL
ALT: 27 U/L (ref 0–53)
AST: 22 U/L (ref 0–37)
Albumin: 2.8 g/dL — ABNORMAL LOW (ref 3.5–5.2)
Alkaline Phosphatase: 88 U/L (ref 39–117)
BUN: 17 mg/dL (ref 6–23)
CALCIUM: 9.2 mg/dL (ref 8.4–10.5)
CHLORIDE: 104 meq/L (ref 96–112)
CO2: 26 meq/L (ref 19–32)
Creatinine, Ser: 0.86 mg/dL (ref 0.50–1.35)
GFR, EST NON AFRICAN AMERICAN: 86 mL/min — AB (ref >90)
GLUCOSE: 96 mg/dL (ref 70–99)
Potassium: 4 mEq/L (ref 3.7–5.3)
SODIUM: 141 meq/L (ref 137–147)
Total Bilirubin: 0.7 mg/dL (ref 0.3–1.2)
Total Protein: 6.6 g/dL (ref 6.0–8.3)

## 2013-04-14 LAB — CBC
HCT: 41.5 % (ref 39.0–52.0)
Hemoglobin: 13.4 g/dL (ref 13.0–17.0)
MCH: 26.9 pg (ref 26.0–34.0)
MCHC: 32.3 g/dL (ref 30.0–36.0)
MCV: 83.3 fL (ref 78.0–100.0)
Platelets: 175 10*3/uL (ref 150–400)
RBC: 4.98 MIL/uL (ref 4.22–5.81)
RDW: 16.5 % — AB (ref 11.5–15.5)
WBC: 7.8 10*3/uL (ref 4.0–10.5)

## 2013-04-14 LAB — GLUCOSE, CAPILLARY
GLUCOSE-CAPILLARY: 101 mg/dL — AB (ref 70–99)
Glucose-Capillary: 123 mg/dL — ABNORMAL HIGH (ref 70–99)

## 2013-04-14 MED ORDER — DILTIAZEM HCL ER COATED BEADS 180 MG PO CP24: 180.0000 mg | ORAL_CAPSULE | Freq: Every day | ORAL | Status: AC

## 2013-04-14 NOTE — Discharge Summary (Addendum)
Patient seen and examined.  Agree with note as above.  This gentleman was admitted to the hospital with rapid atrial fibrillation.  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  They have elected to focus his care on quality of life and pursue hospice care services.  Considering his multiple comorbidities, this appears to be a reasonable option.  His symptoms appear to be reasonably controlled, and he appears appropriate to discharge back to SNF. Further adjustments/simplification in medication regimen to be reviewed by hospice services when they see the patient after discharge.  [employer]

## 2013-04-14 NOTE — Clinical Social Work Psychosocial (Signed)
Clinical Social Work Department BRIEF PSYCHOSOCIAL ASSESSMENT 04/07/2013  Patient:  DONNAVIN, VANDENBRINK     Account Number:  [id number]     Admit date:  04/13/2013  Clinical Social Worker:  Wyatt Haste  Date/Time:  04/03/2013 10:52 AM  Referred by:  Physician  Date Referred:  04/01/2013 Referred for  SNF Placement   Other Referral:   Interview type:  Patient Other interview type:   niece- [name]    PSYCHOSOCIAL DATA Living Status:  FACILITY Admitted from facility:  [facility] Level of care:  [place] Primary support name:  [name] Primary support relationship to patient:  FAMILY Degree of support available:   supportive    CURRENT CONCERNS Current Concerns  Post-Acute Placement   Other Concerns:    SOCIAL WORK ASSESSMENT / PLAN CSW met with pt at bedside. Pt alert, but pleasantly confused. States he is in [place], but did not know he was in hospital. Pt known to [place] from admission last week. CSW spoke with pt's niece, [name] who reports that they love [place]. He has been there less than a week and she visited him on Sunday. [name] felt like he was acting different, but yesterday it was definitely noted and pt was brought to ED. Admitting physician discussed considering hospice for pt with [name]. She discussed further with family last night and all are in agreement. [name] appears to be handling this information well and very much wants him to return to [facility]. CSW discussed with Tami at [facility] and they are agreeable. Request to make hospice referral themselves tomorrow. Tami will call [name] to discuss insurance changes due to hospice. Agreeable to no FL2 due to <24 hour observation. Pt to d/c today back to [facility]. CSW will fax d/c summary upon completion. Pt to transfer with RN.   Assessment/plan status:  Referral to [employer] Other assessment/ plan:   Information/referral to community resources:   [facility] to make hospice referral tomorrow     PATIENT'S/FAMILY'S RESPONSE TO PLAN OF CARE: Pt unable to discuss plan of care. [name] requests hospice referral and for pt to return to [facility].       [name], [place]

## 2013-04-14 NOTE — Progress Notes (Signed)
Patient with orders to be discharge back to [facility]. Discharge packet sent with patient. Report called to [facility], nurse. Patient stable. Patient transported via staff.

## 2013-04-14 NOTE — Telephone Encounter (Signed)
Sonja aware no need for follow up

## 2013-04-14 NOTE — Discharge Summary (Signed)
Physician Discharge Summary  [name]:[id number] DOB: 1945/06/20 DOA: 04/13/2013  PCP: Carolee Rota, NP  Admit date: 04/13/2013 Discharge date: 05-12-2013  Time spent: 40  minutes  Recommendations for Outpatient Follow-up:  1. Discharge to [facility] with Hospice care. They will make referral. 2.   Discharge Diagnoses:  Active Problems:   Hypertension   Diabetes mellitus   Aortic stenosis   CHF (congestive heart failure)   CVA (cerebral infarction)   Adrenal mass, right   Lung mass   Atrial fibrillation with rapid ventricular response   AF (atrial fibrillation)   Discharge Condition: stable  Diet recommendation: dysphasia 3 thin liquid  Filed Weights   04/13/13 2130  Weight: 68.8 kg (151 lb 10.8 oz)    History of present illness:  [name] is a 68 y.o. male who had a extensive left MCA stroke approximately one month ago. He also has a history of atrial fibrillation and presented to ED on 03/13/13 with increased ventricular rate. The patient himself  unable to give any clear history and was somewhat drowsy. On arrival to the emergency room from the nursing home he was found to have a ventricular rate in the 130s to 140s but after intravenous Cardizem, he appeared to be back in sinus rhythm. was admitted for observation. The patient has a history of presumed lung cancer in the left upper lobe which shows an enlarging mass. 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 He also has a right adrenal mass, increasing size of descending thoracic aortic aneurysm, history of coronary artery disease with CABG in 2004. The patient has diabetes. The patient was not started on anticoagulation due to frequent falls and noncompliance.   Hospital Course:  1. Atrial fibrillation with rapid ventricular response, ventricular rate improved. Increased cardizem dose. Resume dig. Continue BB.  Not candidate for  anticoagulation. 2. Presumed left upper lobe lung cancer, slow-growing. No plans for further work up/treatment per niece HCPOA. Planning to initiate Hospice care 3. Moderate aortic stenosis. Has appointment with vascular 04/20/13 4. Right adrenal mass, unclear etiology. See #2. 5. 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 Resident at [facility].  6. Hypertension. Fair control 7. Type 2 diabetes mellitus. controlled 8. History of ischemic cardiomyopathy, Compensated and not in heart failure during this hospitalization. 9. Moderate aortic stenosis. 10. Palliative performance score 30% at best. Discussed Hospice care with niece who agreed. Hospice referral will be made by [facility]   Procedures:  none  Consultations:  none  Discharge Exam: Filed Vitals:   12-May-2013 0336  BP: 112/70  Pulse: 70  Temp: 97.3 F (36.3 C)  Resp: 29    General: somewhat frail appearing. NAD Cardiovascular: RRR +murmur no gallup no LE edema Respiratory: normal effort BS somewhat distant but clear bilaterally no wheeze or crackles  Discharge Instructions You were cared for by a hospitalist during your hospital stay. If you have any questions about your discharge medications or the care you received while you were in the hospital after you are discharged, you can call the unit and asked to speak with the hospitalist on call if the hospitalist that took care of you is not available. Once you are discharged, your primary care physician will handle any further medical issues. Please note that NO REFILLS for any discharge medications will be authorized once you are discharged, as it is imperative that you return to your primary care physician (or establish a  relationship with a primary care physician if you do not have one) for your aftercare needs so that they can reassess your need for medications and monitor your lab values.   Future Appointments Provider Department Dept Phone   04/20/2013  10:00 AM Serafina Mitchell, MD [facility] [phone]       Medication List    STOP taking these medications       metoprolol tartrate 25 MG tablet  Commonly known as:  LOPRESSOR      TAKE these medications       ALPRAZolam 1 MG tablet  Commonly known as:  XANAX  Take one tablet by mouth three times daily for anxiety     aspirin 325 MG tablet  Take 1 tablet (325 mg total) by mouth daily.     ciprofloxacin 500 MG tablet  Commonly known as:  CIPRO  Take 1 tablet (500 mg total) by mouth 2 (two) times daily.     digoxin 0.125 MG tablet  Commonly known as:  LANOXIN  Take 0.125 mg by mouth daily.     diltiazem 180 MG 24 hr capsule  Commonly known as:  CARDIZEM CD  Take 1 capsule (180 mg total) by mouth daily.     feeding supplement (GLUCERNA SHAKE) Liqd  Take 237 mLs by mouth daily.     metFORMIN 500 MG tablet  Commonly known as:  GLUCOPHAGE  Take 500 mg by mouth 2 (two) times daily with a meal.     metoprolol succinate 25 MG 24 hr tablet  Commonly known as:  TOPROL-XL  Take 5 tablets (125 mg total) by mouth daily. Take with or immediately following a meal.     nitroGLYCERIN 0.4 MG SL tablet  Commonly known as:  NITROSTAT  Place 0.4 mg under the tongue every 5 (five) minutes as needed for chest pain.     ondansetron 8 MG tablet  Commonly known as:  ZOFRAN  Take by mouth every 8 (eight) hours as needed for nausea or vomiting.     potassium chloride SA 20 MEQ tablet  Commonly known as:  K-DUR,KLOR-CON  Take 20 mEq by mouth 2 (two) times daily.     rosuvastatin 40 MG tablet  Commonly known as:  CRESTOR  Take 40 mg by mouth daily.     tamsulosin 0.4 MG Caps capsule  Commonly known as:  FLOMAX  Take 1 capsule (0.4 mg total) by mouth daily after supper.     Vitamin D (Ergocalciferol) 50000 UNITS Caps capsule  Commonly known as:  DRISDOL  Take 50,000 Units by mouth every Friday.       Allergies  Allergen Reactions  . Benadryl  [Diphenhydramine Hcl] Other (See Comments)    Hot Flashes "Like Niaspan"  . Niaspan [Niacin]       The results of significant diagnostics from this hospitalization (including imaging, microbiology, ancillary and laboratory) are listed below for reference.    Significant Diagnostic Studies: Ct Angio Chest Pe W/cm &/or Wo Cm  04/13/2013   CLINICAL DATA:  Kidney and elevated D-dimer. History of cerebral vascular accident.  EXAM: CT ANGIOGRAPHY CHEST WITH CONTRAST  TECHNIQUE: Multidetector CT imaging of the chest was performed using the standard protocol during bolus administration of intravenous contrast. Multiplanar CT image reconstructions and MIPs were obtained to evaluate the vascular anatomy.  CONTRAST:  100 cc Omnipaque  COMPARISON:  DG CHEST 1V PORT dated 04/13/2013; CT CHEST W/CM dated 03/13/2013; CT C SPINE W/O CM dated  06/02/2012; CT ANGIO NECK W/CM &/OR WO/CM dated 03/12/2013; CT HEAD W/O CM dated 03/11/2013; CT CHEST W/CM dated 08/25/2010  FINDINGS: There are no filling defects within the pulmonary arteries to suggest acute pulmonary embolism.  There is aneurysmal dilatation of the distal aspect of the descending thoracic aorta measuring 59 mm and sagittal dimension compared to 59 mm on prior. No pericardial fluid. Coronary calcifications are noted.  Review of the lung parenchyma demonstrates a focus of consolidation within the medial aspect of the left upper lobe measuring 4.0 x 2.8 cm similar to 4.2 x 1.8 cm on prior exam of 03/13/2013. This lesion has increased from CT of2012 where it measured 2.3 x 1.4 cm.  Mild basilar atelectasis.  No evidence of pulmonary infarction.  Limited view of the upper abdomen demonstrates no acute abnormality. Limited view of the skeleton demonstrates degenerative change of the spine.  Review of the MIP images confirms the above findings.  IMPRESSION: 1. No evidence of acute pulmonary embolism. 2. 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 3.  Aggressive enlargement of focal consolidation / nodularity within the medial left upper lobe. 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 Recommend biopsy.   Electronically Signed   By: Suzy Bouchard M.D.   On: 04/13/2013 19:28   Mr Jeri Cos XT Contrast  03/18/2013   CLINICAL DATA:  NEW DIAGNOSIS OF LUNG CANCER. Rule out metastases. Left basal ganglia infarct.  EXAM: MRI HEAD WITHOUT AND WITH CONTRAST  TECHNIQUE: Multiplanar, multiecho pulse sequences of the brain and surrounding structures were obtained without and with intravenous contrast.  CONTRAST:  67mL MULTIHANCE GADOBENATE DIMEGLUMINE 529 MG/ML IV SOLN  COMPARISON:  CT ANGIO NECK W/CM &/OR WO/CM dated 03/12/2013; CT HEAD W/O CM dated 03/11/2013  FINDINGS: A prominent acute/ subacute left MCA territory infarct involves the left insular cortex and operculum. There is some involvement of the caudate nucleus. The posterior lentiform nucleus is affected. Areas of hemorrhage are noted within the insular cortex and operculum. More remote areas of nonhemorrhagic infarct are present within the left caudate head. There are remote lacunar infarcts in the right coronal radiata. Least 1 focal area of remote blood products is present in the anterior right corona read out as well. A remote infarct is evident within left paramedian pons.  Remote lacunar infarcts are present in the basal ganglia by scratch the remote lacunar infarcts are present cerebellum bilaterally.  Postcontrast images demonstrate enhancement throughout areas of subacute infarction compatible with luxury perfusion. No enhancing mass lesion is evident to suggest metastasis. Although a small enhancing metastasis could certainly be lost amongst this cortical enhancement. The correspondence to diffusion abnormality in and timing is all compatible with a subacute infarct.  IMPRESSION: 1. Acute/subacute left MCA territory infarct involves the left insular cortex, operculum, posterior left lentiform nucleus and  posterior caudate nucleus. Additional cortical areas over the left frontal lobe are involved. 2. Enhancement within the infarct territories is compatible with luxury perfusion. 3. Areas of hemorrhage are noted within the left insular cortex and operculum. 4. Remote lacunar infarcts involve the right coronal radiata and bilateral cerebellum. Critical Value/emergent results were called by telephone at the time of interpretation on 03/18/2013 at 7:29 PM to K. Schorr, who verbally acknowledged these results.   Electronically Signed   By: Lawrence Santiago M.D.   On: 03/18/2013 19:29   US Renal  03/17/2013   CLINICAL DATA:  Renal failure  EXAM: RENAL/URINARY TRACT ULTRASOUND COMPLETE  COMPARISON:  None.  FINDINGS: Right Kidney:  Length:  11.4 cm. There is some fullness of the right pelvocaliceal system and mild hydronephrosis is a consideration.  Left Kidney:  Length: 9.2 cm. No hydronephrosis is seen. The left kidney is more difficult to visualize due to bowel gas and both kidneys may be slightly echogenic. Correlation with renal laboratory values is recommended to assess for chronic renal medical disease.  Bladder:  A urinary bladder Foley catheter is present but the urinary bladder is somewhat distended. Neither ureteral jet is noted.  IMPRESSION: 1. Slight fullness of the right pelvocaliceal system. Cannot exclude mild right hydronephrosis. 2. Somewhat echogenic renal parenchyma left more so than right may indicate chronic renal medical disease. Correlate with renal laboratory values. 3. Foley catheter within urinary bladder. No definite ureteral jet is seen.   Electronically Signed   By: Ivar Drape M.D.   On: 03/17/2013 15:06   Dg Chest Port 1 View  04/13/2013   CLINICAL DATA:  Hypertension, atrial flutter  EXAM: PORTABLE CHEST - 1 VIEW  COMPARISON:  04/03/2013  FINDINGS: Cardiomediastinal silhouette is stable. Elevation of the left hemidiaphragm again noted. No acute infiltrate or pleural effusion. No pulmonary  edema. Status post CABG.  IMPRESSION: No active disease.  Status post CABG.   Electronically Signed   By: Lahoma Crocker M.D.   On: 04/13/2013 15:31   Dg Chest Port 1 View  04/03/2013   CLINICAL DATA:  Tachycardia  EXAM: PORTABLE CHEST - 1 VIEW  COMPARISON:  03/16/2013  FINDINGS: Cardiac shadow is stable. Postsurgical changes are again seen. Old rib fractures are again seen on the right. No focal infiltrate or sizable effusion is noted. No acute bony abnormality is seen.  IMPRESSION: No acute abnormality noted.   Electronically Signed   By: Inez Catalina M.D.   On: 04/03/2013 14:36   Dg Chest Port 1 View  03/16/2013   CLINICAL DATA:  Shortness of breath.  EXAM: PORTABLE CHEST - 1 VIEW  COMPARISON:  CT CHEST W/CM dated 03/13/2013; DG CHEST 1 VIEW dated 03/11/2013  FINDINGS: Sequelae of prior CABG are again identified. Enlargement of the cardiac silhouette is unchanged. Thoracic aortic calcification is noted. Irregular opacity in the left lung apex is better evaluated on recent CT. There is slightly increased linear opacity in the left lung base compared to the prior radiograph, and there is also mildly increased opacity in the right lung base. No large pleural effusion or pneumothorax is identified. No acute osseous abnormality is identified.  IMPRESSION: Increased linear opacity in the left lung base, likely atelectasis. Slightly increased right basilar opacity may also represent atelectasis although developing infiltrate is not excluded.   Electronically Signed   By: Logan Bores   On: 03/16/2013 08:54    Microbiology: No results found for this or any previous visit (from the past 240 hour(s)).   Labs: Basic Metabolic Panel:  Recent Labs Lab 04/13/13 1437 04/03/2013 0453  NA 141 141  K 5.0 4.0  CL 103 104  CO2 25 26  GLUCOSE 140* 96  BUN 23 17  CREATININE 1.01 0.86  CALCIUM 9.6 9.2   Liver Function Tests:  Recent Labs Lab 04/01/2013 0453  AST 22  ALT 27  ALKPHOS 88  BILITOT 0.7  PROT 6.6   ALBUMIN 2.8*   No results found for this basename: LIPASE, AMYLASE,  in the last 168 hours No results found for this basename: AMMONIA,  in the last 168 hours CBC:  Recent Labs Lab 04/13/13 1437 04/11/2013 0453  WBC 12.3* 7.8  NEUTROABS 9.3*  --   HGB 13.9 13.4  HCT 42.0 41.5  MCV 82.8 83.3  PLT 180 175   Cardiac Enzymes:  Recent Labs Lab 04/13/13 1437  TROPONINI <0.30   BNP: BNP (last 3 results)  Recent Labs  06/05/12 0459 11/01/12 0056 11/04/12 2131  PROBNP 4068.0* 7093.0* 6884.0*   CBG:  Recent Labs Lab 04/12/13 1922 04/13/13 0652 04/13/13 1048 04/13/13 2152 04/22/2013 0739  GLUCAP 226* 93 114* 114* 101*       Signed:  Lezlie Octave Black  [facility] 04/13/2013, 10:54 AM

## 2013-04-14 NOTE — Progress Notes (Signed)
Nutrition Brief Note  Patient identified on the Malnutrition Screening Tool (MST) Report  Chart reviewed. Pt to be discharged back to [facility] today with hospice services.  Angelli Baruch A. Jimmye Norman, RD, LDN Pager: [phone]

## 2013-04-14 NOTE — Telephone Encounter (Signed)
He does not need follow up.

## 2013-04-15 ENCOUNTER — Encounter: Payer: PRIVATE HEALTH INSURANCE | Admitting: Cardiology

## 2013-04-15 ENCOUNTER — Non-Acute Institutional Stay (SKILLED_NURSING_FACILITY): Payer: Medicare Other | Admitting: Internal Medicine

## 2013-04-15 DIAGNOSIS — R911 Solitary pulmonary nodule: Secondary | ICD-10-CM

## 2013-04-15 DIAGNOSIS — I69959 Hemiplegia and hemiparesis following unspecified cerebrovascular disease affecting unspecified side: Secondary | ICD-10-CM

## 2013-04-15 DIAGNOSIS — I4891 Unspecified atrial fibrillation: Secondary | ICD-10-CM

## 2013-04-15 LAB — GLUCOSE, CAPILLARY
GLUCOSE-CAPILLARY: 91 mg/dL (ref 70–99)
Glucose-Capillary: 172 mg/dL — ABNORMAL HIGH (ref 70–99)
Glucose-Capillary: 170 mg/dL — ABNORMAL HIGH (ref 70–99)
Glucose-Capillary: 125 mg/dL — ABNORMAL HIGH (ref 70–99)
Glucose-Capillary: 163 mg/dL — ABNORMAL HIGH (ref 70–99)

## 2013-04-16 LAB — GLUCOSE, CAPILLARY
GLUCOSE-CAPILLARY: 132 mg/dL — AB (ref 70–99)
Glucose-Capillary: 111 mg/dL — ABNORMAL HIGH (ref 70–99)
Glucose-Capillary: 89 mg/dL (ref 70–99)

## 2013-04-17 ENCOUNTER — Encounter: Payer: Self-pay | Admitting: Surgery

## 2013-04-17 LAB — GLUCOSE, CAPILLARY
GLUCOSE-CAPILLARY: 115 mg/dL — AB (ref 70–99)
GLUCOSE-CAPILLARY: 173 mg/dL — AB (ref 70–99)
GLUCOSE-CAPILLARY: 173 mg/dL — AB (ref 70–99)
Glucose-Capillary: 148 mg/dL — ABNORMAL HIGH (ref 70–99)
Glucose-Capillary: 204 mg/dL — ABNORMAL HIGH (ref 70–99)

## 2013-04-18 LAB — GLUCOSE, CAPILLARY
GLUCOSE-CAPILLARY: 123 mg/dL — AB (ref 70–99)
GLUCOSE-CAPILLARY: 119 mg/dL — AB (ref 70–99)
GLUCOSE-CAPILLARY: 228 mg/dL — AB (ref 70–99)
Glucose-Capillary: 92 mg/dL (ref 70–99)
Glucose-Capillary: 94 mg/dL (ref 70–99)

## 2013-04-19 LAB — GLUCOSE, CAPILLARY
GLUCOSE-CAPILLARY: 154 mg/dL — AB (ref 70–99)
GLUCOSE-CAPILLARY: 116 mg/dL — AB (ref 70–99)
GLUCOSE-CAPILLARY: 182 mg/dL — AB (ref 70–99)
Glucose-Capillary: 167 mg/dL — ABNORMAL HIGH (ref 70–99)
Glucose-Capillary: 94 mg/dL (ref 70–99)
Glucose-Capillary: 137 mg/dL — ABNORMAL HIGH (ref 70–99)
Glucose-Capillary: 116 mg/dL — ABNORMAL HIGH (ref 70–99)

## 2013-04-20 ENCOUNTER — Encounter: Payer: PRIVATE HEALTH INSURANCE | Admitting: Surgery

## 2013-04-20 LAB — GLUCOSE, CAPILLARY
Glucose-Capillary: 101 mg/dL — ABNORMAL HIGH (ref 70–99)
Glucose-Capillary: 116 mg/dL — ABNORMAL HIGH (ref 70–99)
Glucose-Capillary: 141 mg/dL — ABNORMAL HIGH (ref 70–99)

## 2013-04-20 NOTE — Progress Notes (Addendum)
Patient ID: [name], male   DOB: [birth year], 68 y.o.   MRN: [id number]                    PROGRESS NOTE  DATE:  04/15/2013   FACILITY: [facility]    LEVEL OF CARE:   SNF   Acute Visit   HISTORY OF PRESENT ILLNESS:  This is a 68 year-old man who was just admitted to the facility on 04/13/2013.  He was admitted to hospital initially with atrial fibrillation with rapid ventricular response.  He has a history of coronary artery disease and is status post CABG x4.    He had also had a recent left middle cerebral artery stroke in March of this year and was left aphasic.    When I saw him on 04/13/2013, he was tachycardic so I gave him an extra dose of metoprolol as well as digoxin, although he continued to be tachycardic up into the 140s.  He was sent to the emergency room.  At this point, I do not have access to [facility] and I am not completely certain what was done.  However, he has been returned to the facility.  It does not seem that there are any major adjustments made, although they may have increased his diltiazem from 120 to 180.    There has been some talk in the facility about hospice-based care here.    PHYSICAL EXAMINATION:   VITAL SIGNS:   PULSE:  75.   O2 SATURATIONS:  94% on room air.   RESPIRATIONS:  18 and unlabored.   GENERAL APPEARANCE:  The patient is alert.  Extends his hand to me when I came in the room.    CARDIOVASCULAR:  CARDIAC:  Heart sounds are regular.  There is a midsystolic ejection murmur.  Old CABG scar.  None of this is new.  He does not appear to be in congestive heart failure.   NEUROLOGICAL:   I see no major changes here.   SENSATION/STRENGTH:  He is able to move his extremities to command.  He has some weakness on the right side.    ASSESSMENT/PLAN:  Atrial fibrillation.  This seems under better control at the moment.  I believe he had an increased dose of Cardizem CD to 180 mg from 120.    Late-effect dominant  hemisphere CVA.  I really do not have a great sense of his functional status prior to his arrival here.    Urinary retention.  Supposed to follow up with Urology.    Ascending thoracic aortic aneurysm.  I do not think this gentleman is going to be a surgical candidate.  He also has a palpable abdominal aorta.  We will have to see if this was assessed.    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  He also has a left adrenal mass.  Once again, it is difficult to envision him undergoing an extensive metastatic work-up, biopsy, or at this point treatment.

## 2013-04-21 LAB — GLUCOSE, CAPILLARY: GLUCOSE-CAPILLARY: 97 mg/dL (ref 70–99)

## 2013-04-22 LAB — GLUCOSE, CAPILLARY
GLUCOSE-CAPILLARY: 93 mg/dL (ref 70–99)
GLUCOSE-CAPILLARY: 93 mg/dL (ref 70–99)
GLUCOSE-CAPILLARY: 103 mg/dL — AB (ref 70–99)
Glucose-Capillary: 133 mg/dL — ABNORMAL HIGH (ref 70–99)
Glucose-Capillary: 114 mg/dL — ABNORMAL HIGH (ref 70–99)
Glucose-Capillary: 233 mg/dL — ABNORMAL HIGH (ref 70–99)
Glucose-Capillary: 110 mg/dL — ABNORMAL HIGH (ref 70–99)

## 2013-04-23 LAB — GLUCOSE, CAPILLARY
Glucose-Capillary: 115 mg/dL — ABNORMAL HIGH (ref 70–99)
Glucose-Capillary: 205 mg/dL — ABNORMAL HIGH (ref 70–99)

## 2013-04-24 LAB — GLUCOSE, CAPILLARY
GLUCOSE-CAPILLARY: 100 mg/dL — AB (ref 70–99)
GLUCOSE-CAPILLARY: 184 mg/dL — AB (ref 70–99)

## 2013-04-25 LAB — GLUCOSE, CAPILLARY
Glucose-Capillary: 129 mg/dL — ABNORMAL HIGH (ref 70–99)
Glucose-Capillary: 183 mg/dL — ABNORMAL HIGH (ref 70–99)

## 2013-04-26 LAB — GLUCOSE, CAPILLARY: Glucose-Capillary: 237 mg/dL — ABNORMAL HIGH (ref 70–99)

## 2013-04-27 LAB — GLUCOSE, CAPILLARY
GLUCOSE-CAPILLARY: 126 mg/dL — AB (ref 70–99)
Glucose-Capillary: 116 mg/dL — ABNORMAL HIGH (ref 70–99)

## 2013-04-28 LAB — GLUCOSE, CAPILLARY
GLUCOSE-CAPILLARY: 117 mg/dL — AB (ref 70–99)
Glucose-Capillary: 110 mg/dL — ABNORMAL HIGH (ref 70–99)

## 2013-04-29 LAB — GLUCOSE, CAPILLARY
Glucose-Capillary: 101 mg/dL — ABNORMAL HIGH (ref 70–99)
Glucose-Capillary: 141 mg/dL — ABNORMAL HIGH (ref 70–99)

## 2013-04-30 LAB — GLUCOSE, CAPILLARY: Glucose-Capillary: 129 mg/dL — ABNORMAL HIGH (ref 70–99)

## 2013-05-01 LAB — GLUCOSE, CAPILLARY
GLUCOSE-CAPILLARY: 130 mg/dL — AB (ref 70–99)
Glucose-Capillary: 120 mg/dL — ABNORMAL HIGH (ref 70–99)

## 2013-05-01 DEATH — deceased

## 2013-05-02 LAB — GLUCOSE, CAPILLARY: Glucose-Capillary: 133 mg/dL — ABNORMAL HIGH (ref 70–99)

## 2013-05-03 LAB — GLUCOSE, CAPILLARY: GLUCOSE-CAPILLARY: 149 mg/dL — AB (ref 70–99)

## 2013-05-04 LAB — GLUCOSE, CAPILLARY
GLUCOSE-CAPILLARY: 118 mg/dL — AB (ref 70–99)
GLUCOSE-CAPILLARY: 113 mg/dL — AB (ref 70–99)
Glucose-Capillary: 117 mg/dL — ABNORMAL HIGH (ref 70–99)
Glucose-Capillary: 151 mg/dL — ABNORMAL HIGH (ref 70–99)
Glucose-Capillary: 144 mg/dL — ABNORMAL HIGH (ref 70–99)

## 2013-05-05 ENCOUNTER — Non-Acute Institutional Stay (SKILLED_NURSING_FACILITY): Payer: Medicare Other | Admitting: Internal Medicine

## 2013-05-05 ENCOUNTER — Encounter: Payer: Self-pay | Admitting: Internal Medicine

## 2013-05-05 DIAGNOSIS — I4891 Unspecified atrial fibrillation: Secondary | ICD-10-CM

## 2013-05-05 DIAGNOSIS — I509 Heart failure, unspecified: Secondary | ICD-10-CM

## 2013-05-05 DIAGNOSIS — R1013 Epigastric pain: Secondary | ICD-10-CM

## 2013-05-05 DIAGNOSIS — I639 Cerebral infarction, unspecified: Secondary | ICD-10-CM

## 2013-05-05 DIAGNOSIS — G8929 Other chronic pain: Secondary | ICD-10-CM | POA: Insufficient documentation

## 2013-05-05 DIAGNOSIS — I6350 Cerebral infarction due to unspecified occlusion or stenosis of unspecified cerebral artery: Secondary | ICD-10-CM

## 2013-05-05 DIAGNOSIS — R52 Pain, unspecified: Secondary | ICD-10-CM

## 2013-05-05 DIAGNOSIS — E119 Type 2 diabetes mellitus without complications: Secondary | ICD-10-CM

## 2013-05-05 LAB — GLUCOSE, CAPILLARY: Glucose-Capillary: 97 mg/dL (ref 70–99)

## 2013-05-05 NOTE — Progress Notes (Signed)
Patient ID: [name], male   DOB: November 06, 1945, 69 y.o.   MRN: [id number]   this is an acute visit.  Level care skilled.  Facility [facility].  Chief complaint-acute visit followup pain management-atrial fibrillation.  History of present illness.  Patient is an elderly resident with a history of CVA as well as atrial fibrillation with rapid ventricular response complicated with bradycardia.  This has been relatively stable recently.  However apparently he is complaining of pain generalized more so in his back and nursing staff does not feel that Tylenol is totally effective.  Patient is a poor historian secondary to significant dementia-he is under hospice care secondary to multiple medical issues including probable malignant pulmonary nodule as well as CVA late effect.  Patient did have a fall several days ago apparently he fell forward and did sustain a small hematoma to his right orbital area apparently he did not land on his back in the pain complaints of the back did not increase after the fall.  Family medical social history has been reviewed most recently progress note on 04/15/2013.  Medications at been reviewed per [facility].  Review of systems quite limited secondary to patient being hard of hearing with significant dementia.  In general his only complaint apparently is some pain more so in his back-nursing staff has not -noted any chest pain complaints or shortness of breath or cough.  Physical exam.  Temperature 97.9 pulse 73 respirations 18 blood pressure 102/71.--Weight is 147.4 this has been relatively stable-O2 saturations have been in the 90s consistently  On room air  In general this is a frail elderly male in no distress sitting comfortably in his wheelchair.  His skin is warm and dry he does have a violaceous hematoma around his right suborbital area however lurid conjunctiva are clear pupils reactive visual daily appears grossly intact.  Oropharynx clear mucous  membranes moist.  Chest poor respiratory effort with shallow air entry I did not appreciate any congestion or labored breathing however heart is regular rate and rhythm with a 2/6 systolic murmur he does not really have significant lower extremity edema.  Abdomen soft nontender positive bowel sounds.  Muscle skeletal he has good grip strength bilaterally he is able to move all 4 extremities although does not really follow verbal commands real well so difficult to get to extent of his strength.  His back he is somewhat kyphjotic I do not note any deformities of the thoracic or lumbar spine-there is some mild tenderness to palpation  There is no edema deformity or bruising.  Neurologic as stated above I do not really note much focal deficits although this is a limited exam patient does not really follow verbal commands very well.  Psych he is oriented to self only.  Labs.  04/27/2013.  Urine culture did grew out greater than 100,000 colonies of Serratia Marcescens He is completing course of Cipro.  04/20/2013.  WBC 8.1 hemoglobin 12.6 platelets 197.  Sodium 140 potassium 3.9 BUN 17 creatinine 0.93.  Liver function tests within normal limits except albumin of 2.7.  Assessment and plan.  #1-back pain-Will begin tramadol 50 mg every 6 hours when necessary pain to see if this will help again he is on minimal pain medication with Tylenol continue to monitor.  #2 atrial fibrillation this appears rate controlled he is on digoxin as well as Cardizem he is on aspirin for anticoagulation.  #3 history of CVA late effect he continues on aspirin he is under hospice care this  appears to be relatively stable although he is quite debilitated.  #4-history UTI he is completing a course of ciprofloxacin appears to be largely asymptomatic currently he is afebrile does not complain of dysuria although again of course he is a poor historian.  #5-diabetes--he continues on Glucophage blood sugars  appear to be stable largely in the lower 100s in the morning more of a low mid 100s later in the day.  #6--CHF-this appears clinically stable weight has been relatively stable does not complaining of any shortness of breath-he is on potassium-Will update a metabolic panel to ensure stability of electrolyte--he is on digoxin as well as beta blocker--update digoxin level  [phone]

## 2013-05-06 LAB — GLUCOSE, CAPILLARY
GLUCOSE-CAPILLARY: 138 mg/dL — AB (ref 70–99)
GLUCOSE-CAPILLARY: 190 mg/dL — AB (ref 70–99)
Glucose-Capillary: 130 mg/dL — ABNORMAL HIGH (ref 70–99)

## 2013-05-07 LAB — GLUCOSE, CAPILLARY
GLUCOSE-CAPILLARY: 161 mg/dL — AB (ref 70–99)
Glucose-Capillary: 156 mg/dL — ABNORMAL HIGH (ref 70–99)

## 2013-05-08 LAB — GLUCOSE, CAPILLARY: Glucose-Capillary: 171 mg/dL — ABNORMAL HIGH (ref 70–99)

## 2013-05-09 LAB — GLUCOSE, CAPILLARY: Glucose-Capillary: 143 mg/dL — ABNORMAL HIGH (ref 70–99)

## 2013-05-10 LAB — GLUCOSE, CAPILLARY
GLUCOSE-CAPILLARY: 200 mg/dL — AB (ref 70–99)
GLUCOSE-CAPILLARY: 156 mg/dL — AB (ref 70–99)
Glucose-Capillary: 122 mg/dL — ABNORMAL HIGH (ref 70–99)

## 2013-05-11 LAB — GLUCOSE, CAPILLARY
GLUCOSE-CAPILLARY: 135 mg/dL — AB (ref 70–99)
Glucose-Capillary: 174 mg/dL — ABNORMAL HIGH (ref 70–99)

## 2013-05-12 LAB — GLUCOSE, CAPILLARY
GLUCOSE-CAPILLARY: 139 mg/dL — AB (ref 70–99)
Glucose-Capillary: 177 mg/dL — ABNORMAL HIGH (ref 70–99)

## 2013-05-13 ENCOUNTER — Ambulatory Visit (HOSPITAL_COMMUNITY): Payer: Medicare Other | Attending: Internal Medicine

## 2013-05-13 DIAGNOSIS — E119 Type 2 diabetes mellitus without complications: Secondary | ICD-10-CM | POA: Insufficient documentation

## 2013-05-13 DIAGNOSIS — R05 Cough: Secondary | ICD-10-CM | POA: Diagnosis present

## 2013-05-13 DIAGNOSIS — R0989 Other specified symptoms and signs involving the circulatory and respiratory systems: Secondary | ICD-10-CM | POA: Insufficient documentation

## 2013-05-13 DIAGNOSIS — J449 Chronic obstructive pulmonary disease, unspecified: Secondary | ICD-10-CM | POA: Diagnosis not present

## 2013-05-13 DIAGNOSIS — I2510 Atherosclerotic heart disease of native coronary artery without angina pectoris: Secondary | ICD-10-CM | POA: Insufficient documentation

## 2013-05-13 DIAGNOSIS — R059 Cough, unspecified: Secondary | ICD-10-CM | POA: Insufficient documentation

## 2013-05-13 DIAGNOSIS — Z951 Presence of aortocoronary bypass graft: Secondary | ICD-10-CM | POA: Insufficient documentation

## 2013-05-13 DIAGNOSIS — I10 Essential (primary) hypertension: Secondary | ICD-10-CM | POA: Diagnosis not present

## 2013-05-13 DIAGNOSIS — F17200 Nicotine dependence, unspecified, uncomplicated: Secondary | ICD-10-CM | POA: Diagnosis not present

## 2013-05-13 DIAGNOSIS — J4489 Other specified chronic obstructive pulmonary disease: Secondary | ICD-10-CM | POA: Insufficient documentation

## 2013-05-13 LAB — GLUCOSE, CAPILLARY
Glucose-Capillary: 174 mg/dL — ABNORMAL HIGH (ref 70–99)
Glucose-Capillary: 228 mg/dL — ABNORMAL HIGH (ref 70–99)

## 2013-05-14 ENCOUNTER — Other Ambulatory Visit: Payer: Self-pay

## 2013-05-14 LAB — GLUCOSE, CAPILLARY: Glucose-Capillary: 199 mg/dL — ABNORMAL HIGH (ref 70–99)

## 2013-05-14 MED ORDER — MORPHINE SULFATE (CONCENTRATE) 10 MG /0.5 ML PO SOLN: ORAL | Status: DC

## 2013-05-14 MED ORDER — MORPHINE SULFATE (CONCENTRATE) 10 MG /0.5 ML PO SOLN: ORAL | Status: AC

## 2013-06-01 NOTE — Telephone Encounter (Signed)
Arlo called first thing this am indicating he is off today and asked if Hassell Done, NP could handle rx request for hardcopy of Roxanol.  Shortly after Alene Mires called, Inez Catalina from [facility] called to give full details of request. RX request for Roxanol 5 mg by mouth every 2 hours as needed to be faxed to [phone]  Rx printed and given to Janett Billow to sign and then to be faxed.

## 2013-06-01 DEATH — deceased

## 2014-11-03 IMAGING — CR DG CHEST 1V PORT
1 series · 1 of 1 positions shown · non-contrast
Comparison: CT chest 08/25/2010 and PA and lateral chest
09/13/2011.

CLINICAL DATA: Status post fall.  Tachycardia.

PORTABLE CHEST - 1 VIEW

[view not recorded]
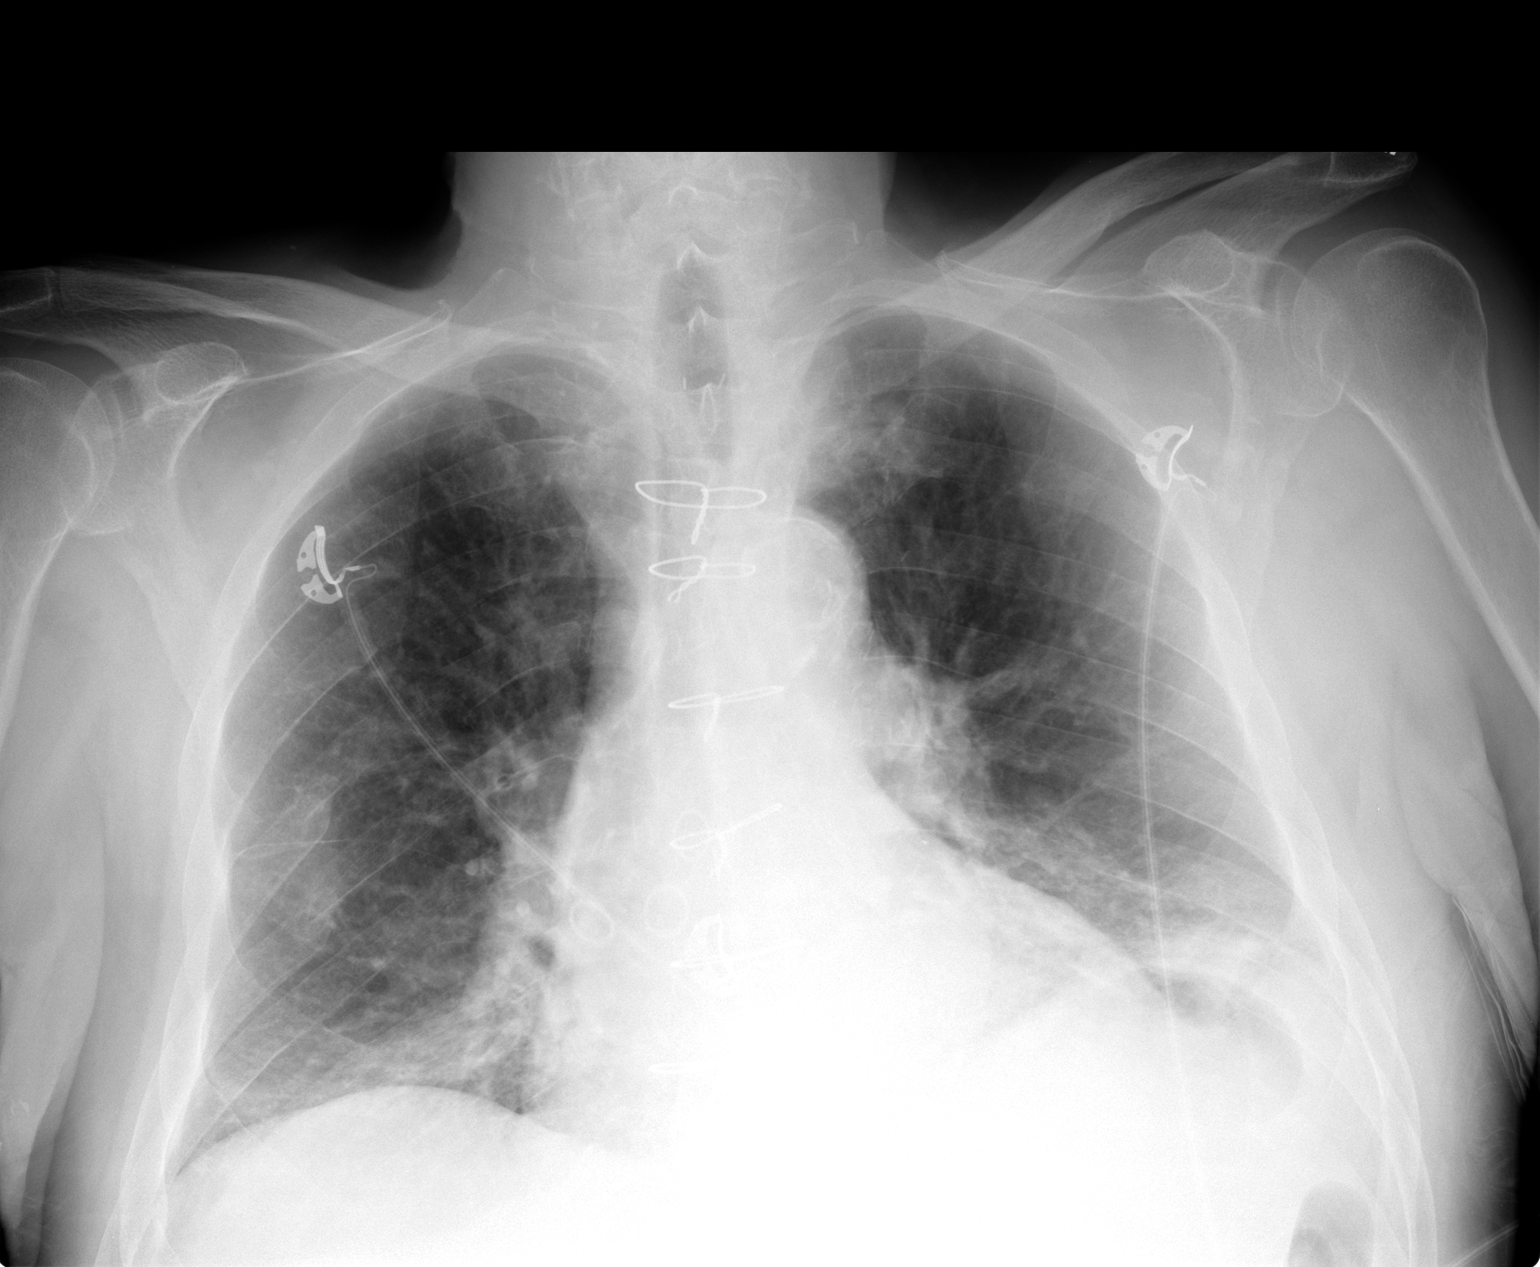

[1 of 1 positions shown; findings below may reference images not displayed]

FINDINGS: Streaky bibasilar airspace opacities are most compatible
with atelectasis.  Lungs otherwise clear.  No pneumothorax.  Heart
size normal.  Remote right rib fractures noted.
IMPRESSION: Bibasilar atelectasis.  No acute abnormality.

## 2014-11-03 IMAGING — CR DG SHOULDER 2+V*L*
3 series · 3 of 3 positions shown · non-contrast
Comparison: Single view of the left shoulder 06/18/2005.

CLINICAL DATA: Left shoulder pain after fall.

LEFT SHOULDER - 2+ VIEW

[view not recorded (1 of 3)]
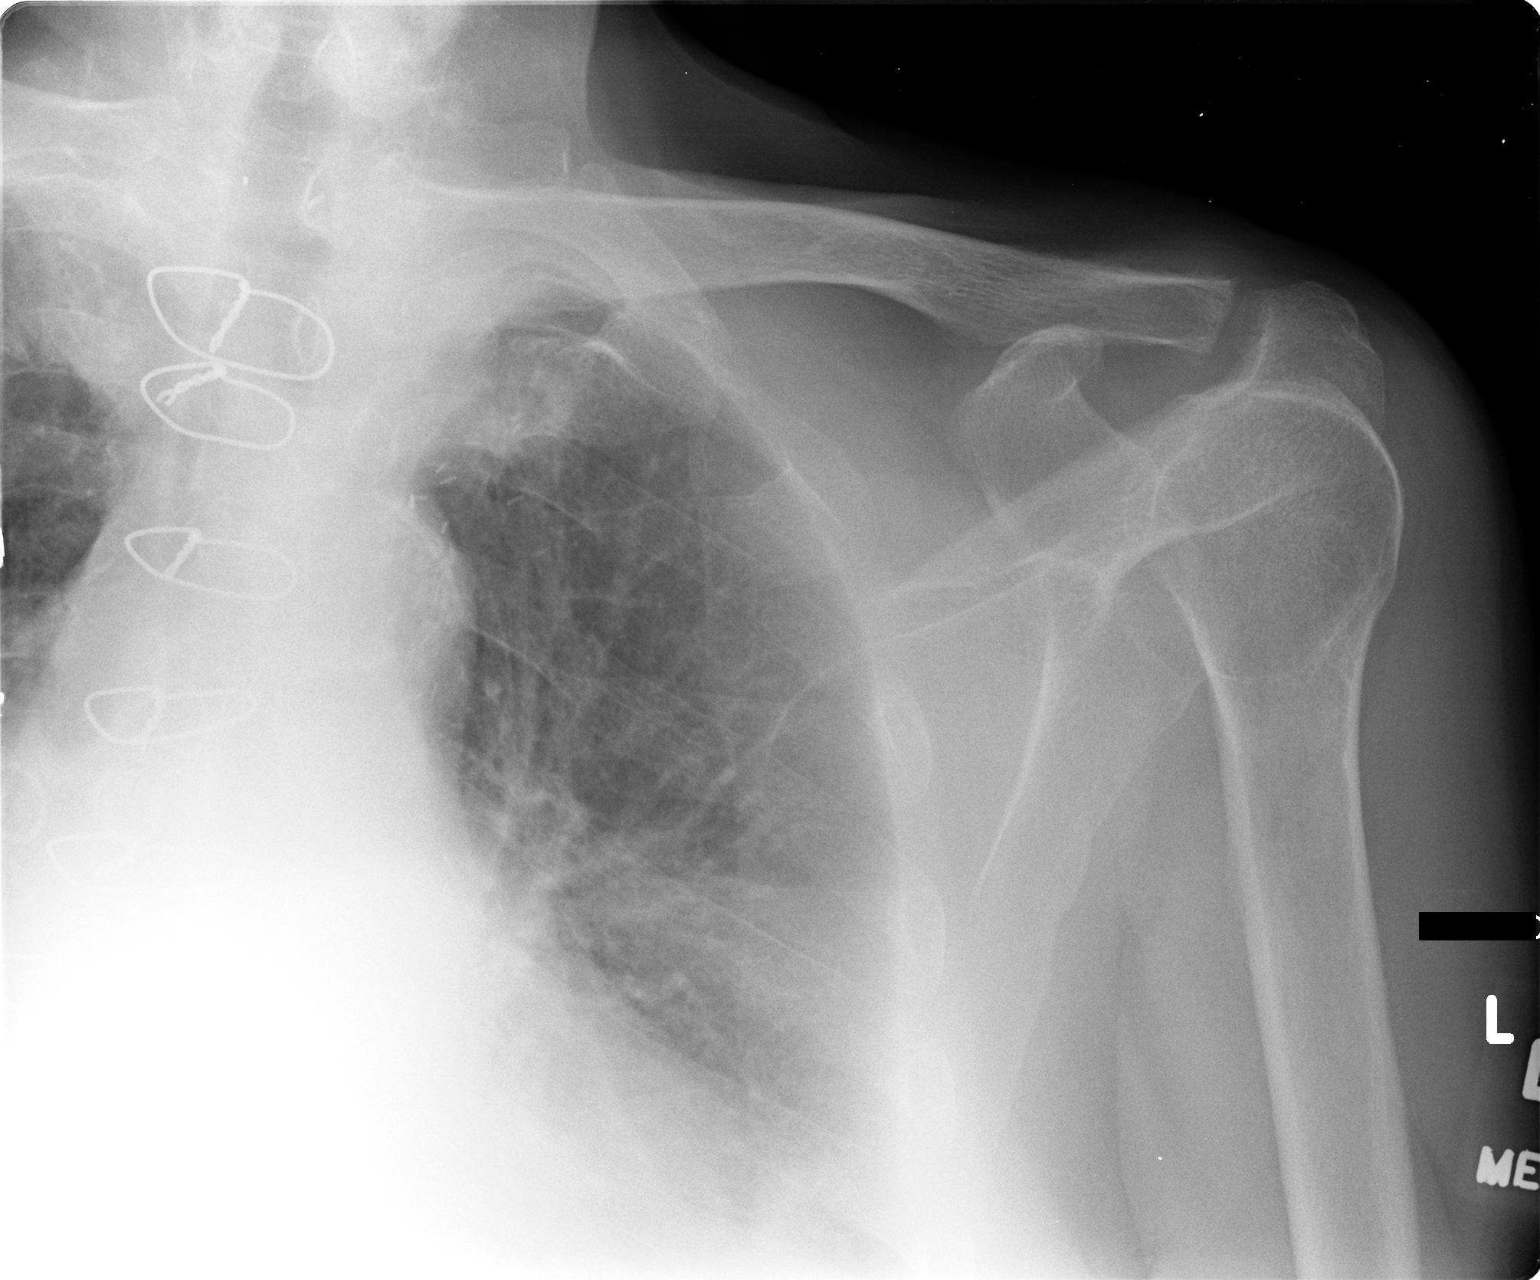

[view not recorded (2 of 3)]
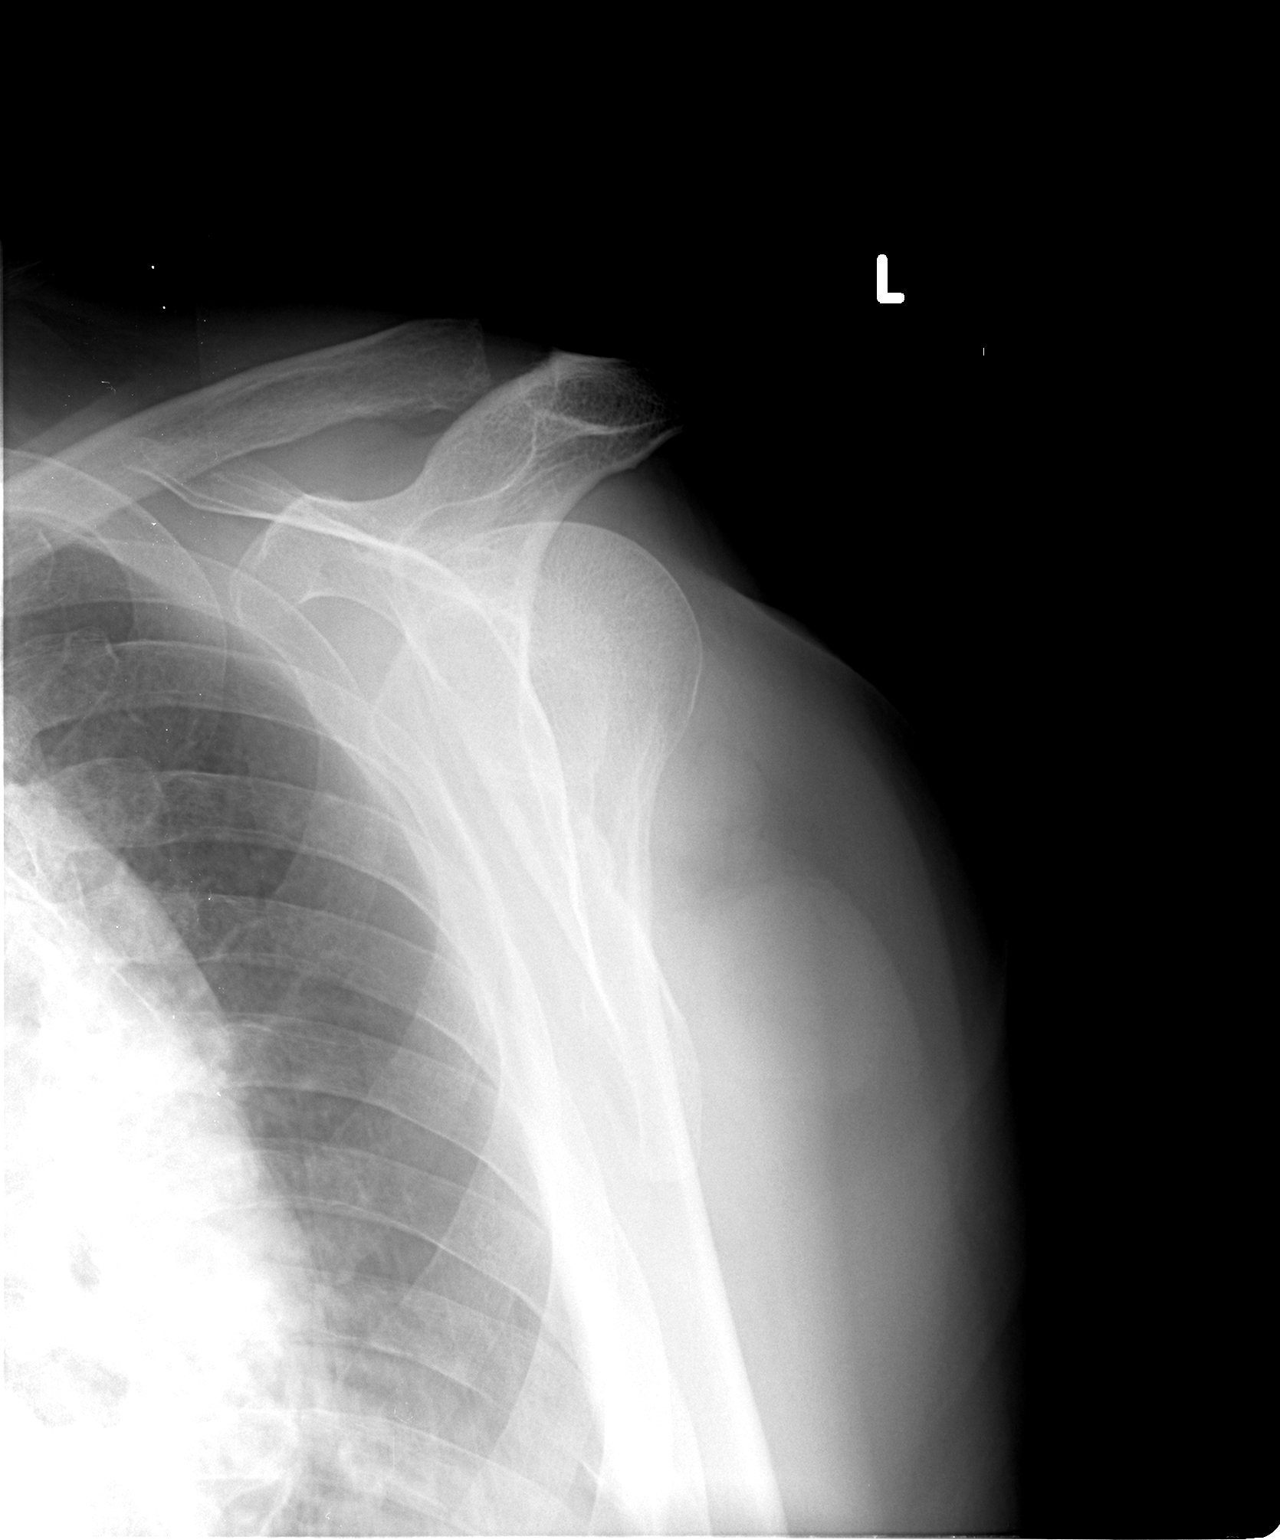

[view not recorded (3 of 3)]
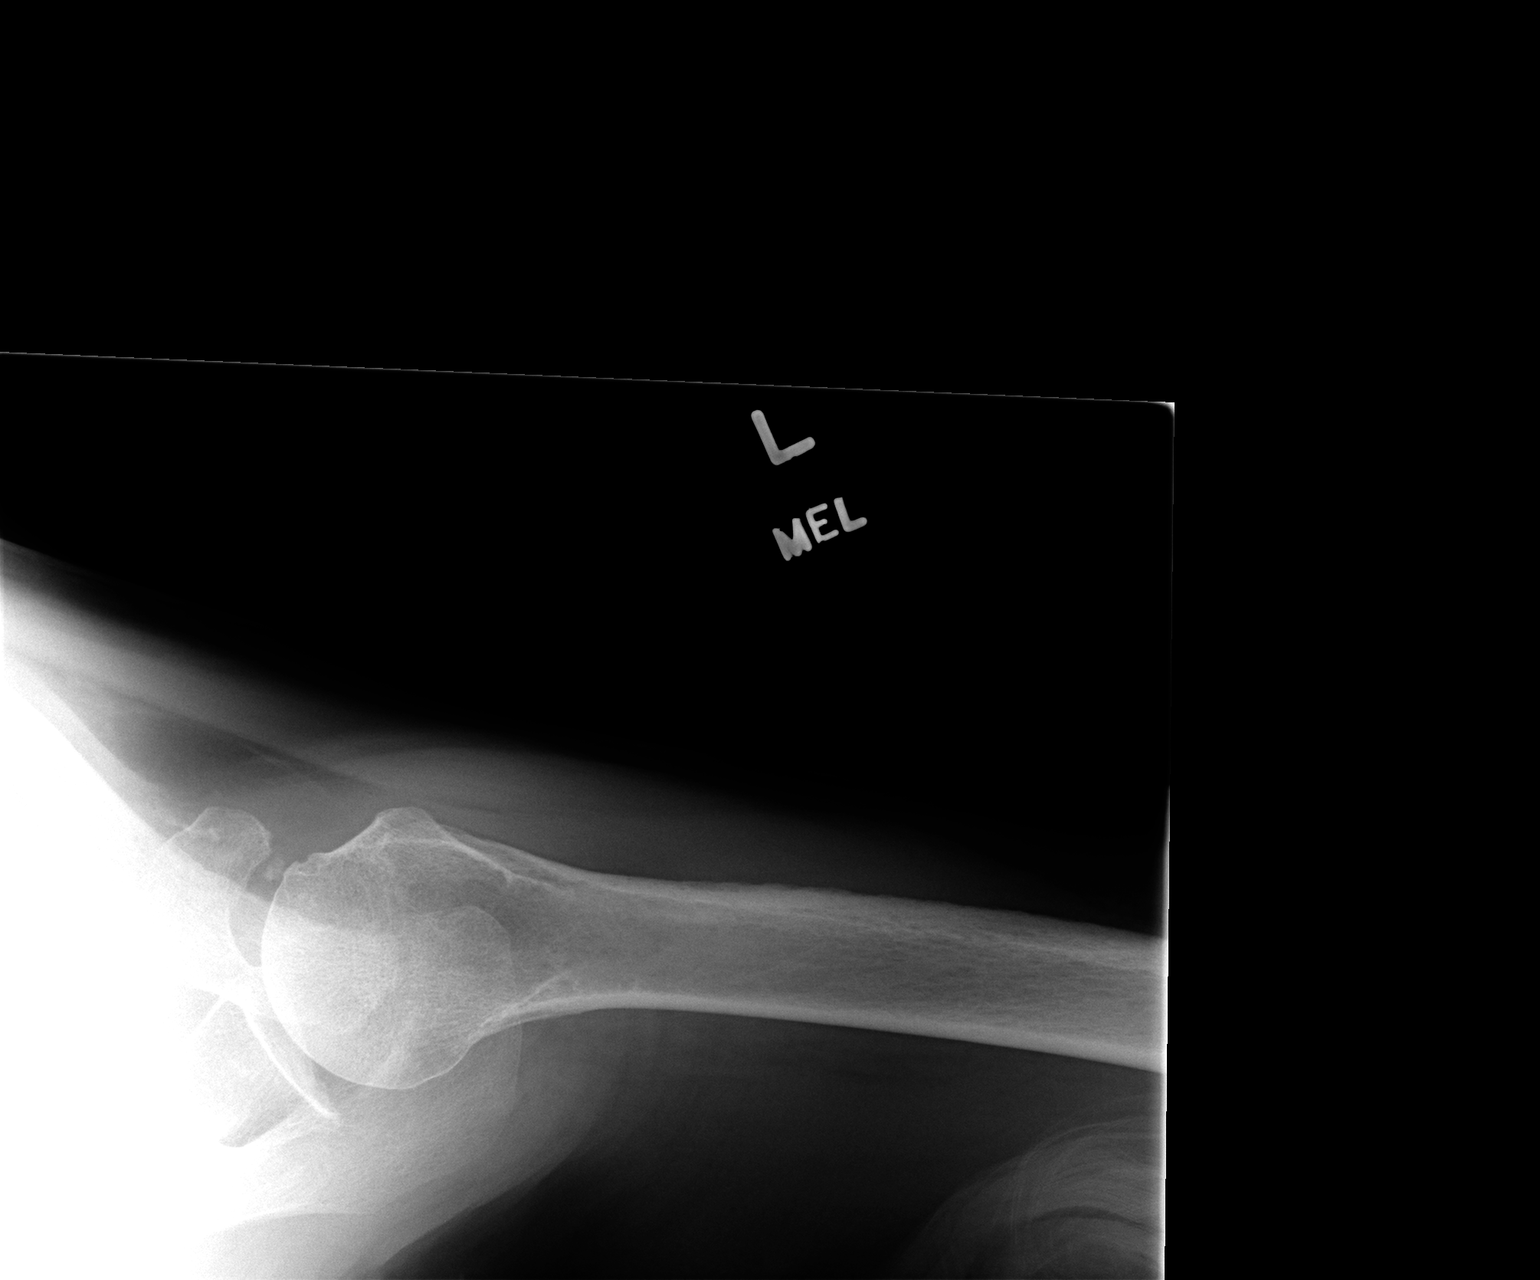

[3 of 3 positions shown; findings below may reference images not displayed]

FINDINGS: The humerus is located and the acromioclavicular joint is
intact with some degenerative change noted.  There is no fracture.
Imaged left lung and ribs are unremarkable.
IMPRESSION: No acute abnormality.

## 2015-08-02 IMAGING — CR DG ABDOMEN 1V
1 series · 1 of 1 positions shown · non-contrast
Comparison: Lumbar spine radiographs performed 11/16/2010

CLINICAL DATA: Constipation, abdominal pain and cramping.

EXAM:
ABDOMEN - 1 VIEW

[view not recorded]
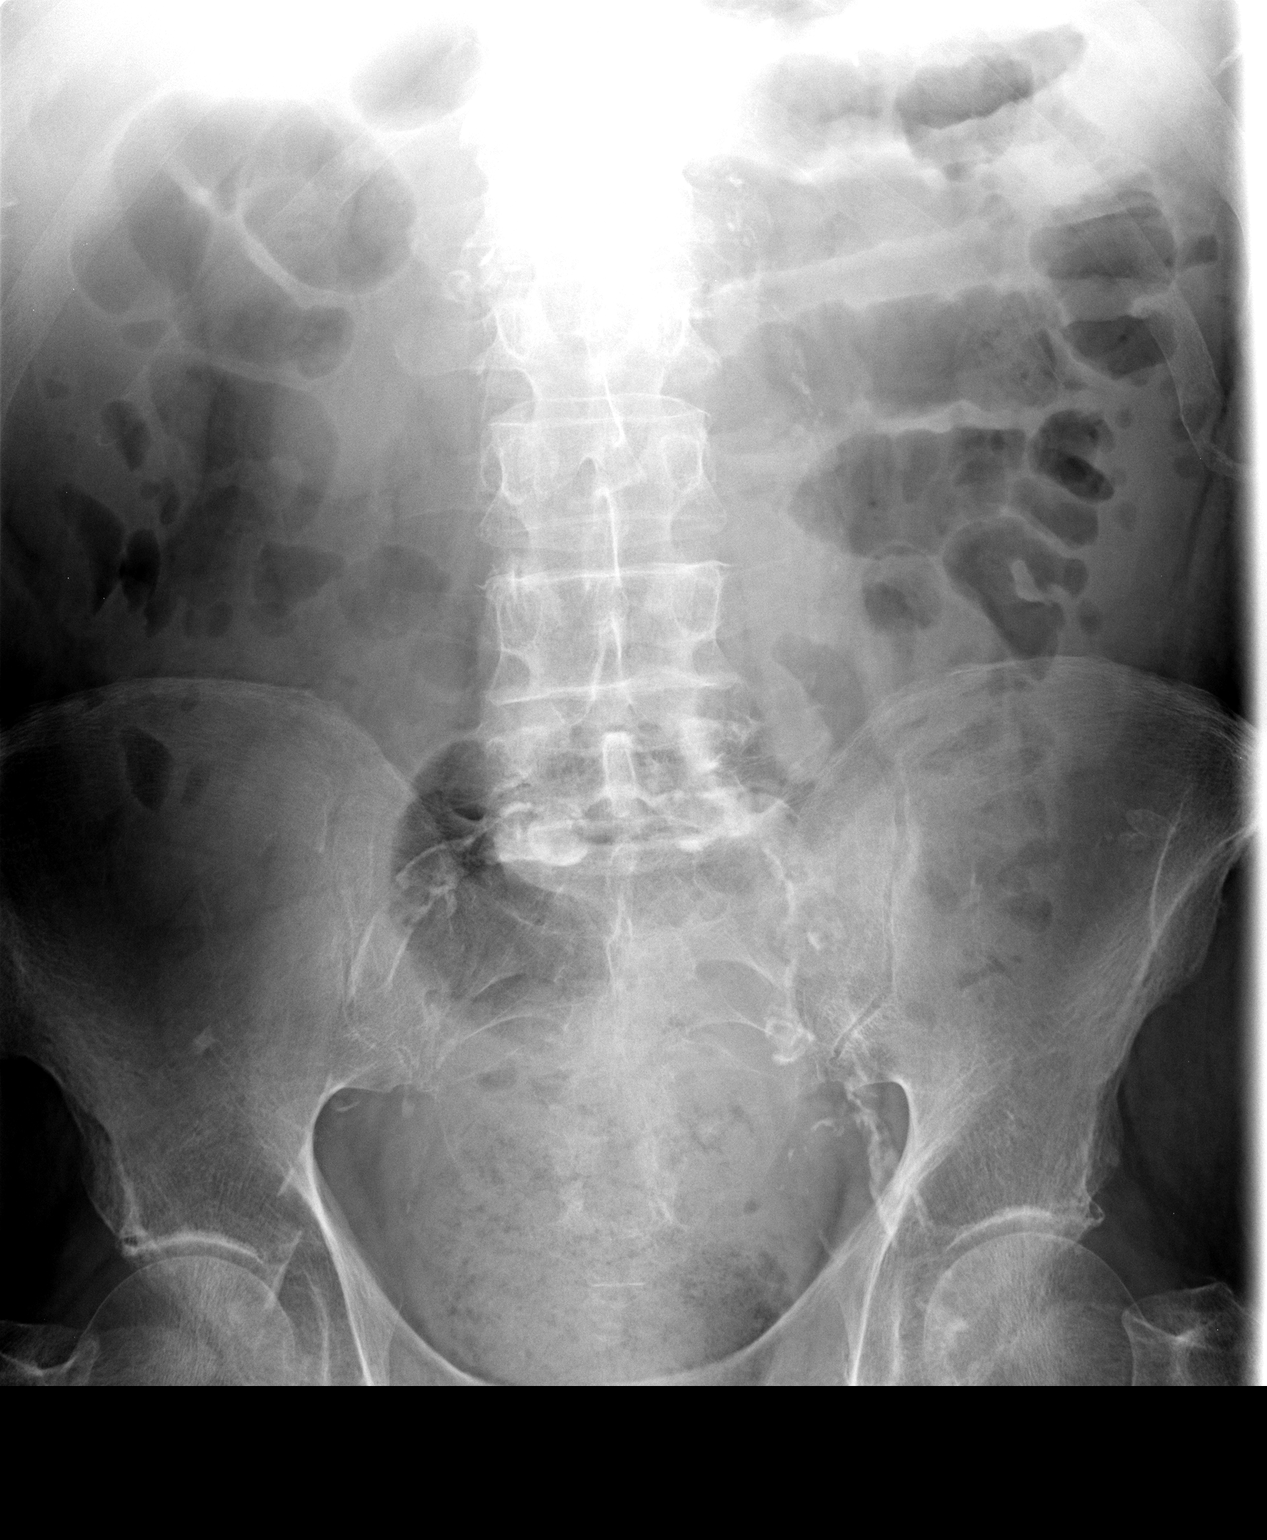

[1 of 1 positions shown; findings below may reference images not displayed]

FINDINGS: The visualized bowel gas pattern is unremarkable. Scattered air and
stool filled loops of colon are seen; no abnormal dilatation of
small bowel loops is seen to suggest small bowel obstruction. No
free intra-abdominal air is identified, though evaluation for free
air is limited on a single supine view.

The rectum is distended with stool, measuring 10.2 cm in transverse
dimension, raising concern for fecal impaction.

The visualized osseous structures are within normal limits; the
sacroiliac joints are unremarkable in appearance. Scattered vascular
calcifications are seen.
IMPRESSION: 1. Rectum distended with stool to 10.2 cm in transverse dimension,
raising concern for fecal impaction.
2. Relatively small amount of stool noted in the remainder of the
colon. Unremarkable bowel gas pattern; no free intra-abdominal air
seen.
